# Patient Record
Sex: Female | Born: 1965 | Race: White | Hispanic: No | Marital: Married | Smoking: Never smoker
Health system: Southern US, Community
[De-identification: ages and names within clinical notes are randomized; demographics above are authoritative.]

## PROBLEM LIST (undated history)

## (undated) DIAGNOSIS — M1990 Unspecified osteoarthritis, unspecified site: Secondary | ICD-10-CM

## (undated) DIAGNOSIS — R740 Nonspecific elevation of levels of transaminase and lactic acid dehydrogenase [LDH]: Secondary | ICD-10-CM

## (undated) DIAGNOSIS — I10 Essential (primary) hypertension: Secondary | ICD-10-CM

## (undated) DIAGNOSIS — E785 Hyperlipidemia, unspecified: Secondary | ICD-10-CM

## (undated) DIAGNOSIS — J45909 Unspecified asthma, uncomplicated: Secondary | ICD-10-CM

## (undated) DIAGNOSIS — K219 Gastro-esophageal reflux disease without esophagitis: Secondary | ICD-10-CM

## (undated) DIAGNOSIS — E669 Obesity, unspecified: Secondary | ICD-10-CM

## (undated) DIAGNOSIS — R7402 Elevation of levels of lactic acid dehydrogenase (LDH): Secondary | ICD-10-CM

## (undated) DIAGNOSIS — M3130 Wegener's granulomatosis without renal involvement: Secondary | ICD-10-CM

## (undated) DIAGNOSIS — R7401 Elevation of levels of liver transaminase levels: Secondary | ICD-10-CM

## (undated) DIAGNOSIS — F54 Psychological and behavioral factors associated with disorders or diseases classified elsewhere: Secondary | ICD-10-CM

## (undated) DIAGNOSIS — H35039 Hypertensive retinopathy, unspecified eye: Secondary | ICD-10-CM

## (undated) DIAGNOSIS — J309 Allergic rhinitis, unspecified: Secondary | ICD-10-CM

## (undated) DIAGNOSIS — F419 Anxiety disorder, unspecified: Secondary | ICD-10-CM

## (undated) DIAGNOSIS — E559 Vitamin D deficiency, unspecified: Secondary | ICD-10-CM

## (undated) DIAGNOSIS — N189 Chronic kidney disease, unspecified: Secondary | ICD-10-CM

## (undated) DIAGNOSIS — D649 Anemia, unspecified: Secondary | ICD-10-CM

## (undated) DIAGNOSIS — E039 Hypothyroidism, unspecified: Secondary | ICD-10-CM

## (undated) DIAGNOSIS — H34829 Venous engorgement, unspecified eye: Secondary | ICD-10-CM

## (undated) HISTORY — DX: Essential (primary) hypertension: I10

## (undated) HISTORY — DX: Chronic kidney disease, unspecified: N18.9

## (undated) HISTORY — DX: Anemia, unspecified: D64.9

## (undated) HISTORY — DX: Gastro-esophageal reflux disease without esophagitis: K21.9

## (undated) HISTORY — PX: OTHER SURGICAL HISTORY: SHX169

## (undated) HISTORY — DX: Vitamin D deficiency, unspecified: E55.9

## (undated) HISTORY — DX: Anxiety disorder, unspecified: F41.9

## (undated) HISTORY — PX: TONSILLECTOMY: SUR1361

## (undated) HISTORY — DX: Unspecified asthma, uncomplicated: J45.909

## (undated) HISTORY — DX: Wegener's granulomatosis without renal involvement: M31.30

## (undated) HISTORY — DX: Elevation of levels of lactic acid dehydrogenase (LDH): R74.02

## (undated) HISTORY — DX: Psychological and behavioral factors associated with disorders or diseases classified elsewhere: F54

## (undated) HISTORY — DX: Unspecified osteoarthritis, unspecified site: M19.90

## (undated) HISTORY — DX: Nonspecific elevation of levels of transaminase and lactic acid dehydrogenase (ldh): R74.0

## (undated) HISTORY — DX: Obesity, unspecified: E66.9

## (undated) HISTORY — DX: Hypertensive retinopathy, unspecified eye: H35.039

## (undated) HISTORY — DX: Venous engorgement, unspecified eye: H34.829

## (undated) HISTORY — DX: Allergic rhinitis, unspecified: J30.9

## (undated) HISTORY — DX: Elevation of levels of liver transaminase levels: R74.01

## (undated) HISTORY — DX: Hypothyroidism, unspecified: E03.9

## (undated) HISTORY — DX: Hyperlipidemia, unspecified: E78.5

---

## 1997-11-24 HISTORY — PX: RENAL BIOPSY: SHX156

## 1997-11-24 HISTORY — PX: LUNG BIOPSY: SHX232

## 2005-02-26 ENCOUNTER — Emergency Department: Payer: Self-pay | Admitting: Emergency Medicine

## 2005-02-26 ENCOUNTER — Other Ambulatory Visit: Payer: Self-pay

## 2005-04-14 ENCOUNTER — Ambulatory Visit: Payer: Self-pay | Admitting: Internal Medicine

## 2006-06-17 ENCOUNTER — Ambulatory Visit: Payer: Self-pay | Admitting: Unknown Physician Specialty

## 2006-06-22 ENCOUNTER — Encounter: Payer: Self-pay | Admitting: Cardiovascular Disease

## 2006-07-01 ENCOUNTER — Ambulatory Visit: Payer: Self-pay | Admitting: Unknown Physician Specialty

## 2006-07-06 ENCOUNTER — Ambulatory Visit: Payer: Self-pay | Admitting: Family Medicine

## 2006-07-25 ENCOUNTER — Ambulatory Visit: Payer: Self-pay | Admitting: Family Medicine

## 2006-08-24 ENCOUNTER — Ambulatory Visit: Payer: Self-pay | Admitting: Family Medicine

## 2006-09-24 ENCOUNTER — Ambulatory Visit: Payer: Self-pay | Admitting: Family Medicine

## 2008-02-16 ENCOUNTER — Ambulatory Visit: Payer: Self-pay

## 2008-02-28 ENCOUNTER — Ambulatory Visit: Payer: Self-pay

## 2009-02-27 ENCOUNTER — Ambulatory Visit: Payer: Self-pay | Admitting: Family Medicine

## 2009-07-05 ENCOUNTER — Ambulatory Visit: Payer: Self-pay

## 2009-09-24 ENCOUNTER — Ambulatory Visit: Payer: Self-pay | Admitting: Family Medicine

## 2009-10-24 HISTORY — PX: OTHER SURGICAL HISTORY: SHX169

## 2009-10-30 ENCOUNTER — Other Ambulatory Visit: Payer: Self-pay | Admitting: Ophthalmology

## 2010-06-14 ENCOUNTER — Ambulatory Visit: Payer: Self-pay

## 2010-08-06 ENCOUNTER — Encounter: Payer: Self-pay | Admitting: Cardiovascular Disease

## 2010-09-03 ENCOUNTER — Ambulatory Visit: Payer: Self-pay | Admitting: Unknown Physician Specialty

## 2010-09-10 ENCOUNTER — Ambulatory Visit: Payer: Self-pay | Admitting: Unknown Physician Specialty

## 2010-10-02 ENCOUNTER — Ambulatory Visit: Payer: Self-pay | Admitting: Family Medicine

## 2010-11-07 ENCOUNTER — Encounter: Payer: Self-pay | Admitting: Cardiovascular Disease

## 2010-11-22 ENCOUNTER — Ambulatory Visit: Payer: Self-pay | Admitting: Family Medicine

## 2010-11-24 LAB — HM COLONOSCOPY

## 2010-11-28 ENCOUNTER — Ambulatory Visit
Admission: RE | Admit: 2010-11-28 | Discharge: 2010-11-28 | Payer: Self-pay | Source: Home / Self Care | Attending: Cardiovascular Disease | Admitting: Cardiovascular Disease

## 2010-11-28 ENCOUNTER — Encounter: Payer: Self-pay | Admitting: Cardiovascular Disease

## 2010-11-28 DIAGNOSIS — R079 Chest pain, unspecified: Secondary | ICD-10-CM | POA: Insufficient documentation

## 2010-12-26 NOTE — Assessment & Plan Note (Signed)
Summary: NP6/AMD   Visit Type:  Initial Consult Primary Provider:  Dr. Nilda Simmer  CC:  c/o chest pain. Denies SOB and palpitations.  History of Present Illness: Rachel Walls is a very pleasant 45 year old woman with a history of Wegener's granulomatosis, diabetes, obesity who presents for evaluation of left-sided chest pain.  She reports that she had chest pain several years ago. She was seen at [facility] cardiology and had a echocardiogram that showed very small bowel leaking. She also had a stress test at [facility] in 2008 which was reportedly normal.  She describes her chest pain as 2-3/10 in intensity, lasting for minutes to up to 30 minutes at a time, a dull ache on the left chest above the left breast that comes on at rest as well as sometimes with walking. It has not been getting worse and has been relatively stable.   She is more worried about her left-sided chest pain as she was told that she had an abnormal EKG.  EKG from today shows normal sinus rhythm with rate 91 beats per minute with no significant ST or T wave changes. She does have appropriate R-wave progression through the precordial leads.  old EKG from September 2011 shows normal sinus rhythm with rate of 97 beats per minute, poor R wave progression in the precordial leads, otherwise essentially normal. Borderline Q waves in inferior leads are not seen on today's EKG      Current Medications (verified): 1)  Metformin Hcl 500 Mg Tabs (Metformin Hcl) .Marland Kitchen.. 1 Tablet Every Evening With Food 2)  Prevacid 30 Mg Cpdr (Lansoprazole) .Marland Kitchen.. 1 Packet Every Day 3)  Altace 10 Mg Caps (Ramipril) .Marland Kitchen.. 1 By Mouth Once Daily 4)  Flonase 50 Mcg/act Susp (Fluticasone Propionate) .... 2 Sprays Each Nostril Daily 5)  Loratadine 10 Mg Tabs (Loratadine) .Marland Kitchen.. 1 Tablet Every Day For Allergies 6)  Cellcept 250 Mg Caps (Mycophenolate Mofetil) .Marland Kitchen.. 1 Tablet At Bedtime 7)  Cellcept 500 Mg Tabs (Mycophenolate Mofetil) .Marland Kitchen.. 1 Tablet Every Morning 8)   Levothroid 88 Mcg Tabs (Levothyroxine Sodium) .Marland Kitchen.. 1 Tablet By Mouth Every Day 9)  Symbicort 80-4.5 Mcg/act Aero (Budesonide-Formoterol Fumarate) .... 2 Puffs Two Times A Day 10)  Aleve 220 Mg Tabs (Naproxen Sodium) .... As Needed  Allergies (verified): 1)  ! Penicillin 2)  ! 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  Alcohol Use - no Regular Exercise - no Drug Use - no  Risk Factors: Exercise: no (11/28/2010)  Risk Factors: Smoking Status: never (11/28/2010)  Review of Systems       The patient complains of weight gain and chest pain.  The patient denies fever, weight loss, vision loss, decreased hearing, hoarseness, syncope, dyspnea on exertion, peripheral edema, prolonged cough, abdominal pain, incontinence, muscle weakness, depression, and enlarged lymph nodes.    Vital Signs:  Patient profile:   45 year old female Height:      65 inches Weight:      275.75 pounds BMI:     46.05  Pulse rate:   91 / minute BP sitting:   118 / 68  (left arm) Cuff size:   regular  Vitals Entered By: Lysbeth Galas CMA (November 28, 2010 3:58 PM)   Physical Exam  General:  Well developed, well nourished, in no acute distress. obese Head:  normocephalic and atraumatic Neck:  Neck supple, no JVD. No masses, thyromegaly or abnormal cervical  nodes. Lungs:  Clear bilaterally to auscultation and percussion. Heart:  Non-displaced PMI, chest non-tender; regular rate and rhythm, S1, S2 without murmurs, rubs or gallops. Carotid upstroke normal, no bruit.  Pedals normal pulses. No edema, no varicosities. Abdomen:  Bowel sounds positive; abdomen soft and non-tender without masses Msk:  Back normal, normal gait. Muscle strength and tone normal. Pulses:  pulses normal in all 4 extremities Extremities:  No clubbing or cyanosis. Neurologic:  Alert and oriented x 3. Skin:  Intact without lesions or rashes. Psych:  Normal affect.   Impression & Recommendations:  Problem # 1:  CHEST PAIN UNSPECIFIED (ICD-786.50) chest pain is very atypical in nature. Her EKG is essentially normal on today's visit with nonspecific likely normal changes on previous EKGs. She did have a previous stress test for similar type symptoms in 2008. She is relatively asymptomatic with exertion with no other associated symptoms. We did offer a treadmill study at any point in the future if her symptoms get worse, particularly with exertion. She will call us if she becomes more concerned or has worsening symptoms. She may have a family history of coronary artery disease. Her mother is starting a statin. We'll try to obtain her cholesterol for our review.  I do not think that her Wegener's granulomatosis is playing a role in her left upper chest discomfort, though this is not clear. She is on chronic immunosuppression.  I encouraged her to continue to work on her diet and exercise. we would be happy to see her back on an as needed basis. Thank you for allowing [place] to participate in the care of Rachel Walls.   Her updated medication list for this problem includes:    Altace 10 Mg Caps (Ramipril) .Marland Kitchen... 1 by mouth once daily  Orders: EKG w/ Interpretation (93000)

## 2011-01-01 NOTE — Letter (Signed)
Summary: [facility]  [facility]   Imported By: Marylou Mccoy 12/24/2010 09:45:48  _____________________________________________________________________  External Attachment:    Type:   Image     Comment:   External Document

## 2011-01-16 ENCOUNTER — Encounter: Payer: Self-pay | Admitting: Cardiovascular Disease

## 2011-01-21 NOTE — Letter (Signed)
Summary: [employer] at [facility] [address]   [place], [place] [address]   Phone: [phone]  Fax: [phone]    01/16/2011    [facility] [address] [place], [place]  [address]   Dear Ms. [facility],   Ms. Moroni after reviewing your medical records you are due to have a cholesterol check.  If you would contact our office at [phone] to have the cholesterol scheduled.           Sincerely,    Julien Nordmann, M.D., PHD.

## 2011-06-10 ENCOUNTER — Encounter: Payer: Self-pay | Admitting: Cardiovascular Disease

## 2011-11-04 ENCOUNTER — Ambulatory Visit: Payer: Self-pay | Admitting: Family Medicine

## 2012-01-03 ENCOUNTER — Emergency Department: Payer: Self-pay | Admitting: Emergency Medicine

## 2012-05-17 ENCOUNTER — Ambulatory Visit: Payer: Self-pay | Admitting: Family Medicine

## 2012-05-17 HISTORY — PX: OTHER SURGICAL HISTORY: SHX169

## 2012-06-10 ENCOUNTER — Ambulatory Visit: Payer: Self-pay | Admitting: Family Medicine

## 2012-06-10 HISTORY — PX: OTHER SURGICAL HISTORY: SHX169

## 2012-06-11 ENCOUNTER — Ambulatory Visit: Payer: Self-pay | Admitting: Family Medicine

## 2012-06-11 LAB — HM MAMMOGRAPHY: HM Mammogram: NEGATIVE

## 2012-08-19 ENCOUNTER — Other Ambulatory Visit (HOSPITAL_COMMUNITY): Payer: Self-pay | Admitting: Gastroenterology

## 2012-08-19 DIAGNOSIS — R109 Unspecified abdominal pain: Secondary | ICD-10-CM

## 2012-08-19 HISTORY — PX: ESOPHAGOGASTRODUODENOSCOPY: SHX1529

## 2012-08-27 ENCOUNTER — Encounter (HOSPITAL_COMMUNITY): Payer: Self-pay

## 2012-08-27 ENCOUNTER — Encounter (HOSPITAL_COMMUNITY)
Admission: RE | Admit: 2012-08-27 | Discharge: 2012-08-27 | Disposition: A | Discharge status: Home / Self Care | Payer: BC Managed Care – PPO | Source: Ambulatory Visit | Attending: Gastroenterology | Admitting: Gastroenterology

## 2012-08-27 DIAGNOSIS — R1011 Right upper quadrant pain: Secondary | ICD-10-CM | POA: Insufficient documentation

## 2012-08-27 DIAGNOSIS — R109 Unspecified abdominal pain: Secondary | ICD-10-CM

## 2012-08-27 DIAGNOSIS — R112 Nausea with vomiting, unspecified: Secondary | ICD-10-CM | POA: Insufficient documentation

## 2012-08-27 HISTORY — PX: OTHER SURGICAL HISTORY: SHX169

## 2012-08-27 MED ORDER — TECHNETIUM TC 99M MEBROFENIN IV KIT: 5.5000 | PACK | Freq: Once | INTRAVENOUS | Status: AC | PRN

## 2012-09-09 ENCOUNTER — Encounter: Payer: Self-pay | Admitting: *Deleted

## 2012-09-13 ENCOUNTER — Ambulatory Visit (INDEPENDENT_AMBULATORY_CARE_PROVIDER_SITE_OTHER): Payer: BC Managed Care – PPO | Admitting: Family Medicine

## 2012-09-13 ENCOUNTER — Encounter: Payer: Self-pay | Admitting: Family Medicine

## 2012-09-13 VITALS — BP 108/76 | HR 79 | Temp 98.9°F | Resp 16 | Ht 65.0 in | Wt 248.0 lb

## 2012-09-13 DIAGNOSIS — E119 Type 2 diabetes mellitus without complications: Secondary | ICD-10-CM

## 2012-09-13 DIAGNOSIS — E1142 Type 2 diabetes mellitus with diabetic polyneuropathy: Secondary | ICD-10-CM | POA: Insufficient documentation

## 2012-09-13 DIAGNOSIS — E539 Vitamin B deficiency, unspecified: Secondary | ICD-10-CM

## 2012-09-13 DIAGNOSIS — K2970 Gastritis, unspecified, without bleeding: Secondary | ICD-10-CM

## 2012-09-13 DIAGNOSIS — E559 Vitamin D deficiency, unspecified: Secondary | ICD-10-CM | POA: Insufficient documentation

## 2012-09-13 DIAGNOSIS — J309 Allergic rhinitis, unspecified: Secondary | ICD-10-CM | POA: Insufficient documentation

## 2012-09-13 DIAGNOSIS — E039 Hypothyroidism, unspecified: Secondary | ICD-10-CM

## 2012-09-13 DIAGNOSIS — M3130 Wegener's granulomatosis without renal involvement: Secondary | ICD-10-CM

## 2012-09-13 DIAGNOSIS — I10 Essential (primary) hypertension: Secondary | ICD-10-CM | POA: Insufficient documentation

## 2012-09-13 DIAGNOSIS — E7800 Pure hypercholesterolemia, unspecified: Secondary | ICD-10-CM

## 2012-09-13 DIAGNOSIS — E611 Iron deficiency: Secondary | ICD-10-CM

## 2012-09-13 DIAGNOSIS — R160 Hepatomegaly, not elsewhere classified: Secondary | ICD-10-CM

## 2012-09-13 DIAGNOSIS — R1110 Vomiting, unspecified: Secondary | ICD-10-CM

## 2012-09-13 LAB — VITAMIN B12: Vitamin B-12: 577 pg/mL (ref 211–911)

## 2012-09-13 LAB — T4, FREE: Free T4: 1.35 ng/dL (ref 0.80–1.80)

## 2012-09-13 LAB — COMPREHENSIVE METABOLIC PANEL
Alkaline Phosphatase: 53 U/L (ref 39–117)
BUN: 14 mg/dL (ref 6–23)
CO2: 28 mEq/L (ref 19–32)
Glucose, Bld: 113 mg/dL — ABNORMAL HIGH (ref 70–99)
Sodium: 139 mEq/L (ref 135–145)
Total Bilirubin: 0.5 mg/dL (ref 0.3–1.2)
Total Protein: 6.8 g/dL (ref 6.0–8.3)

## 2012-09-13 LAB — TSH: TSH: 3.475 u[IU]/mL (ref 0.350–4.500)

## 2012-09-13 LAB — CBC WITH DIFFERENTIAL/PLATELET
Basophils Absolute: 0 10*3/uL (ref 0.0–0.1)
Basophils Relative: 0 % (ref 0–1)
Eosinophils Absolute: 0.6 10*3/uL (ref 0.0–0.7)
Eosinophils Relative: 6 % — ABNORMAL HIGH (ref 0–5)
HCT: 37.1 % (ref 36.0–46.0)
Hemoglobin: 12.2 g/dL (ref 12.0–15.0)
MCH: 24.5 pg — ABNORMAL LOW (ref 26.0–34.0)
MCHC: 32.9 g/dL (ref 30.0–36.0)
MCV: 74.5 fL — ABNORMAL LOW (ref 78.0–100.0)
Monocytes Absolute: 0.8 10*3/uL (ref 0.1–1.0)
Monocytes Relative: 8 % (ref 3–12)
Neutro Abs: 6.5 10*3/uL (ref 1.7–7.7)
RDW: 14.8 % (ref 11.5–15.5)

## 2012-09-13 LAB — LIPID PANEL
Cholesterol: 184 mg/dL (ref 0–200)
HDL: 39 mg/dL — ABNORMAL LOW (ref >39)
Triglycerides: 163 mg/dL — ABNORMAL HIGH (ref <150)
VLDL: 33 mg/dL (ref 0–40)

## 2012-09-13 MED ORDER — RAMIPRIL 10 MG PO CAPS: 10.0000 mg | ORAL_CAPSULE | Freq: Every day | ORAL | Status: DC

## 2012-09-13 MED ORDER — FLUTICASONE PROPIONATE 50 MCG/ACT NA SUSP: 2.0000 | Freq: Every day | NASAL | Status: DC

## 2012-09-13 MED ORDER — LEVOTHYROXINE SODIUM 100 MCG PO TABS: 100.0000 ug | ORAL_TABLET | Freq: Every day | ORAL | Status: DC

## 2012-09-13 MED ORDER — METFORMIN HCL 500 MG PO TABS: 500.0000 mg | ORAL_TABLET | Freq: Every evening | ORAL | Status: DC

## 2012-09-13 NOTE — Assessment & Plan Note (Signed)
Stable; obtain labs; yet out of medication for past five days; refills provided.

## 2012-09-13 NOTE — Assessment & Plan Note (Signed)
Uncontrolled.  Twenty pound weight loss since last visit due to GI issues with diarrhea, vomiting.  Increased Metformin to 1000mg  bid one month before onset of GI symptoms; thus, will decrease Metformin to 500mg  bid to determine if current GI symptoms secondary to Metformin.  Obtain labs.  S/p flu vaccine at work.

## 2012-09-13 NOTE — Progress Notes (Signed)
[address]   [place], [place]  [address]   [phone]  Subjective:    Patient ID: Rachel Walls, female    DOB: November 28, 1965, 46 y.o.   MRN: [id number]  HPIThis 46 y.o. female presents to establish care and for follow-up:  1. Vomiting, diarrhea:  onsest 04/2012.  S/p labs normal; has episodes of vomiting, diarrhea.  S/p abdominal u/s +hepatomegaly.  S/p CT scan:  Hepatomegaly.  S/p EGD:  +gastritis; normal esophagus; normal duodenum.  Biopsies negative for H. Pylori, malignancy.  HIDA scan: normal; EF 62%.  Follow-up with Iftikhar's office; has taken Prevacid for years but wants to switch to Nexium.  Went to pick up Nexium $64; buying Prevacid for $5 with insurance.  Gallbladder studies all normal.  If has attack, present to  ED for possible evaluation by general surgery.  Lost 20 pounds in three months due to symptoms.  Worse with greasy foods, fried foods.  Breads do not bother.  Having one episode every 3-4 weeks currently.  Having abdominal pain in RUQ, epigastric regions.  2.  Hypothyroidism:  Six month follow-up; no changes to medication at last visit; compliance with medication until 5 days ago; ran out of medication; denies excessive fatigue; +weight changes; no skin or hair changes.    3.  DMII:  Three month follow-up; HgbA1c at last visit of 7.5; no changes to medication or management at last visit other than encouraging compliance with diabetic diet.  Not checking blood sugars at home.  Taking Metformin 1000mg  bid.  Has been having GI issues for past six months and has lost 20 pounds due to alterations in diet.    4.  HTN:  Three month follow-up; no changes to management made at last visit.  Report good compliance to management, good tolerance, good symptom control.  Denies chest pain, palpitations, shortness of breath, leg swelling.  Intermittent dizziness.  Does not check BP at home. Has wrist monitor at home.  5.  Vitamin D deficiency:  Six month follow-up; Vitamin D level low at  CPE in 02/2012.  6.  Vitamin B12 deficiency:  Six month follow-up; vitamin B12 level borderline low at CPE in 02/2012.  7.  Iron levels low:    Six month follow-up; iron levels slightly low at CPE in 02/2012.  No associated anemia.  8.  Sinus congestion: presented to [facility] two weeks ago for sinus pressure, ear pressure, congestion.  Prescribed Flonase with improvement.  No recent fever/chills/sweats.  No headaches; no further ear pain.  Rhinorrhea and nasal congestion improved; does not want to get too dry due to history of epistaxis warranting seven day packing.  No cough.  +PND.      Review of Systems  Constitutional: Positive for unexpected weight change. Negative for fever, diaphoresis, activity change, appetite change and fatigue.  HENT: Positive for congestion, rhinorrhea, sneezing, dental problem and postnasal drip. Negative for ear pain, nosebleeds, sore throat, trouble swallowing and voice change.   Eyes: Negative for photophobia and visual disturbance.  Respiratory: Negative for cough and shortness of breath.   Cardiovascular: Negative for chest pain, palpitations and leg swelling.  Gastrointestinal: Positive for nausea, vomiting, abdominal pain and diarrhea. Negative for constipation, blood in stool and abdominal distention.  Skin: Negative for color change, rash and wound.  Neurological: Positive for dizziness. Negative for weakness, light-headedness, numbness and headaches.    Past Medical History  Diagnosis Date  . Wegener's granulomatosis   . GERD (gastroesophageal reflux disease)   .  Hypothyroidism   . Diabetes mellitus     Type 2  . Psychic factors associated with disease     Psychogenic factors with other diseases  . Hypertension   . Nonspecific elevation of levels of transaminase or lactic acid dehydrogenase (LDH)   . Other and unspecified hyperlipidemia   . Allergic rhinitis, cause unspecified   . Unspecified vitamin D deficiency   . Obesity,  unspecified   . Hypertensive retinopathy   . Venous engorgement of retina   . Chronic kidney disease     Past Surgical History  Procedure Date  . Calcaneous bone spur surgery     x2  . Tonsillectomy   . Renal biopsy 1999  . Lung biopsy 1999  . Retinal vein occlusion 12/10    Left, vision loss    Prior to Admission medications   Medication Sig Start Date End Date Taking? Authorizing Provider  cholecalciferol (VITAMIN D) 1000 UNITS tablet Take 1,000 Units by mouth daily.   Yes Historical Provider, MD  fluticasone (FLONASE) 50 MCG/ACT nasal spray Place 2 sprays into the nose daily.     Yes Historical Provider, MD  gabapentin (NEURONTIN) 100 MG capsule Take 800 mg by mouth at bedtime.    Yes Historical Provider, MD  lansoprazole (PREVACID) 30 MG capsule Take 30 mg by mouth daily.     Yes Historical Provider, MD  levothyroxine (SYNTHROID, LEVOTHROID) 100 MCG tablet Take 1 tablet (100 mcg total) by mouth daily. 09/13/12  Yes Ethelda Chick, MD  loratadine (CLARITIN) 10 MG tablet Take 10 mg by mouth daily.     Yes Historical Provider, MD  metFORMIN (GLUCOPHAGE) 500 MG tablet Take 1 tablet (500 mg total) by mouth every evening. With food. 09/13/12  Yes Ethelda Chick, MD  Multiple Vitamin (STRESSTABS PO) Take 1 tablet by mouth daily.   Yes Historical Provider, MD  mycophenolate (CELLCEPT) 250 MG capsule Take 250 mg by mouth at bedtime. 2 tab in the morning & 1 tab at night.   Yes Historical Provider, MD  ramipril (ALTACE) 10 MG capsule Take 10 mg by mouth daily.     Yes Historical Provider, MD  budesonide-formoterol (SYMBICORT) 80-4.5 MCG/ACT inhaler Inhale 2 puffs into the lungs 2 (two) times daily.      Historical Provider, MD  meloxicam (MOBIC) 15 MG tablet Take 15 mg by mouth daily.    Historical Provider, MD  mycophenolate (CELLCEPT) 500 MG tablet Take 500 mg by mouth every morning.      Historical Provider, MD  naproxen sodium (ANAPROX) 220 MG tablet Take 220 mg by mouth as needed.       Historical Provider, MD    Allergies  Allergen Reactions  . Penicillins   . Sulfonamide Derivatives     History   Social History  . Marital Status: Married    Spouse Name: N/A    Number of Children: 0  . Years of Education: college   Occupational History  . TEACHER     Full time   Social History Main Topics  . Smoking status: Never Smoker   . Smokeless tobacco: Not on file   Comment: Tobacco use-no  . Alcohol Use: No  . Drug Use: No  . Sexually Active: Not on file   Other Topics Concern  . Not on file   Social History Narrative   No regular exercise. Always uses seat belts. Smoke alarm in the home. Caffeine use: None. Married x 22 years;happily married; no abuse.  Family History  Problem Relation Age of Onset  . Hyperlipidemia Mother     Hypercholesterolemia  . Sarcoidosis Father   . Heart disease Maternal Grandfather   . Emphysema Maternal Grandfather     Cause of death  . Diabetes Maternal Grandfather   . Heart failure Paternal Grandfather     CHF  . Heart disease Paternal Grandfather   . Asthma Brother   . Alcohol abuse Brother   . Emphysema Maternal Grandmother         Objective:   Physical Exam  Nursing note and vitals reviewed. Constitutional: She is oriented to person, place, and time. She appears well-developed and well-nourished. No distress.  HENT:  Head: Normocephalic and atraumatic.  Right Ear: External ear normal.  Left Ear: External ear normal.  Nose: Nose normal.  Mouth/Throat: Oropharynx is clear and moist.  Eyes: Conjunctivae normal and EOM are normal. Pupils are equal, round, and reactive to light.  Neck: Normal range of motion. Neck supple. No thyromegaly present.  Cardiovascular: Normal rate, regular rhythm, normal heart sounds and intact distal pulses.   No murmur heard. Pulmonary/Chest: Effort normal and breath sounds normal. No respiratory distress. She has no wheezes. She has no rales.  Abdominal: Soft. Bowel sounds are  normal. She exhibits no distension and no mass. There is tenderness in the right upper quadrant and epigastric area. There is no rebound and no guarding.  Lymphadenopathy:    She has no cervical adenopathy.  Neurological: She is alert and oriented to person, place, and time.  Skin: Skin is warm and dry. No rash noted. She is not diaphoretic. No erythema. No pallor.  Psychiatric: She has a normal mood and affect. Her behavior is normal. Judgment and thought content normal.        Assessment & Plan:   1. Type II or unspecified type diabetes mellitus without mention of complication, not stated as uncontrolled  CBC with Differential, Hemoglobin A1c, metFORMIN (GLUCOPHAGE) 500 MG tablet  2. Essential hypertension, benign  CK, Comprehensive metabolic panel  3. Pure hypercholesterolemia  Lipid panel  4. Unspecified hypothyroidism  T4, free, TSH, levothyroxine (SYNTHROID, LEVOTHROID) 100 MCG tablet  5. Unspecified vitamin D deficiency  Vitamin D 25 hydroxy  6. Vitamin B deficiency  Vitamin B12  7. Low iron  Iron  8. Gastritis    9. Vomiting and diarrhea    10. Hepatomegaly    11. Allergic rhinitis    12. Wegener's granulomatosis

## 2012-09-13 NOTE — Assessment & Plan Note (Signed)
New to this provider; will warrant Hepatitis A and B vaccines in future if not previously received to prevent further hepatic insult.

## 2012-09-13 NOTE — Assessment & Plan Note (Signed)
New since CPE in 02/2012; repeat labs today; no associated anemia.  Asymptomatic.

## 2012-09-13 NOTE — Assessment & Plan Note (Signed)
New.  GI wants pt to switch Prevacid to Nexium but Nexium expensive.  Consider trial of Dexilant; insurance may cover better.  Recommend contacting Iftikhar for recommendations; may warrant Nexium or Dexilant for next 2-3 months.

## 2012-09-13 NOTE — Assessment & Plan Note (Signed)
Worsening in past month; continue flonase daily; also recommend adding nasal saline every morning; also continue humidifier in bedroom qhs.

## 2012-09-13 NOTE — Assessment & Plan Note (Signed)
New to this provider. Onset of symptoms one month after increasing Metformin dose; to decrease Metformin dose to 500mg  bid and may warrant HOLDING Metformin for 2-3 weeks.  To also discuss with GI Iftikhar regarding switching Prevacid to Nexium or Dexilant.  Obtain labs.

## 2012-09-13 NOTE — Patient Instructions (Addendum)
1. Type II or unspecified type diabetes mellitus without mention of complication, not stated as uncontrolled  CBC with Differential, Hemoglobin A1c, metFORMIN (GLUCOPHAGE) 500 MG tablet  2. Essential hypertension, benign  CK, Comprehensive metabolic panel  3. Pure hypercholesterolemia  Lipid panel  4. Unspecified hypothyroidism  T4, free, TSH, levothyroxine (SYNTHROID, LEVOTHROID) 100 MCG tablet  5. Unspecified vitamin D deficiency  Vitamin D 25 hydroxy  6. Vitamin B deficiency  Vitamin B12  7. Low iron  Iron

## 2012-09-13 NOTE — Assessment & Plan Note (Signed)
New at CPE in 02/2012.  Repeat levels today; asymptomatic.  Chronic PPI use.

## 2012-09-13 NOTE — Assessment & Plan Note (Signed)
Controlled/improved with recent weight loss; readings actually borderline low; to start checking BP daily at home; to call if BP< 100 systolic.  No changes to management today; obtain labs.

## 2012-09-13 NOTE — Assessment & Plan Note (Signed)
Uncontrolled; repeat labs today.  Continue supplementation.

## 2012-09-13 NOTE — Assessment & Plan Note (Signed)
Improved at last visit; repeat today; recent weight loss.

## 2012-09-15 ENCOUNTER — Encounter: Payer: Self-pay | Admitting: Family Medicine

## 2012-09-22 ENCOUNTER — Ambulatory Visit: Payer: Self-pay | Admitting: Physician Assistant

## 2012-09-29 ENCOUNTER — Telehealth: Payer: Self-pay | Admitting: Family Medicine

## 2012-10-04 ENCOUNTER — Encounter: Payer: Self-pay | Admitting: Family Medicine

## 2012-10-06 NOTE — Telephone Encounter (Signed)
Please create a work note for patient excusing her from work on day of recent visit; I will sign if you will generate note.  Thanks!

## 2012-10-06 NOTE — Progress Notes (Unsigned)
Dr Katrinka Blazing, pt had sent a separate email yesterday asking for a note to be emailed (MyChart) back to her concerning the office visit. I emailed her the note and also generated a letter for her to p/up. After reading this email request, I mailed her the printed letter.

## 2012-12-08 ENCOUNTER — Ambulatory Visit (INDEPENDENT_AMBULATORY_CARE_PROVIDER_SITE_OTHER): Payer: BC Managed Care – PPO | Admitting: Family Medicine

## 2012-12-08 ENCOUNTER — Other Ambulatory Visit: Payer: Self-pay | Admitting: Family Medicine

## 2012-12-08 VITALS — BP 124/76 | HR 63 | Temp 99.0°F | Resp 16 | Ht 64.25 in | Wt 247.2 lb

## 2012-12-08 DIAGNOSIS — J329 Chronic sinusitis, unspecified: Secondary | ICD-10-CM

## 2012-12-08 DIAGNOSIS — R51 Headache: Secondary | ICD-10-CM

## 2012-12-08 DIAGNOSIS — E039 Hypothyroidism, unspecified: Secondary | ICD-10-CM

## 2012-12-08 LAB — POCT CBC
Granulocyte percent: 64.3 %G (ref 37–80)
HCT, POC: 38 % (ref 37.7–47.9)
Hemoglobin: 11.6 g/dL — AB (ref 12.2–16.2)
Lymph, poc: 2.8 (ref 0.6–3.4)
MCHC: 30.5 g/dL — AB (ref 31.8–35.4)
MPV: 8.5 fL (ref 0–99.8)
POC Granulocyte: 6.2 (ref 2–6.9)
POC MID %: 7 %M (ref 0–12)

## 2012-12-08 LAB — TSH: TSH: 1.108 u[IU]/mL (ref 0.350–4.500)

## 2012-12-08 MED ORDER — AZITHROMYCIN 250 MG PO TABS: ORAL_TABLET | ORAL | Status: DC

## 2012-12-08 NOTE — Progress Notes (Signed)
[facility] [address] [place] [address] [phone]  Date:  12/08/2012   Name:  Rachel Walls   DOB:  June 11, 1966   MRN:  [id number]  PCP:  Nilda Simmer, MD    Chief Complaint: Sinusitis   History of Present Illness:  Rachel Walls is a 47 y.o. very pleasant female patient who presents with the following:  She is here today with a headache- she is not sure if she has a sinus infection.  She has noted pain in her face/ behind her eyes, her teeth hurt and her ears hurt intermittently as well.  However, she is not blowing anything out of her nose.  She is using flonase but it is not helping.  This feels like a sinus infection to her except she is not having nasal discharge.    She tried advil this morning but it did not help She has note noted a fever- no particular chills but "I feel cold all the time" for the last couple of months. She has not had her TSH checked since this problem began She does not note body aches  She has intermittent GI upset, diarrhea/ vomiting.  This has gone on for the last 7 or 8 months.  This has not changed with this illness.  This is currently under investigation but the cause is not yet known.    Patient Active Problem List  Diagnosis  . CHEST PAIN UNSPECIFIED  . Type II or unspecified type diabetes mellitus without mention of complication, not stated as uncontrolled  . Essential hypertension, benign  . Pure hypercholesterolemia  . Unspecified hypothyroidism  . Gastritis  . Vomiting and diarrhea  . Hepatomegaly  . Allergic rhinitis  . Wegener's granulomatosis  . Unspecified vitamin D deficiency  . Low iron  . Vitamin B deficiency    Past Medical History  Diagnosis Date  . Wegener's granulomatosis   . GERD (gastroesophageal reflux disease)   . Hypothyroidism   . Diabetes mellitus     Type 2  . Psychic factors associated with disease     Psychogenic factors with other diseases  . Hypertension   .  Nonspecific elevation of levels of transaminase or lactic acid dehydrogenase (LDH)   . Other and unspecified hyperlipidemia   . Allergic rhinitis, cause unspecified   . Unspecified vitamin D deficiency   . Obesity, unspecified   . Hypertensive retinopathy   . Venous engorgement of retina   . Chronic kidney disease     Past Surgical History  Procedure Date  . Calcaneous bone spur surgery     x2  . Tonsillectomy   . Renal biopsy 1999  . Lung biopsy 1999  . 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 Abdominal ultrasound 05/17/2012    severe hepatomegaly at 22 cm; gallbladder normal.   [facility].  . Esophagogastroduodenoscopy 08/19/12    diffuse gastritis antrum; pathology negative for malignancy, H. Pylori; esophagus and duodenum normal.  . Ct abdomen 06/10/12    Hepatomegaly.  [facility].  Gery Pray scan 08/27/12    normal; EF 62%.  [facility].    History  Substance Use Topics  . Smoking status: Never Smoker   . Smokeless tobacco: Never Used     Comment: Tobacco use-no  . Alcohol Use: No    Family History  Problem Relation Age of Onset  . Hyperlipidemia Mother     Hypercholesterolemia  . Sarcoidosis Father   . Heart  disease Maternal Grandfather   . Emphysema Maternal Grandfather     Cause of death  . Diabetes Maternal Grandfather   . Heart failure Paternal Grandfather     CHF  . Heart disease Paternal Grandfather   . Asthma Brother   . Alcohol abuse Brother   . Emphysema Maternal Grandmother     Allergies  Allergen Reactions  . Penicillins   . Sulfonamide Derivatives     Medication list has been reviewed and updated.  Current Outpatient Prescriptions on File Prior to Visit  Medication Sig Dispense Refill  . budesonide-formoterol (SYMBICORT) 80-4.5 MCG/ACT inhaler Inhale 2 puffs into the lungs 2 (two) times daily.        . fluticasone (FLONASE) 50 MCG/ACT nasal spray Place 2 sprays into the nose daily.  16 g  11  . gabapentin (NEURONTIN) 100 MG capsule Take  800 mg by mouth at bedtime.       . lansoprazole (PREVACID) 30 MG capsule Take 30 mg by mouth daily.        Marland Kitchen levothyroxine (SYNTHROID, LEVOTHROID) 100 MCG tablet Take 1 tablet (100 mcg total) by mouth daily.  30 tablet  11  . loratadine (CLARITIN) 10 MG tablet Take 10 mg by mouth daily.        . meloxicam (MOBIC) 15 MG tablet Take 15 mg by mouth daily.      . metFORMIN (GLUCOPHAGE) 500 MG tablet Take 1 tablet (500 mg total) by mouth every evening. With food.  60 tablet  5  . Multiple Vitamin (STRESSTABS PO) Take 1 tablet by mouth daily.      . mycophenolate (CELLCEPT) 250 MG capsule Take 250 mg by mouth at bedtime. 2 tab in the morning & 1 tab at night.      . ramipril (ALTACE) 10 MG capsule Take 1 capsule (10 mg total) by mouth daily.  30 capsule  5  . cholecalciferol (VITAMIN D) 1000 UNITS tablet Take 1,000 Units by mouth daily.      . mycophenolate (CELLCEPT) 500 MG tablet Take 500 mg by mouth every morning.        . naproxen sodium (ANAPROX) 220 MG tablet Take 220 mg by mouth as needed.          Review of Systems:  As per HPI- otherwise negative.   Physical Examination: Filed Vitals:   12/08/12 1215  BP: 124/76  Pulse: 63  Temp: 99 F (37.2 C)  Resp: 16   Filed Vitals:   12/08/12 1215  Height: 5' 4.25" (1.632 m)  Weight: 247 lb 3.2 oz (112.129 kg)   Body mass index is 42.10 kg/(m^2). Ideal Body Weight: Weight in (lb) to have BMI = 25: 146.5   GEN: WDWN, NAD, Non-toxic, A & O x 3, obese HEENT: Atraumatic, Normocephalic. Neck supple. No masses, No LAD. Bilateral TM wnl, oropharynx normal.  PEERL,EOMI.  Frontal sinus tenderness, nasal cavity erythema Ears and Nose: No external deformity. CV: RRR, No M/G/R. No JVD. No thrill. No extra heart sounds. PULM: CTA B, no wheezes, crackles, rhonchi. No retractions. No resp. distress. No accessory muscle use. ABD: S, NT, ND, +BS. No rebound. No HSM. EXTR: No c/c/e NEURO Normal gait.  Normal strength, sensation, DTR all  extremities PSYCH: Normally interactive. Conversant. Not depressed or anxious appearing.  Calm demeanor.   Results for orders placed in visit on 12/08/12  POCT CBC      Component Value Range   WBC 9.6  4.6 - 10.2 K/uL  Lymph, poc 2.8  0.6 - 3.4   POC LYMPH PERCENT 28.7  10 - 50 %L   MID (cbc) 0.7  0 - 0.9   POC MID % 7.0  0 - 12 %M   POC Granulocyte 6.2  2 - 6.9   Granulocyte percent 64.3  37 - 80 %G   RBC 4.84  4.04 - 5.48 M/uL   Hemoglobin 11.6 (*) 12.2 - 16.2 g/dL   HCT, POC 16.1  09.6 - 47.9 %   MCV 78.6 (*) 80 - 97 fL   MCH, POC 24.0 (*) 27 - 31.2 pg   MCHC 30.5 (*) 31.8 - 35.4 g/dL   RDW, POC 04.5     Platelet Count, POC 424  142 - 424 K/uL   MPV 8.5  0 - 99.8 fL   Discussed doing a CT as her symptoms are not quite like a usual sinus infection for her.  However, she states that she does NOT have the worst HA of her life and she prefers not to have a CT if not absolutely necessary.  Will try abx therapy first Assessment and Plan: 1. Headache  POCT CBC  2. Hypothyroidism  TSH  3. Sinusitis  azithromycin (ZITHROMAX) 250 MG tablet   Fany states that she has taken a zpack in combination with her other medications in the past and done well.  There is a chance of decreasing her cellcept level.  However, this is common amongst several other antbiotics as well.  Explained that there is an increased risk of cardiac arrythmia in combination with her symbicort- however this risk is likely very small and she had a normal K in October/ normal EKG on file.  Will have a K level checked from blood in lab if possible.    If she is not better in the next 2 days will consider a CT scan.  She will call if I do not contact her first with the rest of her labs.    Abbe Amsterdam, MD

## 2012-12-08 NOTE — Patient Instructions (Addendum)
Please let me know if you are not better in the next 24- 36 hours.  If your headache gets a lot worse or you have any other issues please let me know sooner

## 2012-12-09 ENCOUNTER — Telehealth: Payer: Self-pay | Admitting: Radiology

## 2012-12-09 LAB — POTASSIUM: Potassium: 4.6 mEq/L (ref 3.5–5.3)

## 2012-12-09 NOTE — Telephone Encounter (Signed)
This is given to [facility] in the lab she will have [facility] add this on.

## 2012-12-09 NOTE — Telephone Encounter (Signed)
Message copied by Caffie Damme on Thu Dec 09, 2012  9:32 AM ------      Message from: Abbe Amsterdam C      Created: Wed Dec 08, 2012  6:52 PM       Can we add a K level to her labs?  Thanks!!

## 2012-12-10 ENCOUNTER — Encounter: Payer: Self-pay | Admitting: Family Medicine

## 2012-12-22 ENCOUNTER — Ambulatory Visit: Payer: BC Managed Care – PPO | Admitting: Family Medicine

## 2012-12-30 ENCOUNTER — Telehealth: Payer: Self-pay | Admitting: *Deleted

## 2012-12-30 MED ORDER — LANSOPRAZOLE 30 MG PO CPDR: 30.0000 mg | DELAYED_RELEASE_CAPSULE | Freq: Every day | ORAL | Status: DC

## 2012-12-30 NOTE — Telephone Encounter (Signed)
Done

## 2012-12-30 NOTE — Telephone Encounter (Signed)
[facility] requesting refill on prevacid 15mg .  Last fill 11/26/12

## 2013-01-05 ENCOUNTER — Ambulatory Visit: Payer: Self-pay | Admitting: Family Medicine

## 2013-01-08 ENCOUNTER — Other Ambulatory Visit: Payer: Self-pay

## 2013-01-11 ENCOUNTER — Encounter: Payer: Self-pay | Admitting: Family Medicine

## 2013-01-11 ENCOUNTER — Other Ambulatory Visit: Payer: Self-pay | Admitting: Family Medicine

## 2013-01-11 ENCOUNTER — Ambulatory Visit (INDEPENDENT_AMBULATORY_CARE_PROVIDER_SITE_OTHER): Payer: BC Managed Care – PPO | Admitting: Family Medicine

## 2013-01-11 VITALS — BP 110/78 | HR 68 | Temp 98.2°F | Resp 16 | Ht 64.75 in | Wt 252.4 lb

## 2013-01-11 DIAGNOSIS — M79671 Pain in right foot: Secondary | ICD-10-CM

## 2013-01-11 DIAGNOSIS — M3130 Wegener's granulomatosis without renal involvement: Secondary | ICD-10-CM

## 2013-01-11 DIAGNOSIS — F419 Anxiety disorder, unspecified: Secondary | ICD-10-CM

## 2013-01-11 DIAGNOSIS — E119 Type 2 diabetes mellitus without complications: Secondary | ICD-10-CM

## 2013-01-11 DIAGNOSIS — E039 Hypothyroidism, unspecified: Secondary | ICD-10-CM

## 2013-01-11 DIAGNOSIS — E559 Vitamin D deficiency, unspecified: Secondary | ICD-10-CM

## 2013-01-11 DIAGNOSIS — R197 Diarrhea, unspecified: Secondary | ICD-10-CM

## 2013-01-11 DIAGNOSIS — R160 Hepatomegaly, not elsewhere classified: Secondary | ICD-10-CM

## 2013-01-11 DIAGNOSIS — I10 Essential (primary) hypertension: Secondary | ICD-10-CM

## 2013-01-11 DIAGNOSIS — J0100 Acute maxillary sinusitis, unspecified: Secondary | ICD-10-CM

## 2013-01-11 DIAGNOSIS — E1142 Type 2 diabetes mellitus with diabetic polyneuropathy: Secondary | ICD-10-CM

## 2013-01-11 DIAGNOSIS — E7800 Pure hypercholesterolemia, unspecified: Secondary | ICD-10-CM

## 2013-01-11 LAB — LIPID PANEL
HDL: 46 mg/dL (ref >39)
LDL Cholesterol: 91 mg/dL (ref 0–99)
Total CHOL/HDL Ratio: 4 Ratio
Triglycerides: 247 mg/dL — ABNORMAL HIGH (ref <150)
VLDL: 49 mg/dL — ABNORMAL HIGH (ref 0–40)

## 2013-01-11 LAB — COMPREHENSIVE METABOLIC PANEL
ALT: 12 U/L (ref 0–35)
AST: 14 U/L (ref 0–37)
Alkaline Phosphatase: 62 U/L (ref 39–117)
CO2: 25 mEq/L (ref 19–32)
Creat: 0.82 mg/dL (ref 0.50–1.10)
Sodium: 140 mEq/L (ref 135–145)
Total Bilirubin: 0.3 mg/dL (ref 0.3–1.2)
Total Protein: 6.8 g/dL (ref 6.0–8.3)

## 2013-01-11 LAB — CBC WITH DIFFERENTIAL/PLATELET
Basophils Absolute: 0 10*3/uL (ref 0.0–0.1)
Basophils Relative: 1 % (ref 0–1)
Eosinophils Absolute: 0.4 10*3/uL (ref 0.0–0.7)
Eosinophils Relative: 4 % (ref 0–5)
Lymphs Abs: 1.9 10*3/uL (ref 0.7–4.0)
MCH: 24.1 pg — ABNORMAL LOW (ref 26.0–34.0)
MCV: 77 fL — ABNORMAL LOW (ref 78.0–100.0)
Neutrophils Relative %: 65 % (ref 43–77)
Platelets: 336 10*3/uL (ref 150–400)
RBC: 4.74 MIL/uL (ref 3.87–5.11)
RDW: 15.9 % — ABNORMAL HIGH (ref 11.5–15.5)

## 2013-01-11 LAB — HEMOGLOBIN A1C: Hgb A1c MFr Bld: 6.9 % — ABNORMAL HIGH (ref <5.7)

## 2013-01-11 LAB — TSH: TSH: 1.845 u[IU]/mL (ref 0.350–4.500)

## 2013-01-11 MED ORDER — DULOXETINE HCL 60 MG PO CPEP: 60.0000 mg | ORAL_CAPSULE | Freq: Two times a day (BID) | ORAL | Status: DC

## 2013-01-11 MED ORDER — AZITHROMYCIN 250 MG PO TABS: ORAL_TABLET | ORAL | Status: DC

## 2013-01-11 MED ORDER — IPRATROPIUM BROMIDE 0.03 % NA SOLN: 2.0000 | Freq: Two times a day (BID) | NASAL | Status: DC

## 2013-01-11 MED ORDER — GABAPENTIN 800 MG PO TABS: 800.0000 mg | ORAL_TABLET | Freq: Three times a day (TID) | ORAL | Status: DC

## 2013-01-11 NOTE — Progress Notes (Signed)
[address]   [place], [place]  [address]   [phone]  Subjective:    Patient ID: Rachel Walls, female    DOB: 08-26-1966, 47 y.o.   MRN: [id number]  HPI This 47 y.o. female presents for evaluation for the following:  1.  Vomiting with diarrhea: last episode 10/2012.  Still being cautious with foods.  Etiology unclear. Bland diet not good for sugars.   Sugars running higher than goal.  2. DMII:  Sugars ranging less than 200.  Taking Metformin 500mg  once daily.  Pt thinks that diarrhea secondary to Metformin.  Not being consistent with daily Metformin.    3. Feet issues:  Nerve endings can be affected from DMII and vasculitis.  Currently on 2400mg  Gabapentin for neuropathy.  No feet issues at night.  During the day, suffering with pain with ambulation/standing .  Cannot wear certain shoes during the day.  If wears new balance sneakers, feels great for one hour.  With weight bearing, has prickly sensation, numbness.  Also think that toe joint issues going on.  Initially went with Gabapentin due to burning in feet at night.  Immediately upon awakening, symptoms start.   Chronic feet issues; history of spurs.  Pain feels like neuropathy.  Most of the time it is a painful numb.  Takes all Gabapentin at night due to hypersomnolence from Gabapentin.    4.  Doctor issues:  Received letter last week from Dr. Terie Purser; has been with Terie Purser for 13 years.  Has been assigned to new rheumatologist but this MD is assigned to nephrology/HTN clinic.  Younger MD who may have newer suggestions.    5.  Liver issues:  Had been trying to get pt into liver doctor at [facility]; hepatomegaly; GI physician felt enlarged liver was chronic; worried about hepatomegaly; not a medication side effect.  Not scheduling appointment at Dr. Salvadore Dom office; needs referral to liver specialist at [facility]; Dr. Terie Purser worried that hepatomegaly needs to be evaluated further.  Blood sugar issues at nighttime.    6. Anxiety:  Has been  going on for a while; cancelled conference due to feeling overwhelmed with planning, finding. Multiple stressors over the past few years.  Does not understand why she is feeling anxious and overwhelmed now.  No SI/HI.  Tearful a lot.    7.  HA: persistent but much improved; s/p evaluation by Dr. Patsy Lager a few weeks ago; good experience; feels sinus related; zpack helped.  Over a year ago, had been on recurrent antibiotics due to  sinus infections. Evaluated by [facility].  Has not contacted [facility] with this issue.  Using Flonase and nasal saline.  Still has mild headache.  Less severe teeth pain.  Decided not to perform CT sinuses and head at last visit due to long wait.  75% improved.  No nasal congestion; +sinus pressure.  Thicker congestion than usual.  Less thick congestion.  One Zpack.    8. Hypercholesterolemia: due for repeat labs.    9.  Vitamin D deficiency: due for repeat labs.     Review of Systems  Constitutional: Negative for fever, chills, diaphoresis, activity change, appetite change, fatigue and unexpected weight change.  HENT: Positive for sinus pressure. Negative for ear pain, congestion, sore throat, rhinorrhea, sneezing, trouble swallowing, dental problem, voice change and postnasal drip.   Eyes: Negative for photophobia and visual disturbance.  Respiratory: Negative for shortness of breath, wheezing and stridor.   Cardiovascular: Negative for chest pain, palpitations and leg swelling.  Gastrointestinal: Negative for nausea, vomiting, abdominal pain and diarrhea.  Endocrine: Negative for cold intolerance, heat intolerance, polydipsia, polyphagia and polyuria.  Musculoskeletal: Positive for myalgias, arthralgias and gait problem. Negative for back pain and joint swelling.  Skin: Negative for rash and wound.  Neurological: Positive for numbness and headaches. Negative for dizziness, syncope, facial asymmetry, speech difficulty, weakness and light-headedness.    Psychiatric/Behavioral: Positive for dysphoric mood. Negative for suicidal ideas, sleep disturbance and self-injury. The patient is nervous/anxious.         Past Medical History  Diagnosis Date  . Wegener's granulomatosis   . GERD (gastroesophageal reflux disease)   . Hypothyroidism   . Diabetes mellitus     Type 2  . Psychic factors associated with disease     Psychogenic factors with other diseases  . Hypertension   . Nonspecific elevation of levels of transaminase or lactic acid dehydrogenase (LDH)   . Other and unspecified hyperlipidemia   . Allergic rhinitis, cause unspecified   . Unspecified vitamin D deficiency   . Obesity, unspecified   . Hypertensive retinopathy   . Venous engorgement of retina   . Chronic kidney disease   . Anemia   . Anxiety     Past Surgical History  Procedure Laterality Date  . Calcaneous bone spur surgery      x2  . Tonsillectomy    . Renal biopsy  1999  . Lung biopsy  1999  . 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 Abdominal ultrasound  05/17/2012    severe hepatomegaly at 22 cm; gallbladder normal.   [facility].  . Esophagogastroduodenoscopy  08/19/12    diffuse gastritis antrum; pathology negative for malignancy, H. Pylori; esophagus and duodenum normal.  . Ct abdomen  06/10/12    Hepatomegaly.  [facility].  Gery Pray scan  08/27/12    normal; EF 62%.  [facility].    Prior to Admission medications   Medication Sig Start Date End Date Taking? Authorizing Provider  budesonide-formoterol (SYMBICORT) 80-4.5 MCG/ACT inhaler Inhale 2 puffs into the lungs 2 (two) times daily.     Yes Historical Provider, MD  fluticasone (FLONASE) 50 MCG/ACT nasal spray Place 2 sprays into the nose daily. 09/13/12  Yes Ethelda Chick, MD  gabapentin (NEURONTIN) 100 MG capsule Take 800 mg by mouth at bedtime.    Yes Historical Provider, MD  lansoprazole (PREVACID) 30 MG capsule Take 1 capsule (30 mg total) by mouth daily. 12/30/12  Yes Morrell Riddle, PA-C   levothyroxine (SYNTHROID, LEVOTHROID) 100 MCG tablet Take 1 tablet (100 mcg total) by mouth daily. 09/13/12  Yes Ethelda Chick, MD  loratadine (CLARITIN) 10 MG tablet Take 10 mg by mouth daily.     Yes Historical Provider, MD  metFORMIN (GLUCOPHAGE) 500 MG tablet Take 1 tablet (500 mg total) by mouth every evening. With food. 09/13/12  Yes Ethelda Chick, MD  Multiple Vitamin (STRESSTABS PO) Take 1 tablet by mouth daily.   Yes Historical Provider, MD  mycophenolate (CELLCEPT) 250 MG capsule Take 250 mg by mouth at bedtime. 2 tab in the morning & 1 tab at night.   Yes Historical Provider, MD  mycophenolate (CELLCEPT) 500 MG tablet Take 500 mg by mouth every morning.     Yes Historical Provider, MD  ramipril (ALTACE) 10 MG capsule Take 1 capsule (10 mg total) by mouth daily. 09/13/12  Yes Ethelda Chick, MD  azithromycin (ZITHROMAX Z-PAK) 250 MG tablet Two tablets daily  x 1 day then one tablet daily x 4 days---please place on hold for patient. 01/11/13   Ethelda Chick, MD  DULoxetine (CYMBALTA) 60 MG capsule Take 1 capsule (60 mg total) by mouth 2 (two) times daily. 01/11/13   Ethelda Chick, MD  gabapentin (NEURONTIN) 800 MG tablet Take 1 tablet (800 mg total) by mouth 3 (three) times daily. 01/11/13   Ethelda Chick, MD  ipratropium (ATROVENT) 0.03 % nasal spray Place 2 sprays into the nose 2 (two) times daily. 01/11/13   Ethelda Chick, MD    Allergies  Allergen Reactions  . Penicillins   . Sulfonamide Derivatives     History   Social History  . Marital Status: Married    Spouse Name: N/A    Number of Children: 0  . Years of Education: college   Occupational History  . TEACHER     Full time   Social History Main Topics  . Smoking status: Never Smoker   . Smokeless tobacco: Never Used     Comment: Tobacco use-no  . Alcohol Use: No  . Drug Use: No  . Sexually Active: Yes -- Female partner(s)   Other Topics Concern  . Not on file   Social History Narrative   No regular  exercise. Always uses seat belts. Smoke alarm in the home. Caffeine use: None. Married x 22 years;happily married; no abuse.    Family History  Problem Relation Age of Onset  . Hyperlipidemia Mother     Hypercholesterolemia  . Sarcoidosis Father   . Heart disease Maternal Grandfather   . Emphysema Maternal Grandfather     Cause of death  . Diabetes Maternal Grandfather   . Heart failure Paternal Grandfather     CHF  . Heart disease Paternal Grandfather   . Asthma Brother   . Alcohol abuse Brother   . Emphysema Maternal Grandmother     Objective:   Physical Exam  Nursing note and vitals reviewed. Constitutional: She is oriented to person, place, and time. She appears well-developed and well-nourished. No distress.  HENT:  Head: Normocephalic and atraumatic.  Right Ear: External ear normal.  Left Ear: External ear normal.  Nose: Mucosal edema present. No rhinorrhea. Right sinus exhibits maxillary sinus tenderness. Right sinus exhibits no frontal sinus tenderness. Left sinus exhibits maxillary sinus tenderness. Left sinus exhibits no frontal sinus tenderness.  Mouth/Throat: Oropharynx is clear and moist.  Eyes: Conjunctivae and EOM are normal. Pupils are equal, round, and reactive to light.  Neck: Normal range of motion. Neck supple. No thyromegaly present.  Cardiovascular: Normal rate, regular rhythm, normal heart sounds and intact distal pulses.  Exam reveals no gallop and no friction rub.   No murmur heard. Pulmonary/Chest: Effort normal and breath sounds normal.  Musculoskeletal:       Right foot: She exhibits tenderness. She exhibits normal range of motion, no bony tenderness, no swelling, normal capillary refill and no deformity.       Left foot: She exhibits tenderness. She exhibits normal range of motion, no bony tenderness, no swelling, normal capillary refill and no deformity.  B FEET:  NO SWELLING; +TTP DISTAL METATARSALS; NO TTP METATARSAL SQUEEZE.  Lymphadenopathy:     She has no cervical adenopathy.  Neurological: She is alert and oriented to person, place, and time. No cranial nerve deficit. She exhibits normal muscle tone. Coordination normal.  Skin: Skin is warm and dry. No rash noted. She is not diaphoretic. No erythema.  Psychiatric: She has  a normal mood and affect. Her behavior is normal. Judgment and thought content normal.  TEARFUL.       Assessment & Plan:  Type II or unspecified type diabetes mellitus without mention of complication, not stated as uncontrolled - Plan: Comprehensive metabolic panel, Hemoglobin A1c  Pure hypercholesterolemia - Plan: CBC with Differential, Lipid panel  Unspecified hypothyroidism - Plan: TSH, T4, free  Unspecified vitamin D deficiency - Plan: Vitamin D 25 hydroxy  Hepatomegaly  Anxiety with depression  Peripheral Neuropathy  Feet Pain B    Meds ordered this encounter  Medications  . ipratropium (ATROVENT) 0.03 % nasal spray    Sig: Place 2 sprays into the nose 2 (two) times daily.    Dispense:  30 mL    Refill:  5  . azithromycin (ZITHROMAX Z-PAK) 250 MG tablet    Sig: Two tablets daily x 1 day then one tablet daily x 4 days---please place on hold for patient.    Dispense:  6 each    Refill:  0  . DULoxetine (CYMBALTA) 60 MG capsule    Sig: Take 1 capsule (60 mg total) by mouth 2 (two) times daily.    Dispense:  60 capsule    Refill:  5  . gabapentin (NEURONTIN) 800 MG tablet    Sig: Take 1 tablet (800 mg total) by mouth 3 (three) times daily.    Dispense:  90 tablet    Refill:  5

## 2013-01-11 NOTE — Patient Instructions (Addendum)
Type II or unspecified type diabetes mellitus without mention of complication, not stated as uncontrolled - Plan: Comprehensive metabolic panel, Hemoglobin A1c  Pure hypercholesterolemia - Plan: CBC with Differential, Lipid panel  Unspecified hypothyroidism - Plan: TSH, T4, free  Unspecified vitamin D deficiency - Plan: Vitamin D 25 hydroxy  Hepatomegaly

## 2013-01-17 LAB — FERRITIN: Ferritin: 5 ng/mL — ABNORMAL LOW (ref 10–291)

## 2013-01-21 ENCOUNTER — Encounter: Payer: Self-pay | Admitting: *Deleted

## 2013-01-22 ENCOUNTER — Encounter: Payer: Self-pay | Admitting: Family Medicine

## 2013-01-23 DIAGNOSIS — J0100 Acute maxillary sinusitis, unspecified: Secondary | ICD-10-CM | POA: Insufficient documentation

## 2013-01-23 DIAGNOSIS — F32A Depression, unspecified: Secondary | ICD-10-CM | POA: Insufficient documentation

## 2013-01-23 NOTE — Assessment & Plan Note (Signed)
Moderately controlled; obtain labs; non-compliance with Metformin once daily since last visit.

## 2013-01-23 NOTE — Assessment & Plan Note (Signed)
Uncontrolled; repeat labs.  Continue current supplementation.

## 2013-01-23 NOTE — Assessment & Plan Note (Signed)
Controlled; obtain labs; continue current medication. 

## 2013-01-23 NOTE — Assessment & Plan Note (Addendum)
Improved 75% with Zpack; rx for Zpack and Atrovent nasal spray provided.

## 2013-01-23 NOTE — Assessment & Plan Note (Signed)
New.  Rx for Cymbalta 60mg  daily; counseling provided.  Consider psychotherapy over the summer if warranted.

## 2013-01-23 NOTE — Assessment & Plan Note (Signed)
Moderately controlled; obtain labs.  Continue current medication.

## 2013-01-23 NOTE — Assessment & Plan Note (Signed)
Persistent; obtain labs; agreeable to referral to hepatologist at [facility].

## 2013-01-23 NOTE — Assessment & Plan Note (Signed)
Stable; to be assigned to new rheumatologist at [facility].  Counseling provided during visit regarding this transition in care.

## 2013-01-23 NOTE — Assessment & Plan Note (Signed)
Worsening; add Cymbalta 60mg  daily for peripheral neuropathy.  Pain also worsens with weightbearing which suggests additional pathology; consider podiatry consultation if no improvement with Cymbalta.  Continue Neurontin at current dose.

## 2013-01-23 NOTE — Assessment & Plan Note (Signed)
Improved; last episode 10/2012.  Diarrhea improved with decrease in Metformin dose.

## 2013-01-23 NOTE — Assessment & Plan Note (Signed)
Stable; obtain labs.

## 2013-01-23 NOTE — Assessment & Plan Note (Signed)
Worsening versus additional pathology contributing to symptomatology.  Add Cymbalta 60mg  daily for neuropathy and anxiety.  If no improvement, refer to podiatry.

## 2013-03-16 ENCOUNTER — Encounter: Payer: Self-pay | Admitting: Family Medicine

## 2013-03-16 ENCOUNTER — Other Ambulatory Visit (HOSPITAL_COMMUNITY)
Admission: RE | Admit: 2013-03-16 | Discharge: 2013-03-16 | Disposition: A | Discharge status: Home / Self Care | Payer: BC Managed Care – PPO | Source: Ambulatory Visit | Attending: Family Medicine | Admitting: Family Medicine

## 2013-03-16 ENCOUNTER — Ambulatory Visit (INDEPENDENT_AMBULATORY_CARE_PROVIDER_SITE_OTHER): Payer: BC Managed Care – PPO | Admitting: Family Medicine

## 2013-03-16 VITALS — BP 128/90 | HR 90 | Temp 98.0°F | Resp 18 | Wt 255.0 lb

## 2013-03-16 DIAGNOSIS — E119 Type 2 diabetes mellitus without complications: Secondary | ICD-10-CM

## 2013-03-16 DIAGNOSIS — M79671 Pain in right foot: Secondary | ICD-10-CM

## 2013-03-16 DIAGNOSIS — B353 Tinea pedis: Secondary | ICD-10-CM

## 2013-03-16 DIAGNOSIS — Z23 Encounter for immunization: Secondary | ICD-10-CM

## 2013-03-16 DIAGNOSIS — Z0000 Encounter for general adult medical examination without abnormal findings: Secondary | ICD-10-CM

## 2013-03-16 DIAGNOSIS — R160 Hepatomegaly, not elsewhere classified: Secondary | ICD-10-CM

## 2013-03-16 DIAGNOSIS — Z01419 Encounter for gynecological examination (general) (routine) without abnormal findings: Secondary | ICD-10-CM | POA: Insufficient documentation

## 2013-03-16 DIAGNOSIS — I10 Essential (primary) hypertension: Secondary | ICD-10-CM

## 2013-03-16 DIAGNOSIS — F419 Anxiety disorder, unspecified: Secondary | ICD-10-CM

## 2013-03-16 DIAGNOSIS — E1142 Type 2 diabetes mellitus with diabetic polyneuropathy: Secondary | ICD-10-CM

## 2013-03-16 LAB — POCT URINALYSIS DIPSTICK
Bilirubin, UA: NEGATIVE
Glucose, UA: 100
Ketones, UA: NEGATIVE
Leukocytes, UA: NEGATIVE
Nitrite, UA: NEGATIVE
pH, UA: 5.5

## 2013-03-16 LAB — COMPREHENSIVE METABOLIC PANEL
ALT: 14 U/L (ref 0–35)
AST: 14 U/L (ref 0–37)
Albumin: 4 g/dL (ref 3.5–5.2)
BUN: 17 mg/dL (ref 6–23)
CO2: 29 mEq/L (ref 19–32)
Calcium: 9.2 mg/dL (ref 8.4–10.5)
Chloride: 101 mEq/L (ref 96–112)
Creat: 0.92 mg/dL (ref 0.50–1.10)
Potassium: 4.4 mEq/L (ref 3.5–5.3)

## 2013-03-16 LAB — CBC
HCT: 37.9 % (ref 36.0–46.0)
MCV: 77 fL — ABNORMAL LOW (ref 78.0–100.0)
Platelets: 360 10*3/uL (ref 150–400)
RBC: 4.92 MIL/uL (ref 3.87–5.11)
WBC: 10.5 10*3/uL (ref 4.0–10.5)

## 2013-03-16 LAB — LIPID PANEL
Cholesterol: 184 mg/dL (ref 0–200)
Total CHOL/HDL Ratio: 3.9 Ratio

## 2013-03-16 LAB — FOLATE: Folate: >20 ng/mL

## 2013-03-16 LAB — TSH: TSH: 1.79 u[IU]/mL (ref 0.350–4.500)

## 2013-03-16 LAB — VITAMIN B12: Vitamin B-12: 451 pg/mL (ref 211–911)

## 2013-03-16 LAB — HEMOGLOBIN A1C: Mean Plasma Glucose: 163 mg/dL — ABNORMAL HIGH (ref <117)

## 2013-03-16 MED ORDER — RAMIPRIL 5 MG PO CAPS: 5.0000 mg | ORAL_CAPSULE | Freq: Every day | ORAL | Status: DC

## 2013-03-16 MED ORDER — LANSOPRAZOLE 30 MG PO CPDR: 30.0000 mg | DELAYED_RELEASE_CAPSULE | Freq: Every day | ORAL | Status: DC

## 2013-03-16 MED ORDER — KETOCONAZOLE 2 % EX CREA: TOPICAL_CREAM | Freq: Two times a day (BID) | CUTANEOUS | Status: DC

## 2013-03-16 MED ORDER — HEPATITIS B IMMUNE GLOBULIN IM SOLN
0.5000 mL | Freq: Once | INTRAMUSCULAR | Status: AC
  Administered 2013-03-16: 0.5 mL via INTRAMUSCULAR

## 2013-03-16 NOTE — Progress Notes (Signed)
[address]   [place], [place]  [address]   [phone]  Subjective:    Patient ID: Rachel Walls, female    DOB: February 22, 1966, 47 y.o.   MRN: [id number]  HPI This 47 y.o. female presents for CPE.    Last physical 02/2012. Pap smear 06/13/12. Mammogram 06/11/12.  [facility]. Colonoscopy 2012.  Repeat in ten years.  Internal hemorhroids. TDAP 01/18/11. 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 Hepatitis B series never.   Flu vaccine 09/24/2012. Eye exam 04/2012; [facility]; +glasses; no glaucoma or cataracts. Dental exam every six months.   HTN:  Blood pressure running 156/99, 157/100, 168/103. Concerned about readings.  Peripheral Neuropathy:  Improved with addition of Cymbalta; feet pain much improved with weight bearing also.  Pleased with results.  Anxiety: much improved with addition of Cymbalta.    Hepatomegaly: has still never received referral/consultation from Liver Clinic at [facility].  Review of Systems  Constitutional: Negative for fever, chills, diaphoresis, activity change, appetite change, fatigue and unexpected weight change.  HENT: Positive for congestion, rhinorrhea, sneezing, postnasal drip and sinus pressure. Negative for hearing loss, ear pain, sore throat, facial swelling, drooling, mouth sores, trouble swallowing, neck pain, neck stiffness, dental problem, voice change, tinnitus and ear discharge.   Eyes: Negative for photophobia, pain, discharge, redness, itching and visual disturbance.  Respiratory: Negative for apnea, cough, choking, chest tightness, shortness of breath, wheezing and stridor.   Cardiovascular: Negative for chest pain, palpitations and leg swelling.  Gastrointestinal: Negative for nausea, vomiting, abdominal pain, diarrhea, constipation, blood in stool, abdominal distention, anal bleeding and rectal pain.  Endocrine: Negative for cold intolerance, heat intolerance, polydipsia, polyphagia and polyuria.  Genitourinary: Negative for dysuria,  urgency, frequency, hematuria, flank pain, decreased urine volume, vaginal bleeding, vaginal discharge, enuresis, difficulty urinating, genital sores, vaginal pain, menstrual problem, pelvic pain and dyspareunia.  Musculoskeletal: Positive for arthralgias. Negative for myalgias, back pain, joint swelling and gait problem.  Skin: Negative for color change, pallor, rash and wound.  Neurological: Positive for numbness. Negative for dizziness, tremors, seizures, syncope, facial asymmetry, speech difficulty, weakness, light-headedness and headaches.  Hematological: Negative for adenopathy. Does not bruise/bleed easily.  Psychiatric/Behavioral: Negative for suicidal ideas, hallucinations, behavioral problems, confusion, sleep disturbance, self-injury, dysphoric mood, decreased concentration and agitation. The patient is nervous/anxious. The patient is not hyperactive.        Objective:   Physical Exam  Nursing note and vitals reviewed. Constitutional: She is oriented to person, place, and time. She appears well-developed and well-nourished. No distress.  HENT:  Head: Normocephalic and atraumatic.  Right Ear: External ear normal.  Left Ear: External ear normal.  Nose: Nose normal.  Mouth/Throat: Oropharynx is clear and moist.  Eyes: Conjunctivae and EOM are normal. Pupils are equal, round, and reactive to light.  Neck: Normal range of motion. Neck supple. No JVD present. No tracheal deviation present. No thyromegaly present.  Cardiovascular: Normal rate, regular rhythm, normal heart sounds and intact distal pulses.  Exam reveals no gallop and no friction rub.   No murmur heard. Pulmonary/Chest: Effort normal and breath sounds normal. No respiratory distress. She has no wheezes. She has no rales.  Abdominal: Soft. Bowel sounds are normal. She exhibits no distension and no mass. There is no tenderness. There is no rebound and no guarding.  Genitourinary: Vagina normal and uterus normal. No breast  swelling, tenderness, discharge or bleeding. There is no rash, tenderness or lesion on the right labia. There is no rash, tenderness or lesion on  the left labia. Right adnexum displays no mass, no tenderness and no fullness. Left adnexum displays no mass, no tenderness and no fullness.  Cervix very difficult to visualize fully.  Musculoskeletal: Normal range of motion.  Lymphadenopathy:    She has no cervical adenopathy.  Neurological: She is alert and oriented to person, place, and time. She has normal reflexes. No cranial nerve deficit. She exhibits normal muscle tone. Coordination normal.  Skin: Skin is warm and dry. Rash noted. She is not diaphoretic. There is erythema. No pallor.  +scaling with mild erythema interdigits B feet.  Psychiatric: She has a normal mood and affect. Her behavior is normal. Judgment and thought content normal.   EKG: NSR  Hepatitis B#1 administered in office.    Assessment & Plan:  Routine general medical examination at a health care facility - Plan: Comprehensive metabolic panel, Hemoglobin A1c, EKG 12-Lead, hepatitis B immune globulin injection 0.5 mL  Type II or unspecified type diabetes mellitus without mention of complication, not stated as uncontrolled - Plan: CBC, Lipid panel, Microalbumin, urine, POCT urinalysis dipstick, TSH, T4, Free, Vitamin B12, Folate, sitaGLIPtin (JANUVIA) 50 MG tablet  Routine gynecological examination - Plan: Pap IG (Image Guided)  Hepatomegaly - Plan: Ambulatory referral to Gastroenterology  Essential hypertension, benign - Plan: ramipril (ALTACE) 5 MG capsule  Tinea pedis - Plan: ketoconazole (NIZORAL) 2 % cream   Meds ordered this encounter  Medications  . DISCONTD: ramipril (ALTACE) 10 MG capsule    Sig: Take 2.5 mg by mouth daily.  . ramipril (ALTACE) 5 MG capsule    Sig: Take 1 capsule (5 mg total) by mouth daily.    Dispense:  90 capsule    Refill:  3  . lansoprazole (PREVACID) 30 MG capsule    Sig: Take 1  capsule (30 mg total) by mouth daily.    Dispense:  90 capsule    Refill:  3    Order Specific Question:  Supervising Provider    Answer:  DOOLITTLE, ROBERT P [[id number]]  . ketoconazole (NIZORAL) 2 % cream    Sig: Apply topically 2 (two) times daily.    Dispense:  15 g    Refill:  0  . hepatitis B immune globulin injection 0.5 mL    Sig:   . sitaGLIPtin (JANUVIA) 50 MG tablet    Sig: Take 1 tablet (50 mg total) by mouth daily. Take with food    Dispense:  30 tablet    Refill:  5

## 2013-03-17 ENCOUNTER — Telehealth: Payer: Self-pay | Admitting: Radiology

## 2013-03-17 LAB — MICROALBUMIN, URINE: Microalb, Ur: 12.3 mg/dL — ABNORMAL HIGH (ref 0.00–1.89)

## 2013-03-17 NOTE — Telephone Encounter (Signed)
Spoke to Lab cytology at [facility], specimen is there, it needs to be at [facility] advised she will take specimen to correct place.

## 2013-03-18 ENCOUNTER — Encounter: Payer: Self-pay | Admitting: Family Medicine

## 2013-03-18 MED ORDER — SITAGLIPTIN PHOSPHATE 50 MG PO TABS: 50.0000 mg | ORAL_TABLET | Freq: Every day | ORAL | Status: DC

## 2013-03-25 ENCOUNTER — Encounter: Payer: Self-pay | Admitting: Family Medicine

## 2013-04-13 DIAGNOSIS — Z01419 Encounter for gynecological examination (general) (routine) without abnormal findings: Secondary | ICD-10-CM | POA: Insufficient documentation

## 2013-04-13 DIAGNOSIS — Z0000 Encounter for general adult medical examination without abnormal findings: Secondary | ICD-10-CM | POA: Insufficient documentation

## 2013-04-13 DIAGNOSIS — B353 Tinea pedis: Secondary | ICD-10-CM | POA: Insufficient documentation

## 2013-04-13 DIAGNOSIS — Z23 Encounter for immunization: Secondary | ICD-10-CM | POA: Insufficient documentation

## 2013-04-13 NOTE — Assessment & Plan Note (Signed)
Improved with addition of Cymbalta.  No changes to therapy at this time.

## 2013-04-13 NOTE — Assessment & Plan Note (Signed)
Refer again to [facility].  Patient never notified regarding initial consult three months ago.

## 2013-04-13 NOTE — Assessment & Plan Note (Signed)
Completed; pap smear obtained; mammogram UTD in 05/2012.

## 2013-04-13 NOTE — Assessment & Plan Note (Signed)
Anticipatory guidance --- weight loss, exercise.  Pap smear obtained; mammogram UTD.  Colonoscopy UTD.  S/p Hepatitis B#1 in office; RTC three months for Hepatitis B#2.  Obtain labs.

## 2013-04-13 NOTE — Assessment & Plan Note (Signed)
Improved with addition of Cymbalta; no changes to management at this time.

## 2013-04-13 NOTE — Assessment & Plan Note (Addendum)
Moderately controlled; obtain labs.  Add Januvia if HgbA1c still > 7.0.

## 2013-04-13 NOTE — Assessment & Plan Note (Signed)
Worsening; will increase Ramipril to 5mg  daily.

## 2013-04-13 NOTE — Assessment & Plan Note (Signed)
Improved with addition of Cymbalta in 11/2012; thus, likely due to Peripheral Neuropathy.

## 2013-04-13 NOTE — Assessment & Plan Note (Signed)
New.  Rx for Ketoconazole cream provided to use bid for two weeks.

## 2013-06-03 NOTE — Progress Notes (Signed)
PA approved for lansoprazole  Through 06/02/14. Notified pharmacy.

## 2013-06-17 ENCOUNTER — Telehealth: Payer: Self-pay

## 2013-06-17 NOTE — Telephone Encounter (Signed)
Pharm requests RF of mycophenolate 250 mg cap, 2 tab Qam and 1 tab Qhs. Dr Katrinka Blazing, I wasn't sure if you manage this med for pt. Do you want to RF?

## 2013-06-17 NOTE — Telephone Encounter (Signed)
I usually do not manage this medication for patient; she will need to call rheumatology office for refills.  I know that her previous rheumatologist has left [facility]; however, the clinic should be willing to refill her medications if she has an upcoming appointment with new rheumatologist. Please call patient to clarify.

## 2013-06-18 NOTE — Telephone Encounter (Signed)
LM on cell to [facility] number is out of service

## 2013-06-20 ENCOUNTER — Ambulatory Visit: Payer: BC Managed Care – PPO | Admitting: Family Medicine

## 2013-06-21 ENCOUNTER — Telehealth: Payer: Self-pay | Admitting: *Deleted

## 2013-06-21 NOTE — Telephone Encounter (Signed)
Prior Berkley Harvey was approved on July 11th and it is good until 06/02/2014. The case id number is [id number]. If we would like to start a review next year we can start 60 days prior the day it expires with the case id [id number]

## 2013-06-22 ENCOUNTER — Telehealth: Payer: Self-pay

## 2013-06-22 NOTE — Telephone Encounter (Signed)
Her med was approved. Called her to advise. (prevacid)

## 2013-06-22 NOTE — Telephone Encounter (Signed)
PT STATES SOMEONE HAD CALLED REGARDING HER PRESCRIPTION. PLEASE CALL [phone]

## 2013-06-22 NOTE — Telephone Encounter (Signed)
Patient advised.

## 2013-06-22 NOTE — Telephone Encounter (Signed)
Left message with gentleman for pt to call back.

## 2013-06-27 ENCOUNTER — Encounter: Payer: Self-pay | Admitting: Family Medicine

## 2013-06-27 ENCOUNTER — Ambulatory Visit (INDEPENDENT_AMBULATORY_CARE_PROVIDER_SITE_OTHER): Payer: BC Managed Care – PPO | Admitting: Family Medicine

## 2013-06-27 VITALS — BP 146/100 | HR 83 | Temp 98.8°F | Resp 16 | Ht 64.5 in | Wt 267.0 lb

## 2013-06-27 DIAGNOSIS — E782 Mixed hyperlipidemia: Secondary | ICD-10-CM

## 2013-06-27 DIAGNOSIS — Z23 Encounter for immunization: Secondary | ICD-10-CM

## 2013-06-27 DIAGNOSIS — E119 Type 2 diabetes mellitus without complications: Secondary | ICD-10-CM

## 2013-06-27 DIAGNOSIS — H60542 Acute eczematoid otitis externa, left ear: Secondary | ICD-10-CM

## 2013-06-27 DIAGNOSIS — I83893 Varicose veins of bilateral lower extremities with other complications: Secondary | ICD-10-CM

## 2013-06-27 DIAGNOSIS — I10 Essential (primary) hypertension: Secondary | ICD-10-CM

## 2013-06-27 DIAGNOSIS — R12 Heartburn: Secondary | ICD-10-CM

## 2013-06-27 DIAGNOSIS — R160 Hepatomegaly, not elsewhere classified: Secondary | ICD-10-CM

## 2013-06-27 LAB — LIPID PANEL
Cholesterol: 190 mg/dL (ref 0–200)
Triglycerides: 317 mg/dL — ABNORMAL HIGH (ref <150)
VLDL: 63 mg/dL — ABNORMAL HIGH (ref 0–40)

## 2013-06-27 LAB — COMPREHENSIVE METABOLIC PANEL
AST: 13 U/L (ref 0–37)
Alkaline Phosphatase: 76 U/L (ref 39–117)
BUN: 13 mg/dL (ref 6–23)
Calcium: 9.5 mg/dL (ref 8.4–10.5)
Chloride: 102 mEq/L (ref 96–112)
Creat: 0.75 mg/dL (ref 0.50–1.10)
Glucose, Bld: 241 mg/dL — ABNORMAL HIGH (ref 70–99)

## 2013-06-27 MED ORDER — MELOXICAM 15 MG PO TABS: 15.0000 mg | ORAL_TABLET | Freq: Every day | ORAL | Status: DC

## 2013-06-27 MED ORDER — SITAGLIPTIN PHOSPHATE 50 MG PO TABS: 50.0000 mg | ORAL_TABLET | Freq: Every day | ORAL | Status: DC

## 2013-06-27 NOTE — Patient Instructions (Addendum)
1.  APPLY HYDROCORTISONE CREAM 1% TO EAR TWICE DAILY FOR TWO WEEKS AND THEN ONCE DAILY FOR TWO WEEKS AND THEN AS NEEDED. 2.  YOU WILL NEED YOUR THIRD HEPATITIS B SHOT AT NEXT VISIT.

## 2013-06-27 NOTE — Progress Notes (Signed)
[address]   [place], [place]  [address]   [phone]  Subjective:    Patient ID: Rachel Walls, female    DOB: Dec 21, 1965, 47 y.o.   MRN: [id number]  HPI This 47 y.o. female presents for three month follow-up:  1.  HTN: increased Ramipril to 5mg  daily at last visit.  Not checking BP today.  Reports good tolerance to medication, good compliance with medication; good symptom control.  2.  DMII: added Januvia 50mg  daily at last visit.  S/p Hepatitis B#1 at last visit; due for #2. Did not start Januvia.  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  Started exercising this summer.    3.  Hyperlipidemia: triglycerides of 305 at last visit.  Eating low fat cheeses; exercising now; 3 days per week.  Ate this morning.    4. Tinea pedis: rx for Ketonazole provided.  Foot rash now improved.  5.  Hepatomegaly: referred to [facility] hepatology clinic again at last visit.  Has not heard from clinic.    6.  Wrists, hands, knees, ankles, toes pain:  Sore and ache; some swelling.  Feels tight.  R leg bigger than L leg.  Two posterior bulges along veins that are enlarging.  Onset one week ago.  No change in activity level.  Has been preparing for school; has been painting classroom; has been cutting out things for the beginning of school.  Hands and feet feel tight.  Some swelling.  7.  L ear itching: chronic itch.  Has used Qtip to scratch; thinks dry; skin dry everywhere.    8.  Mammogram:  Scheduled for this week.    9. Wegener's: follow-up with rheumatology in 07/2013.  Review of Systems  Constitutional: Negative for fever, chills, diaphoresis and fatigue.  HENT: Negative for ear pain and ear discharge.   Respiratory: Negative for cough, shortness of breath, wheezing and stridor.   Cardiovascular: Negative for chest pain, palpitations and leg swelling.  Gastrointestinal: Negative for nausea, vomiting, abdominal pain and diarrhea.  Endocrine: Negative for cold intolerance, heat intolerance, polydipsia,  polyphagia and polyuria.  Musculoskeletal: Positive for joint swelling and arthralgias. Negative for myalgias and back pain.  Skin: Negative for color change, pallor, rash and wound.  Neurological: Negative for dizziness, tremors, seizures, syncope, facial asymmetry, speech difficulty, weakness, light-headedness, numbness and headaches.   Past Medical History  Diagnosis Date  . Wegener's granulomatosis   . GERD (gastroesophageal reflux disease)   . Hypothyroidism   . Diabetes mellitus     Type 2  . Psychic factors associated with disease     Psychogenic factors with other diseases  . Hypertension   . Nonspecific elevation of levels of transaminase or lactic acid dehydrogenase (LDH)   . Other and unspecified hyperlipidemia   . Allergic rhinitis, cause unspecified   . Unspecified vitamin D deficiency   . Obesity, unspecified   . Hypertensive retinopathy   . Venous engorgement of retina   . Chronic kidney disease   . Anemia   . Anxiety    Current Outpatient Prescriptions on File Prior to Visit  Medication Sig Dispense Refill  . DULoxetine (CYMBALTA) 60 MG capsule Take 1 capsule (60 mg total) by mouth 2 (two) times daily.  60 capsule  5  . fluticasone (FLONASE) 50 MCG/ACT nasal spray Place 2 sprays into the nose daily.  16 g  11  . gabapentin (NEURONTIN) 100 MG capsule Take 800 mg by mouth at bedtime.       Marland Kitchen ipratropium (ATROVENT)  0.03 % nasal spray Place 2 sprays into the nose 2 (two) times daily.  30 mL  5  . ketoconazole (NIZORAL) 2 % cream Apply topically 2 (two) times daily.  15 g  0  . lansoprazole (PREVACID) 30 MG capsule Take 1 capsule (30 mg total) by mouth daily.  90 capsule  3  . levothyroxine (SYNTHROID, LEVOTHROID) 100 MCG tablet Take 1 tablet (100 mcg total) by mouth daily.  30 tablet  11  . loratadine (CLARITIN) 10 MG tablet Take 10 mg by mouth daily.        . metFORMIN (GLUCOPHAGE) 500 MG tablet Take 1 tablet (500 mg total) by mouth every evening. With food.  60  tablet  5  . mycophenolate (CELLCEPT) 250 MG capsule Take 250 mg by mouth at bedtime. 2 tab in the morning & 1 tab at night.      . mycophenolate (CELLCEPT) 500 MG tablet Take 500 mg by mouth every morning.        . ramipril (ALTACE) 5 MG capsule Take 1 capsule (5 mg total) by mouth daily.  90 capsule  3  . Multiple Vitamin (STRESSTABS PO) Take 1 tablet by mouth daily.       No current facility-administered medications on file prior to visit.       Objective:   Physical Exam  Nursing note and vitals reviewed. Constitutional: She is oriented to person, place, and time. She appears well-developed and well-nourished. No distress.  HENT:  Head: Normocephalic and atraumatic.  Right Ear: External ear normal.  Left Ear: External ear normal.  Mouth/Throat: Oropharynx is clear and moist.  Eyes: Conjunctivae are normal. Pupils are equal, round, and reactive to light.  Neck: Normal range of motion. Neck supple. No thyromegaly present.  Cardiovascular: Normal rate, regular rhythm and normal heart sounds.   R posterior calf with varicosity without erythema, tenderness.  Hommen's negative.   R calf 44 cm and L calf 43 cm.  Pulmonary/Chest: Effort normal and breath sounds normal. She has no wheezes. She has no rales.  Abdominal: Soft. Bowel sounds are normal. There is no tenderness. There is no rebound and no guarding.  Musculoskeletal:       Right shoulder: She exhibits pain. She exhibits normal range of motion, no tenderness, no bony tenderness, no swelling, no spasm, normal pulse and normal strength.       Right wrist: Normal.       Left wrist: Normal.       Right hand: Normal.       Left hand: Normal.       Right foot: Normal.       Left foot: Normal.  Lymphadenopathy:    She has no cervical adenopathy.  Neurological: She is alert and oriented to person, place, and time. She has normal reflexes.  Skin: Skin is warm and dry. No rash noted. She is not diaphoretic.  Psychiatric: She has a  normal mood and affect. Her behavior is normal. Judgment and thought content normal.      Assessment & Plan:  Diabetes - Plan: Hemoglobin A1c  Essential hypertension, benign - Plan: CBC with Differential, Comprehensive metabolic panel  Mixed hyperlipidemia - Plan: Lipid panel  Need for hepatitis B vaccination - Plan: Hepatitis B vaccine adult IM  Type II or unspecified type diabetes mellitus without mention of complication, not stated as uncontrolled - Plan: sitaGLIPtin (JANUVIA) 50 MG tablet  Varicose veins of lower extremities with other complications - Plan: Ambulatory referral to  Vascular Surgery  Hepatomegaly - Plan: Ambulatory referral to Gastroenterology, US Abdomen Complete  1. DMII: uncontrolled; rx for Januvia provided again.  Obtain labs.  S/p Hepatitis B#2. 2.  HTN: uncontrolled; agreeable to repeat BP at next visit; has gained twelve pounds over the summer; if still elevated at next visit, will increase Ramipril dose. 3.  Hyperlipidemia:  Worsening; repeat today.   4.  Hepatomegaly:  Persistent; repeat abdominal u/s and refer for third time to hepatology clinic at [facility]. 5.  Varicose veins with RLE swelling:  Worsening; refer to vascular clinic in [facility] for doppler and evaluation.  Will benefit from compression stockings while at work. 6.  External ear canal R dermatitis:  New. Recommend OTC hydrocortisone 1% to ear bid.  7.  S/p Hepatitis B#2; RTC three months for Hepatitis B#3. 8.  Arthralgias:  New. With change in activity level in past few weeks. Rx for Meloxicam provided.  To discuss with rheumatology next month if persistent.   Meds ordered this encounter  Medications  . sitaGLIPtin (JANUVIA) 50 MG tablet    Sig: Take 1 tablet (50 mg total) by mouth daily. Take with food    Dispense:  30 tablet    Refill:  5  . meloxicam (MOBIC) 15 MG tablet    Sig: Take 1 tablet (15 mg total) by mouth daily.    Dispense:  30 tablet    Refill:  0

## 2013-06-28 LAB — CBC WITH DIFFERENTIAL/PLATELET
Basophils Absolute: 0.1 10*3/uL (ref 0.0–0.1)
Basophils Relative: 1 % (ref 0–1)
Eosinophils Relative: 4 % (ref 0–5)
HCT: 37.3 % (ref 36.0–46.0)
MCHC: 32.4 g/dL (ref 30.0–36.0)
MCV: 78.5 fL (ref 78.0–100.0)
Monocytes Absolute: 0.8 10*3/uL (ref 0.1–1.0)
RDW: 14.7 % (ref 11.5–15.5)

## 2013-06-28 LAB — HEMOGLOBIN A1C: Mean Plasma Glucose: 189 mg/dL — ABNORMAL HIGH (ref <117)

## 2013-06-30 ENCOUNTER — Ambulatory Visit: Payer: Self-pay | Admitting: Family Medicine

## 2013-07-05 ENCOUNTER — Encounter: Payer: Self-pay | Admitting: Family Medicine

## 2013-07-06 ENCOUNTER — Ambulatory Visit
Admission: RE | Admit: 2013-07-06 | Discharge: 2013-07-06 | Disposition: A | Discharge status: Home / Self Care | Payer: BC Managed Care – PPO | Source: Ambulatory Visit | Attending: Family Medicine | Admitting: Family Medicine

## 2013-07-06 DIAGNOSIS — R160 Hepatomegaly, not elsewhere classified: Secondary | ICD-10-CM

## 2013-07-11 ENCOUNTER — Other Ambulatory Visit: Payer: Self-pay | Admitting: Family Medicine

## 2013-07-11 ENCOUNTER — Encounter: Payer: Self-pay | Admitting: Family Medicine

## 2013-07-11 DIAGNOSIS — M3130 Wegener's granulomatosis without renal involvement: Secondary | ICD-10-CM

## 2013-07-11 DIAGNOSIS — K760 Fatty (change of) liver, not elsewhere classified: Secondary | ICD-10-CM

## 2013-07-11 DIAGNOSIS — R160 Hepatomegaly, not elsewhere classified: Secondary | ICD-10-CM

## 2013-08-15 IMAGING — NM NUCLEAR MEDICINE GASTRIC EMPTYING STUDY
1 series · 10 of 10 positions shown · non-contrast
Comparison: none

REASON FOR EXAM: Nausea vomiting Upper Abd pain Diabetes
COMMENTS:

[Series 1000: gatric statics (results) · 3.90mm/px · 5 acquisitions, 10 frames shown]
[im 1/5]
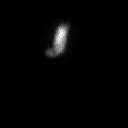
[im 1/5]
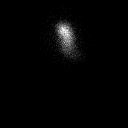
[im 2/5]
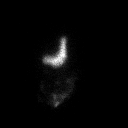
[im 2/5]
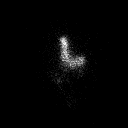
[im 3/5]
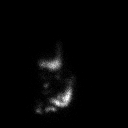
[im 3/5]
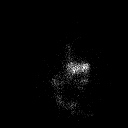
[im 4/5]
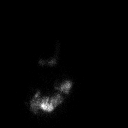
[im 4/5]
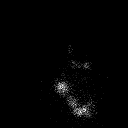
[im 5/5]
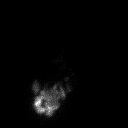
[im 5/5]
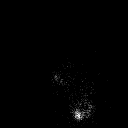

[10 of 10 positions shown; findings below may reference images not displayed]

PROCEDURE:     NM  - NM GASTRIC EMPTYING STUDY  - September 22, 2012  [DATE]

RESULT:     Nuclear medicine gastric emptying evaluation dated 09/22/2012.

Procedure: Radiopharmaceutical colon 2.0 to millicuries of technetium 99 M
labeled sulfur colloid.

Four scramble eggs labeled with sulfur colloid inserted with 2 slices of
toast Neubauer and 4 ounces of water.

Standard images of the abdomen were obtained in their and a standard
obliquities. Patient's gastric emptying was calculated.
FINDINGS: The patient's gastric emptying is 99%. There is no evidence of
appreciable gastroesophageal reflux.
IMPRESSION: Unremarkable nuclear medicine gastric emptying evaluation.

## 2013-08-22 ENCOUNTER — Encounter: Payer: Self-pay | Admitting: Family Medicine

## 2013-09-16 ENCOUNTER — Other Ambulatory Visit: Payer: Self-pay

## 2013-09-16 DIAGNOSIS — E039 Hypothyroidism, unspecified: Secondary | ICD-10-CM

## 2013-09-16 MED ORDER — LEVOTHYROXINE SODIUM 100 MCG PO TABS: 100.0000 ug | ORAL_TABLET | Freq: Every day | ORAL | Status: DC

## 2013-09-28 ENCOUNTER — Encounter: Payer: Self-pay | Admitting: Family Medicine

## 2013-09-28 ENCOUNTER — Ambulatory Visit (INDEPENDENT_AMBULATORY_CARE_PROVIDER_SITE_OTHER): Payer: BC Managed Care – PPO | Admitting: Family Medicine

## 2013-09-28 VITALS — BP 118/86 | HR 93 | Temp 98.2°F | Resp 16 | Ht 64.5 in | Wt 266.8 lb

## 2013-09-28 DIAGNOSIS — E119 Type 2 diabetes mellitus without complications: Secondary | ICD-10-CM

## 2013-09-28 DIAGNOSIS — F341 Dysthymic disorder: Secondary | ICD-10-CM

## 2013-09-28 DIAGNOSIS — Z23 Encounter for immunization: Secondary | ICD-10-CM

## 2013-09-28 DIAGNOSIS — I10 Essential (primary) hypertension: Secondary | ICD-10-CM

## 2013-09-28 DIAGNOSIS — J0100 Acute maxillary sinusitis, unspecified: Secondary | ICD-10-CM

## 2013-09-28 DIAGNOSIS — M3130 Wegener's granulomatosis without renal involvement: Secondary | ICD-10-CM

## 2013-09-28 DIAGNOSIS — R160 Hepatomegaly, not elsewhere classified: Secondary | ICD-10-CM

## 2013-09-28 DIAGNOSIS — F329 Major depressive disorder, single episode, unspecified: Secondary | ICD-10-CM

## 2013-09-28 LAB — COMPREHENSIVE METABOLIC PANEL
ALT: 17 U/L (ref 0–35)
AST: 15 U/L (ref 0–37)
Albumin: 3.8 g/dL (ref 3.5–5.2)
Alkaline Phosphatase: 69 U/L (ref 39–117)
BUN: 15 mg/dL (ref 6–23)
Glucose, Bld: 180 mg/dL — ABNORMAL HIGH (ref 70–99)
Sodium: 138 mEq/L (ref 135–145)
Total Bilirubin: 0.3 mg/dL (ref 0.3–1.2)
Total Protein: 6.9 g/dL (ref 6.0–8.3)

## 2013-09-28 LAB — CBC WITH DIFFERENTIAL/PLATELET
Basophils Absolute: 0 10*3/uL (ref 0.0–0.1)
Basophils Relative: 0 % (ref 0–1)
Lymphocytes Relative: 24 % (ref 12–46)
MCHC: 32.9 g/dL (ref 30.0–36.0)
Neutro Abs: 5.9 10*3/uL (ref 1.7–7.7)
Neutrophils Relative %: 62 % (ref 43–77)
RDW: 13.9 % (ref 11.5–15.5)
WBC: 9.5 10*3/uL (ref 4.0–10.5)

## 2013-09-28 MED ORDER — SITAGLIPTIN PHOSPHATE 100 MG PO TABS: 100.0000 mg | ORAL_TABLET | Freq: Every day | ORAL | Status: DC

## 2013-09-28 NOTE — Progress Notes (Signed)
[address]   [place], [place]  [address]   [phone]  Subjective:    Patient ID: Rachel Walls, female    DOB: 04-Jul-1966, 47 y.o.   MRN: [id number]  HPI This 47 y.o. female presents for three month follow-up:  1.  Hepatomegaly:  S/p consultation with liver specialist at [facility].  +fatty liver but not diseased.  If liver became diseased, pt received impression that a liver transplant would not be an option due to Wegener's.  Recommended aggressive control of diabetes.  Recommended Hepatitis A series.  No follow-up unless LFTs are abnormal.       2.  DMII: at last visit, added Januvia 50mg  daily.  Sugars continue to remain elevated.  Intolerant to wheat.   Does not rye.  B:  Oatmeal walnuts, milk.  Snack: none  Lunch:  Yogurt, almonds, crackers, water.   Going to [employer] take off pounds sensibly.  Snack: walnuts.  Supper:  [place].  Hamburger, popcorn. Minimal veges; will eat carrots.  Salads.  Metformin 500mg  one daily.  3.  Hepatitis B series: due for Hepatitis B#3.    4. HTN:  Not checking.  Patient reports good compliance with medication, good tolerance to medication, and good symptom control.    5.  Varicose veins:  Referred to vascular surgery; rx for compression stockings x 3 months; wearing compression stockings every other day on average. Compression stockings are helping; varicosities less prominent.   6. Wegener's disease:  Stable.  7.  Sinusitis: on 09/03/13 on Saturday; prescribed Minocycline.  Still having sinus congestion.  Hoarseness present.  +PND.  8. Fatigued: not a morning person; getting up at 5:00am to exercise is not happening.  After school, exhausted.  No snoring.  No sleep study.    Review of Systems  Constitutional: Positive for fatigue. Negative for fever, chills and diaphoresis.  HENT: Positive for congestion, postnasal drip and rhinorrhea. Negative for sore throat.   Respiratory: Negative for shortness of breath, wheezing and stridor.   Cardiovascular:  Negative for chest pain, palpitations and leg swelling.  Endocrine: Negative for cold intolerance, heat intolerance, polydipsia, polyphagia and polyuria.  Neurological: Negative for dizziness, tremors, seizures, syncope, facial asymmetry, speech difficulty, weakness, light-headedness, numbness and headaches.   Past Medical History  Diagnosis Date  . Wegener's granulomatosis   . GERD (gastroesophageal reflux disease)   . Hypothyroidism   . Diabetes mellitus     Type 2  . Psychic factors associated with disease     Psychogenic factors with other diseases  . Hypertension   . Nonspecific elevation of levels of transaminase or lactic acid dehydrogenase (LDH)   . Other and unspecified hyperlipidemia   . Allergic rhinitis, cause unspecified   . Unspecified vitamin D deficiency   . Obesity, unspecified   . Hypertensive retinopathy   . Venous engorgement of retina   . Chronic kidney disease   . Anemia   . Anxiety    Allergies  Allergen Reactions  . Penicillins Hives  . Sulfonamide Derivatives     Reaction: arthralgias.   Current Outpatient Prescriptions on File Prior to Visit  Medication Sig Dispense Refill  . fluticasone (FLONASE) 50 MCG/ACT nasal spray Place 2 sprays into the nose daily.  16 g  11  . ipratropium (ATROVENT) 0.03 % nasal spray Place 2 sprays into the nose 2 (two) times daily.  30 mL  5  . loratadine (CLARITIN) 10 MG tablet Take 10 mg by mouth daily as needed.       Marland Kitchen  metFORMIN (GLUCOPHAGE) 500 MG tablet Take 1 tablet (500 mg total) by mouth every evening. With food.  60 tablet  5  . mycophenolate (CELLCEPT) 250 MG capsule Take 250 mg by mouth at bedtime. 2 tab in the morning & 1 tab at night.      . ramipril (ALTACE) 5 MG capsule Take 1 capsule (5 mg total) by mouth daily.  90 capsule  3  . mycophenolate (CELLCEPT) 500 MG tablet Take 500 mg by mouth every morning.         No current facility-administered medications on file prior to visit.   History   Social  History  . Marital Status: Married    Spouse Name: N/A    Number of Children: 0  . Years of Education: college   Occupational History  . TEACHER     Full time   Social History Main Topics  . Smoking status: Never Smoker   . Smokeless tobacco: Never Used     Comment: Tobacco use-no  . Alcohol Use: No  . Drug Use: No  . Sexual Activity: Yes    Partners: Male   Other Topics Concern  . Not on file   Social History Narrative   No regular exercise.       Always uses seat belts.       Smoke alarm in the home.       Caffeine use: None.    Married x 23 years;happily married; no abuse.      Children: none      Lives: with husband, 2 cats, 1 dogs.      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      Tobacco: never      Alcohol: never      Drugs: none             Objective:   Physical Exam  Constitutional: She is oriented to person, place, and time. She appears well-developed and well-nourished. No distress.  obese  HENT:  Head: Normocephalic and atraumatic.  Right Ear: External ear normal.  Left Ear: External ear normal.  Nose: Nose normal.  Mouth/Throat: Oropharynx is clear and moist.  Eyes: Conjunctivae and EOM are normal. Pupils are equal, round, and reactive to light.  Neck: Normal range of motion. Neck supple. Carotid bruit is not present. No thyromegaly present.  Cardiovascular: Normal rate, regular rhythm, normal heart sounds and intact distal pulses.  Exam reveals no gallop and no friction rub.   No murmur heard. Pulmonary/Chest: Effort normal and breath sounds normal. She has no wheezes. She has no rales.  Abdominal: Soft. Bowel sounds are normal. She exhibits no distension and no mass. There is no tenderness. There is no rebound and no guarding.  Lymphadenopathy:    She has no cervical adenopathy.  Neurological: She is alert and oriented to person, place, and time. No cranial nerve deficit.  Skin: Skin is warm and  dry. No rash noted. She is not diaphoretic. No erythema. No pallor.  Psychiatric: She has a normal mood and affect. Her behavior is normal.        Assessment & Plan:  Need for prophylactic vaccination and inoculation against influenza  Type II or unspecified type diabetes mellitus without mention of complication, not stated as uncontrolled - Plan: CBC with Differential, POCT glycosylated hemoglobin (Hb A1C), sitaGLIPtin (JANUVIA) 100 MG tablet  Essential hypertension, benign - Plan: Comprehensive metabolic panel  Anxiety and depression  Hepatomegaly  Need for hepatitis B vaccination  Sinusitis, acute maxillary  Wegener's granulomatosis  Need for prophylactic vaccination and inoculation against viral hepatitis - Plan: Hepatitis A vaccine adult IM  Need for prophylactic vaccination against Streptococcus pneumoniae (pneumococcus) - Plan: Pneumococcal polysaccharide vaccine 23-valent greater than or equal to 2yo subcutaneous/IM   1.  DMII: uncontrolled; increase Januvia to 100mg  daily.  Obtain labs. 2.  HTN: stable; obtain labs; no change in therapy. 3.  Anxiety and depression: stable; no change in therapy. 4. Acute Maxillary Sinusitis: New.  Improving slowly; monitor closely and contact office if lack of improvement. 5.  Hepatomegaly:  Persistent; s/p hepatology consultation; no further action or follow-up warranted unless LFTs acutely worsen.  Will start Hepatitis A series. 6.  Wegeners: stable; followed by rheumatology. 7. S/p Pneumovax. 8. S/p Hepatitis A#1; RTC six months for Hepatitis A#2.  9.  S/p flu vaccine. 10.  S/p Hepatitis B#3.  Meds ordered this encounter  Medications  . sitaGLIPtin (JANUVIA) 100 MG tablet    Sig: Take 1 tablet (100 mg total) by mouth daily. Take with food    Dispense:  30 tablet    Refill:  5   Nilda Simmer, M.D.  [facility] [address] [place], [place]  [address] [phone] phone [phone] fax

## 2013-10-04 ENCOUNTER — Encounter: Payer: Self-pay | Admitting: Family Medicine

## 2013-10-05 ENCOUNTER — Encounter: Payer: Self-pay | Admitting: Family Medicine

## 2013-10-05 MED ORDER — LEVOFLOXACIN 750 MG PO TABS: 750.0000 mg | ORAL_TABLET | Freq: Every day | ORAL | Status: DC

## 2013-11-08 ENCOUNTER — Other Ambulatory Visit: Payer: Self-pay

## 2013-11-08 MED ORDER — DULOXETINE HCL 60 MG PO CPEP: 60.0000 mg | ORAL_CAPSULE | Freq: Two times a day (BID) | ORAL | Status: DC

## 2013-11-08 NOTE — Telephone Encounter (Signed)
Dr Katrinka Blazing please review dosage on gabapentin. Your last OV notes state 100 mg tabs, 8 tabs Qhs, but pharm request and report when I called to check is that pt has been getting the 800 mg TID. I have pended RFs this way.

## 2013-11-09 MED ORDER — GABAPENTIN 800 MG PO TABS: 800.0000 mg | ORAL_TABLET | Freq: Three times a day (TID) | ORAL | Status: DC

## 2013-11-09 NOTE — Telephone Encounter (Signed)
I approved Gapabentin rx but I do not prescribe her Cellcept; pharmacy needs to contact Rheumatology at [facility] for refill of Cellcept.  Thanks.

## 2013-11-09 NOTE — Telephone Encounter (Signed)
Contacted pharmacist and asked him to send to Rheum. He stated that he had and they sent back asking it to be sent to PCP. Pharmacist will notifiy pt and have her get in touch w/Rheum.

## 2013-11-14 ENCOUNTER — Ambulatory Visit: Payer: Self-pay | Admitting: Family Medicine

## 2014-01-17 ENCOUNTER — Other Ambulatory Visit: Payer: Self-pay

## 2014-01-17 DIAGNOSIS — E039 Hypothyroidism, unspecified: Secondary | ICD-10-CM

## 2014-01-17 MED ORDER — LEVOTHYROXINE SODIUM 100 MCG PO TABS: 100.0000 ug | ORAL_TABLET | Freq: Every day | ORAL | Status: DC

## 2014-01-17 MED ORDER — LANSOPRAZOLE 30 MG PO CPDR: 30.0000 mg | DELAYED_RELEASE_CAPSULE | Freq: Every day | ORAL | Status: DC

## 2014-02-14 ENCOUNTER — Encounter: Payer: Self-pay | Admitting: Podiatry

## 2014-02-14 ENCOUNTER — Ambulatory Visit (INDEPENDENT_AMBULATORY_CARE_PROVIDER_SITE_OTHER): Payer: BC Managed Care – PPO

## 2014-02-14 ENCOUNTER — Ambulatory Visit (INDEPENDENT_AMBULATORY_CARE_PROVIDER_SITE_OTHER): Payer: BC Managed Care – PPO | Admitting: Podiatry

## 2014-02-14 VITALS — BP 128/84 | HR 103 | Resp 16 | Ht 65.0 in | Wt 264.0 lb

## 2014-02-14 DIAGNOSIS — M722 Plantar fascial fibromatosis: Secondary | ICD-10-CM

## 2014-02-14 DIAGNOSIS — M79673 Pain in unspecified foot: Secondary | ICD-10-CM

## 2014-02-14 DIAGNOSIS — M79609 Pain in unspecified limb: Secondary | ICD-10-CM

## 2014-02-14 DIAGNOSIS — M7750 Other enthesopathy of unspecified foot: Secondary | ICD-10-CM

## 2014-02-14 MED ORDER — DICLOFENAC SODIUM 75 MG PO TBEC: 75.0000 mg | DELAYED_RELEASE_TABLET | Freq: Two times a day (BID) | ORAL | Status: DC

## 2014-02-14 MED ORDER — TRIAMCINOLONE ACETONIDE 10 MG/ML IJ SUSP
10.0000 mg | Freq: Once | INTRAMUSCULAR | Status: AC
  Administered 2014-02-14: 10 mg

## 2014-02-14 NOTE — Progress Notes (Signed)
   Subjective:    Patient ID: Rachel Walls, female    DOB: 04/15/1966, 48 y.o.   MRN: [id number]  HPI Comments: N pain L right foot  Through out D 4 days O sudden C worse A walking T ice, ibuprofen   Foot Pain      Review of Systems  Musculoskeletal:       Joint pain Difficulty walking  All other systems reviewed and are negative.       Objective:   Physical Exam        Assessment & Plan:

## 2014-02-15 ENCOUNTER — Ambulatory Visit: Payer: BC Managed Care – PPO | Admitting: Family Medicine

## 2014-02-15 NOTE — Progress Notes (Signed)
Subjective:     Patient ID: Rachel Walls, female   DOB: 04/18/1966, 48 y.o.   MRN: [id number]  HPI patient presents stating I have been having a lot of pain in my right foot the last few days. I do not remember any specific injury but it is making it hard for me to walk. Hurts on top and now in the arch   Review of Systems     Objective:   Physical Exam Neurovascular status intact with range of motion adequate and no muscle strength issues noted. Patient is found to have discomfort in the dorsal ankle area mild discomfort in the sinus tarsi and quite a bit of discomfort in the mid arch area right with inflammation noted upon palpation    Assessment:     Tendinitis with plantar fasciitis and possibility for systemic inflammation    Plan:     Patient is noted to have a collapse of medial longitudinal arch and today I have recommended long-term orthotics which were scanned for this patient. I injected the dorsal tendon complex 3 mg Kenalog 5 mg Xylocaine Marcaine mixture place the patient in a fascially brace to lift up the plantar arch and placed on oral tear and 75 mg twice a day. Reappoint in several weeks

## 2014-02-20 ENCOUNTER — Encounter: Payer: Self-pay | Admitting: Family Medicine

## 2014-02-20 ENCOUNTER — Other Ambulatory Visit: Payer: Self-pay | Admitting: Family Medicine

## 2014-02-20 ENCOUNTER — Ambulatory Visit (INDEPENDENT_AMBULATORY_CARE_PROVIDER_SITE_OTHER): Payer: BC Managed Care – PPO | Admitting: Family Medicine

## 2014-02-20 VITALS — BP 146/97 | HR 109 | Temp 99.4°F | Resp 18 | Ht 64.75 in | Wt 265.6 lb

## 2014-02-20 DIAGNOSIS — IMO0001 Reserved for inherently not codable concepts without codable children: Secondary | ICD-10-CM

## 2014-02-20 DIAGNOSIS — R160 Hepatomegaly, not elsewhere classified: Secondary | ICD-10-CM

## 2014-02-20 DIAGNOSIS — F329 Major depressive disorder, single episode, unspecified: Secondary | ICD-10-CM

## 2014-02-20 DIAGNOSIS — E1149 Type 2 diabetes mellitus with other diabetic neurological complication: Secondary | ICD-10-CM

## 2014-02-20 DIAGNOSIS — F341 Dysthymic disorder: Secondary | ICD-10-CM

## 2014-02-20 DIAGNOSIS — J309 Allergic rhinitis, unspecified: Secondary | ICD-10-CM

## 2014-02-20 DIAGNOSIS — M3130 Wegener's granulomatosis without renal involvement: Secondary | ICD-10-CM

## 2014-02-20 DIAGNOSIS — I10 Essential (primary) hypertension: Secondary | ICD-10-CM

## 2014-02-20 DIAGNOSIS — E1142 Type 2 diabetes mellitus with diabetic polyneuropathy: Secondary | ICD-10-CM

## 2014-02-20 DIAGNOSIS — F419 Anxiety disorder, unspecified: Secondary | ICD-10-CM

## 2014-02-20 DIAGNOSIS — E1165 Type 2 diabetes mellitus with hyperglycemia: Principal | ICD-10-CM

## 2014-02-20 LAB — CBC WITH DIFFERENTIAL/PLATELET
Basophils Absolute: 0 10*3/uL (ref 0.0–0.1)
Basophils Relative: 0 % (ref 0–1)
EOS ABS: 0.3 10*3/uL (ref 0.0–0.7)
Eosinophils Relative: 3 % (ref 0–5)
HEMATOCRIT: 39 % (ref 36.0–46.0)
HEMOGLOBIN: 12.5 g/dL (ref 12.0–15.0)
LYMPHS ABS: 2 10*3/uL (ref 0.7–4.0)
Lymphocytes Relative: 18 % (ref 12–46)
MCH: 23.5 pg — AB (ref 26.0–34.0)
MCHC: 32.1 g/dL (ref 30.0–36.0)
MCV: 73.2 fL — ABNORMAL LOW (ref 78.0–100.0)
MONO ABS: 0.9 10*3/uL (ref 0.1–1.0)
MONOS PCT: 8 % (ref 3–12)
NEUTROS PCT: 71 % (ref 43–77)
Neutro Abs: 7.9 10*3/uL — ABNORMAL HIGH (ref 1.7–7.7)
Platelets: 400 10*3/uL (ref 150–400)
RBC: 5.33 MIL/uL — AB (ref 3.87–5.11)
RDW: 14.7 % (ref 11.5–15.5)
WBC: 11.1 10*3/uL — ABNORMAL HIGH (ref 4.0–10.5)

## 2014-02-20 LAB — COMPLETE METABOLIC PANEL WITH GFR
ALBUMIN: 3.9 g/dL (ref 3.5–5.2)
ALT: 16 U/L (ref 0–35)
AST: 16 U/L (ref 0–37)
Alkaline Phosphatase: 79 U/L (ref 39–117)
BUN: 17 mg/dL (ref 6–23)
CO2: 26 meq/L (ref 19–32)
Calcium: 9.2 mg/dL (ref 8.4–10.5)
Chloride: 97 mEq/L (ref 96–112)
Creat: 0.86 mg/dL (ref 0.50–1.10)
GFR, EST NON AFRICAN AMERICAN: 80 mL/min
GLUCOSE: 320 mg/dL — AB (ref 70–99)
POTASSIUM: 4.5 meq/L (ref 3.5–5.3)
Sodium: 132 mEq/L — ABNORMAL LOW (ref 135–145)
Total Bilirubin: 0.3 mg/dL (ref 0.2–1.2)
Total Protein: 7.1 g/dL (ref 6.0–8.3)

## 2014-02-20 LAB — POCT GLYCOSYLATED HEMOGLOBIN (HGB A1C): Hemoglobin A1C: 10.6

## 2014-02-20 LAB — GLUCOSE, POCT (MANUAL RESULT ENTRY): POC Glucose: 333 mg/dl — AB (ref 70–99)

## 2014-02-20 MED ORDER — GLIPIZIDE ER 5 MG PO TB24: 5.0000 mg | ORAL_TABLET | Freq: Every day | ORAL | Status: DC

## 2014-02-20 NOTE — Progress Notes (Signed)
Subjective:    Patient ID: Rachel Walls, female    DOB: 09/28/1966, 48 y.o.   MRN: [id number]  02/20/2014  Diabetes   HPI This 48 y.o. female presents for three month follow-up of the following:  1.  DMII: three month follow-up; not checking sugars; increased Januvia at 185m daily.  Needs strips for One Touch Ultra Mini test strips and lancets.  Feeling poorly today.  Patient reports good compliance with medication, good tolerance to medication, and good symptom control.    2. HTN:  Not checking BP at home.Patient reports good compliance with medication, good tolerance to medication, and good symptom control.  Denies CP/palp/SOB/leg swelling; denies HA/dizziness/focal weakness/paresthesias.   3. Arthralgias:  Lower back pain dull x this morning; R elbow pain x 3 weeks; s/p ortho consultation; s/p xray of R elbow.   Tennis elbow; treating with PT.  Also wore wrist brace.  R handed.  S/p podiatry consult last week; s/p injections in foot; rx for orthotics; diffuse pain in foot.  Temperature today is 99.4; has bad days; called out of work today; occurs 1-2 days per month.  If overdoes, will pay for it.  Onset over past year.  Naps on these days.  Woke up this morning and very shaky; went right back to bed; did not check BP or sugar.   4.  Wegner's: does not see rheumatology until May 2015.  Feels like fatigue, arthralgias are due to Wegener's.    5. Hypersomnolence:  Not willing to undergo sleep study; willing in summer months.  Gets out of school May 04, 2014.    6.  Hepatomegaly:  No follow-up with hepatitis MD unless LFTs worsen. S/p consultation with hepatologist.    7.  Depression and anxiety: good control with Cymbalta; has decreased Cymbalta to one tablet daily and doing well emotionally.  Denies panic attacks, excessive worry, depressive symptoms.  8.  PN diabeteic: controlled on medication currently.    9. Allergic Rhinitis: stable; currently.  Not taking medication regularly  at this time.   Review of Systems  Constitutional: Positive for fatigue. Negative for fever, chills and diaphoresis.  HENT: Negative for congestion, postnasal drip and sore throat.   Respiratory: Negative for cough, shortness of breath and stridor.   Cardiovascular: Negative for chest pain, palpitations and leg swelling.  Gastrointestinal: Negative for nausea, vomiting, abdominal pain and diarrhea.  Endocrine: Negative for cold intolerance, heat intolerance, polydipsia, polyphagia and polyuria.  Musculoskeletal: Positive for arthralgias and back pain.  Skin: Negative for color change, pallor and rash.  Neurological: Negative for dizziness, syncope, facial asymmetry, speech difficulty, weakness, light-headedness, numbness and headaches.  Psychiatric/Behavioral: Negative for sleep disturbance and dysphoric mood. The patient is not nervous/anxious.     Past Medical History  Diagnosis Date  . Wegener's granulomatosis   . GERD (gastroesophageal reflux disease)   . Hypothyroidism   . Diabetes mellitus     Type 2  . Psychic factors associated with disease     Psychogenic factors with other diseases  . Hypertension   . Nonspecific elevation of levels of transaminase or lactic acid dehydrogenase (LDH)   . Other and unspecified hyperlipidemia   . Allergic rhinitis, cause unspecified   . Unspecified vitamin D deficiency   . Obesity, unspecified   . Hypertensive retinopathy   . Venous engorgement of retina   . Chronic kidney disease   . Anemia   . Anxiety    Allergies  Allergen Reactions  . Penicillins Hives  .  Sulfonamide Derivatives     Reaction: arthralgias.   Current Outpatient Prescriptions  Medication Sig Dispense Refill  . diclofenac (VOLTAREN) 75 MG EC tablet Take 1 tablet (75 mg total) by mouth 2 (two) times daily.  50 tablet  2  . DULoxetine (CYMBALTA) 60 MG capsule Take 1 capsule (60 mg total) by mouth 2 (two) times daily.  60 capsule  4  . fluticasone (FLONASE) 50  MCG/ACT nasal spray Place 2 sprays into the nose daily.  16 g  11  . gabapentin (NEURONTIN) 800 MG tablet Take 1 tablet (800 mg total) by mouth 3 (three) times daily.  90 tablet  4  . levothyroxine (SYNTHROID, LEVOTHROID) 100 MCG tablet Take 1 tablet (100 mcg total) by mouth daily.  30 tablet  2  . loratadine (CLARITIN) 10 MG tablet Take 10 mg by mouth daily as needed.       . metFORMIN (GLUCOPHAGE) 500 MG tablet Take 1 tablet (500 mg total) by mouth every evening. With food.  60 tablet  5  . mycophenolate (CELLCEPT) 250 MG capsule Take 250 mg by mouth at bedtime. 2 tab in the morning & 1 tab at night.      . mycophenolate (CELLCEPT) 500 MG tablet Take 500 mg by mouth every morning.        . ramipril (ALTACE) 5 MG capsule Take 1 capsule (5 mg total) by mouth daily.  90 capsule  3  . sitaGLIPtin (JANUVIA) 100 MG tablet Take 1 tablet (100 mg total) by mouth daily. Take with food  30 tablet  5  . glipiZIDE (GLUCOTROL XL) 5 MG 24 hr tablet Take 1 tablet (5 mg total) by mouth daily with breakfast.  90 tablet  1  . ipratropium (ATROVENT) 0.03 % nasal spray Place 2 sprays into the nose 2 (two) times daily.  30 mL  5  . lansoprazole (PREVACID) 30 MG capsule Take 1 capsule (30 mg total) by mouth daily.  90 capsule  1   No current facility-administered medications for this visit.   History   Social History  . Marital Status: Married    Spouse Name: N/A    Number of Children: 0  . Years of Education: college   Occupational History  . TEACHER     Full time   Social History Main Topics  . Smoking status: Never Smoker   . Smokeless tobacco: Never Used     Comment: Tobacco use-no  . Alcohol Use: No  . Drug Use: No  . Sexual Activity: Yes    Partners: Male   Other Topics Concern  . Not on file   Social History Narrative   No regular exercise.       Always uses seat belts.       Smoke alarm in the home.       Caffeine use: None.    Married x 23 years;happily married; no abuse.       Children: none      Lives: with husband, 2 cats, 1 dogs.      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      Tobacco: never      Alcohol: never      Drugs: none             Objective:    BP 146/97  Pulse 109  Temp(Src) 99.4 F (37.4 C) (Oral)  Resp 18  Ht 5' 4.75" (1.645 m)  Wt  265 lb 9.6 oz (120.475 kg)  BMI 44.52 kg/m2  SpO2 94%  LMP 01/13/2014 Physical Exam  Constitutional: She is oriented to person, place, and time. She appears well-developed and well-nourished. No distress.  obese  HENT:  Head: Normocephalic and atraumatic.  Right Ear: External ear normal.  Left Ear: External ear normal.  Nose: Nose normal.  Mouth/Throat: Oropharynx is clear and moist.  Eyes: Conjunctivae and EOM are normal. Pupils are equal, round, and reactive to light.  Neck: Normal range of motion. Neck supple. Carotid bruit is not present. No thyromegaly present.  Cardiovascular: Normal rate, regular rhythm, normal heart sounds and intact distal pulses.  Exam reveals no gallop and no friction rub.   No murmur heard. Pulmonary/Chest: Effort normal and breath sounds normal. She has no wheezes. She has no rales.  Abdominal: Soft. Bowel sounds are normal. She exhibits no distension and no mass. There is no tenderness. There is no rebound and no guarding.  Lymphadenopathy:    She has no cervical adenopathy.  Neurological: She is alert and oriented to person, place, and time. No cranial nerve deficit.  Skin: Skin is warm and dry. No rash noted. She is not diaphoretic. No erythema. No pallor.  Psychiatric: She has a normal mood and affect. Her behavior is normal.   Results for orders placed in visit on 02/20/14  CBC WITH DIFFERENTIAL      Result Value Ref Range   WBC 11.1 (*) 4.0 - 10.5 K/uL   RBC 5.33 (*) 3.87 - 5.11 MIL/uL   Hemoglobin 12.5  12.0 - 15.0 g/dL   HCT 39.0  36.0 - 46.0 %   MCV 73.2 (*) 78.0 - 100.0 fL   MCH 23.5 (*) 26.0 - 34.0 pg    MCHC 32.1  30.0 - 36.0 g/dL   RDW 14.7  11.5 - 15.5 %   Platelets 400  150 - 400 K/uL   Neutrophils Relative % 71  43 - 77 %   Neutro Abs 7.9 (*) 1.7 - 7.7 K/uL   Lymphocytes Relative 18  12 - 46 %   Lymphs Abs 2.0  0.7 - 4.0 K/uL   Monocytes Relative 8  3 - 12 %   Monocytes Absolute 0.9  0.1 - 1.0 K/uL   Eosinophils Relative 3  0 - 5 %   Eosinophils Absolute 0.3  0.0 - 0.7 K/uL   Basophils Relative 0  0 - 1 %   Basophils Absolute 0.0  0.0 - 0.1 K/uL   Smear Review Criteria for review not met    COMPLETE METABOLIC PANEL WITH GFR      Result Value Ref Range   Sodium 132 (*) 135 - 145 mEq/L   Potassium 4.5  3.5 - 5.3 mEq/L   Chloride 97  96 - 112 mEq/L   CO2 26  19 - 32 mEq/L   Glucose, Bld 320 (*) 70 - 99 mg/dL   BUN 17  6 - 23 mg/dL   Creat 0.86  0.50 - 1.10 mg/dL   Total Bilirubin 0.3  0.2 - 1.2 mg/dL   Alkaline Phosphatase 79  39 - 117 U/L   AST 16  0 - 37 U/L   ALT 16  0 - 35 U/L   Total Protein 7.1  6.0 - 8.3 g/dL   Albumin 3.9  3.5 - 5.2 g/dL   Calcium 9.2  8.4 - 10.5 mg/dL   GFR, Est African American >89     GFR, Est Non African American  80    POCT GLYCOSYLATED HEMOGLOBIN (HGB A1C)      Result Value Ref Range   Hemoglobin A1C 10.6    GLUCOSE, POCT (MANUAL RESULT ENTRY)      Result Value Ref Range   POC Glucose 333 (*) 70 - 99 mg/dl       Assessment & Plan:  Type II or unspecified type diabetes mellitus without mention of complication, uncontrolled - Plan: POCT glycosylated hemoglobin (Hb A1C), CBC with Differential, COMPLETE METABOLIC PANEL WITH GFR, POCT glucose (manual entry)  Essential hypertension, benign - Plan: CBC with Differential, COMPLETE METABOLIC PANEL WITH GFR  Hepatomegaly  Allergic rhinitis  Wegener's granulomatosis  Diabetic peripheral neuropathy  Anxiety and depression  1. DMII: uncontrolled/worsening; add Glucotrol XL 32m daily.  Refills of test strips and lancets called into pharmacy and advised pt to start checking sugars bid. 2.   HTN: borderline today; recommend checking BP weekly at home; also recommend exercise, weight loss. 3.  Hepatomegaly: stable; obtain LFTs; s/p hepatology consultation; will warrant Hepatitis A#2 vaccine at next visit. 4.  Allergic Rhinitis: stable; recommend starting medications daily for the next three months. 5.  Diabetic PN: stable/controlled; no change in therapy. 6. Anxiety and depression: stable/controlled on Cymbalta 685mdaily. 7. Wegener's:  Stable; currently suffering with arthralgias, fatigue/malaise.  Appointment in 03/2014 with rheumatology/nephrology; recommend discussing current symptomatology with them.  Hyperglycemia also likely contributing to fatigue and malaise.  Meds ordered this encounter  Medications  . glipiZIDE (GLUCOTROL XL) 5 MG 24 hr tablet    Sig: Take 1 tablet (5 mg total) by mouth daily with breakfast.    Dispense:  90 tablet    Refill:  1    Return in about 3 months (around 05/23/2014) for complete physical examiniation.   KrReginia FortsM.D.  [facility]  [phone]

## 2014-02-20 NOTE — Patient Instructions (Signed)
1. You will need Hepatitis A#2 at next visit in three months.

## 2014-02-21 MED ORDER — LANSOPRAZOLE 30 MG PO CPDR: 30.0000 mg | DELAYED_RELEASE_CAPSULE | Freq: Every day | ORAL | Status: DC

## 2014-02-21 NOTE — Telephone Encounter (Signed)
Dr Katrinka BlazingSmith, I don't see GERD specifically addressed at OV or prev one. OK to RF?

## 2014-02-22 ENCOUNTER — Encounter: Payer: Self-pay | Admitting: Family Medicine

## 2014-02-23 ENCOUNTER — Encounter: Payer: Self-pay | Admitting: Family Medicine

## 2014-02-28 ENCOUNTER — Encounter: Payer: Self-pay | Admitting: Podiatry

## 2014-02-28 ENCOUNTER — Ambulatory Visit (INDEPENDENT_AMBULATORY_CARE_PROVIDER_SITE_OTHER): Payer: BC Managed Care – PPO | Admitting: Podiatry

## 2014-02-28 VITALS — BP 108/74 | HR 96 | Resp 12

## 2014-02-28 DIAGNOSIS — M722 Plantar fascial fibromatosis: Secondary | ICD-10-CM

## 2014-02-28 NOTE — Progress Notes (Signed)
Subjective:     Patient ID: Rachel Walls, female   DOB: 05/14/1966, 48 y.o.   MRN: [id number]  HPI patient presents stating her foot is feeling better but she is starting to get some mild heel pain left   Review of Systems     Objective:   Physical Exam Neurovascular status intact with discomfort of the heel region that is mild in intensity but present in both heels    Assessment:     Plantar fasciitis of heel region both feet    Plan:     Dispensed orthotics with instructions and discussed physical therapy anti-inflammatory and shoe gear modifications. Reappoint 6 weeks

## 2014-02-28 NOTE — Patient Instructions (Signed)

## 2014-03-10 LAB — HM DIABETES EYE EXAM

## 2014-03-30 ENCOUNTER — Other Ambulatory Visit: Payer: Self-pay

## 2014-03-30 DIAGNOSIS — I10 Essential (primary) hypertension: Secondary | ICD-10-CM

## 2014-03-30 MED ORDER — RAMIPRIL 5 MG PO CAPS: 5.0000 mg | ORAL_CAPSULE | Freq: Every day | ORAL | Status: DC

## 2014-03-30 NOTE — Telephone Encounter (Signed)
Received request for refill.  [facility]

## 2014-04-11 ENCOUNTER — Ambulatory Visit: Payer: BC Managed Care – PPO | Admitting: Podiatry

## 2014-04-20 ENCOUNTER — Other Ambulatory Visit: Payer: Self-pay

## 2014-04-20 DIAGNOSIS — E119 Type 2 diabetes mellitus without complications: Secondary | ICD-10-CM

## 2014-04-20 MED ORDER — SITAGLIPTIN PHOSPHATE 100 MG PO TABS: 100.0000 mg | ORAL_TABLET | Freq: Every day | ORAL | Status: DC

## 2014-05-22 ENCOUNTER — Other Ambulatory Visit: Payer: Self-pay | Admitting: *Deleted

## 2014-05-22 DIAGNOSIS — E039 Hypothyroidism, unspecified: Secondary | ICD-10-CM

## 2014-05-22 MED ORDER — LEVOTHYROXINE SODIUM 100 MCG PO TABS: 100.0000 ug | ORAL_TABLET | Freq: Every day | ORAL | Status: DC

## 2014-05-29 ENCOUNTER — Encounter: Payer: Self-pay | Admitting: Family Medicine

## 2014-05-29 ENCOUNTER — Ambulatory Visit (INDEPENDENT_AMBULATORY_CARE_PROVIDER_SITE_OTHER): Payer: BC Managed Care – PPO | Admitting: Family Medicine

## 2014-05-29 VITALS — BP 120/92 | HR 83 | Temp 98.4°F | Resp 16 | Ht 64.75 in | Wt 272.0 lb

## 2014-05-29 DIAGNOSIS — J3089 Other allergic rhinitis: Secondary | ICD-10-CM

## 2014-05-29 DIAGNOSIS — J302 Other seasonal allergic rhinitis: Secondary | ICD-10-CM

## 2014-05-29 DIAGNOSIS — E039 Hypothyroidism, unspecified: Secondary | ICD-10-CM

## 2014-05-29 DIAGNOSIS — E7800 Pure hypercholesterolemia, unspecified: Secondary | ICD-10-CM

## 2014-05-29 DIAGNOSIS — I10 Essential (primary) hypertension: Secondary | ICD-10-CM

## 2014-05-29 DIAGNOSIS — E119 Type 2 diabetes mellitus without complications: Secondary | ICD-10-CM

## 2014-05-29 DIAGNOSIS — Z0000 Encounter for general adult medical examination without abnormal findings: Secondary | ICD-10-CM

## 2014-05-29 DIAGNOSIS — Z23 Encounter for immunization: Secondary | ICD-10-CM

## 2014-05-29 LAB — LIPID PANEL
Cholesterol: 216 mg/dL — ABNORMAL HIGH (ref 0–200)
HDL: 41 mg/dL (ref >39)
LDL CALC: 132 mg/dL — AB (ref 0–99)
TRIGLYCERIDES: 216 mg/dL — AB (ref <150)
Total CHOL/HDL Ratio: 5.3 Ratio
VLDL: 43 mg/dL — AB (ref 0–40)

## 2014-05-29 LAB — COMPLETE METABOLIC PANEL WITH GFR
ALBUMIN: 3.5 g/dL (ref 3.5–5.2)
ALT: 17 U/L (ref 0–35)
AST: 13 U/L (ref 0–37)
Alkaline Phosphatase: 62 U/L (ref 39–117)
BUN: 13 mg/dL (ref 6–23)
CO2: 24 meq/L (ref 19–32)
Calcium: 8.3 mg/dL — ABNORMAL LOW (ref 8.4–10.5)
Chloride: 103 mEq/L (ref 96–112)
Creat: 0.8 mg/dL (ref 0.50–1.10)
GFR, Est Non African American: 87 mL/min
Glucose, Bld: 209 mg/dL — ABNORMAL HIGH (ref 70–99)
POTASSIUM: 4.4 meq/L (ref 3.5–5.3)
SODIUM: 138 meq/L (ref 135–145)
TOTAL PROTEIN: 6.1 g/dL (ref 6.0–8.3)
Total Bilirubin: 0.4 mg/dL (ref 0.2–1.2)

## 2014-05-29 LAB — POCT URINALYSIS DIPSTICK
Bilirubin, UA: NEGATIVE
Blood, UA: NEGATIVE
Glucose, UA: 500
KETONES UA: NEGATIVE
Leukocytes, UA: NEGATIVE
Nitrite, UA: POSITIVE
PH UA: 5
Protein, UA: NEGATIVE
Spec Grav, UA: 1.025
Urobilinogen, UA: 0.2

## 2014-05-29 LAB — CBC WITH DIFFERENTIAL/PLATELET
Basophils Absolute: 0 10*3/uL (ref 0.0–0.1)
Basophils Relative: 0 % (ref 0–1)
EOS ABS: 0.2 10*3/uL (ref 0.0–0.7)
Eosinophils Relative: 3 % (ref 0–5)
HCT: 34.5 % — ABNORMAL LOW (ref 36.0–46.0)
HEMOGLOBIN: 11 g/dL — AB (ref 12.0–15.0)
LYMPHS ABS: 1.3 10*3/uL (ref 0.7–4.0)
LYMPHS PCT: 19 % (ref 12–46)
MCH: 23 pg — ABNORMAL LOW (ref 26.0–34.0)
MCHC: 31.9 g/dL (ref 30.0–36.0)
MCV: 72 fL — ABNORMAL LOW (ref 78.0–100.0)
MONOS PCT: 8 % (ref 3–12)
Monocytes Absolute: 0.6 10*3/uL (ref 0.1–1.0)
NEUTROS ABS: 4.9 10*3/uL (ref 1.7–7.7)
NEUTROS PCT: 70 % (ref 43–77)
PLATELETS: 317 10*3/uL (ref 150–400)
RBC: 4.79 MIL/uL (ref 3.87–5.11)
RDW: 16.5 % — ABNORMAL HIGH (ref 11.5–15.5)
WBC: 7 10*3/uL (ref 4.0–10.5)

## 2014-05-29 LAB — POCT GLYCOSYLATED HEMOGLOBIN (HGB A1C): Hemoglobin A1C: 9.5

## 2014-05-29 LAB — TSH: TSH: 1.807 u[IU]/mL (ref 0.350–4.500)

## 2014-05-29 LAB — MICROALBUMIN, URINE: Microalb, Ur: 3.68 mg/dL — ABNORMAL HIGH (ref 0.00–1.89)

## 2014-05-29 MED ORDER — GLIPIZIDE ER 10 MG PO TB24: 10.0000 mg | ORAL_TABLET | Freq: Every day | ORAL | Status: DC

## 2014-05-29 NOTE — Progress Notes (Signed)
   Subjective:    Patient ID: Rachel HighlandBeverly A Atkins, female    DOB: 04/29/1966, 48 y.o.   MRN: [id number]  HPI    Review of Systems  Constitutional: Negative.   HENT: Positive for postnasal drip.   Eyes: Positive for photophobia.  Respiratory: Positive for cough.   Cardiovascular: Negative.   Gastrointestinal: Negative.   Endocrine: Negative.   Genitourinary: Negative.   Musculoskeletal: Negative.   Skin: Negative.   Allergic/Immunologic: Negative.   Neurological: Negative.   Hematological: Negative.   Psychiatric/Behavioral: Negative.        Objective:   Physical Exam        Assessment & Plan:

## 2014-05-29 NOTE — Progress Notes (Signed)
Subjective:  This chart was scribed for  Rachel Forts, MD  by Stacy Gardner, [facility] Scribe. The patient was seen in room and the patient's care was started at 11:18 AM. ] Patient ID: Rachel Walls, female    DOB: 03-Nov-1966, 48 y.o.   MRN: [id number]  05/29/2014  Annual Exam, Tendonitis and Diabetes   HPI HPI Comments: Rachel Walls is a 48 y.o. female who arrives to the [facility] for an annual exam. Pt's last physical was 02/2013. Her last menses was two months ago. Her menses are becoming more irregular. Pt's menses usually lasts 7-10 and are not heavy. Pt mentions during her menses she only has abdominal cramps for one day.    She went the orthopedist for her bilateral ankle pain and was treated for tendonitis.  She was given prednisone and has noticed an improvement. Pt wears a boot and brace. She did not wear her boot/brace while on vacation last week.  Pt was seen by Dr. Cristal Deer. Pt has elevated glucose level before and after taking the prednisone. She is taking her medications as instructed.  Denies chest pain and heart palpitations. Denies any trouble hearing or tinnitus. Denies visual changes. Denies SOB. Denies numbness or tingling that is not related to the tendonitis.  Denies neck or shoulder pain. Denies heart burn. Denies nausea, vomiting, diarrhea, and hematochezia. She has intermittent urinary incontinence. Denies bowel incontinence. She did not eat today. Pt has self breast exams often.   She recently had a mammogram. Pt had a colonscopy 2012. Her tetanus shot was this year. Pt's Hepatitis B is UTD. She had a recent visit to the ophthalmologist  and dentist. She went to her reumatologist in two months ago.   Pt is married for almost 24 years. Denies any abuse.   Pt's mother is 91 and does not have any health problems other than arthritis and bursitis. Pt's brother has a hx of asthma. Denies of any hx of breast  cancer.  She teaches 6 grade math. Denies the use of alcohol. She participates in [place] and line dancing as exercise.   Review of Systems  Constitutional: Negative for appetite change and unexpected weight change.  HENT: Negative for tinnitus.   Eyes: Negative for visual disturbance.  Respiratory: Positive for cough. Negative for shortness of breath.   Cardiovascular: Positive for leg swelling. Negative for chest pain and palpitations.  Gastrointestinal: Negative for nausea, vomiting, diarrhea, constipation and blood in stool.  Genitourinary: Negative for enuresis and menstrual problem.  Musculoskeletal: Positive for arthralgias, gait problem, joint swelling and myalgias.  Neurological: Negative for dizziness, weakness and headaches.  Psychiatric/Behavioral: Negative for confusion.    Past Medical History  Diagnosis Date  . Wegener's granulomatosis   . GERD (gastroesophageal reflux disease)   . Hypothyroidism   . Diabetes mellitus     Type 2  . Psychic factors associated with disease     Psychogenic factors with other diseases  . Hypertension   . Nonspecific elevation of levels of transaminase or lactic acid dehydrogenase (LDH)   . Other and unspecified hyperlipidemia   . Allergic rhinitis, cause unspecified   . Unspecified vitamin D deficiency   . Obesity, unspecified   . Hypertensive retinopathy   . Venous engorgement of retina   . Chronic kidney disease   . Anemia   . Anxiety   . Asthma    Past Surgical History  Procedure Laterality Date  .  Calcaneous bone spur surgery      x2  . Tonsillectomy    . Renal biopsy  1999  . Lung biopsy  1999  . 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 Abdominal ultrasound  05/17/2012    severe hepatomegaly at 22 cm; gallbladder normal.   [facility].  . Esophagogastroduodenoscopy  08/19/12    diffuse gastritis antrum; pathology negative for malignancy, H. Pylori; esophagus and duodenum normal.  . Ct abdomen  06/10/12     Hepatomegaly.  [place].  [facility] scan  08/27/12    normal; EF 62%.  [facility].  . Meniscus tear      L; x 3 tears.  [place].    Allergies  Allergen Reactions  . Penicillins Hives  . Sulfonamide Derivatives     Reaction: arthralgias.   Current Outpatient Prescriptions  Medication Sig Dispense Refill  . DULoxetine (CYMBALTA) 60 MG capsule Take 1 capsule (60 mg total) by mouth 2 (two) times daily.  60 capsule  5  . fluticasone (FLONASE) 50 MCG/ACT nasal spray Place 2 sprays into both nostrils daily.  16 g  11  . gabapentin (NEURONTIN) 800 MG tablet Take 1 tablet (800 mg total) by mouth 3 (three) times daily.  90 tablet  11  . glipiZIDE (GLUCOTROL XL) 10 MG 24 hr tablet Take 1 tablet (10 mg total) by mouth daily with breakfast.  30 tablet  11  . ipratropium (ATROVENT) 0.03 % nasal spray Place 2 sprays into the nose 2 (two) times daily.  30 mL  5  . lansoprazole (PREVACID) 30 MG capsule Take 1 capsule (30 mg total) by mouth daily.  90 capsule  1  . levothyroxine (SYNTHROID, LEVOTHROID) 100 MCG tablet Take 1 tablet (100 mcg total) by mouth daily.  30 tablet  11  . loratadine (CLARITIN) 10 MG tablet Take 10 mg by mouth daily as needed.       . metFORMIN (GLUCOPHAGE) 500 MG tablet Take 1 tablet (500 mg total) by mouth every evening. With food.  30 tablet  11  . mycophenolate (CELLCEPT) 250 MG capsule Take 250 mg by mouth at bedtime. 2 tab in the morning & 1 tab at night.      . mycophenolate (CELLCEPT) 500 MG tablet Take 500 mg by mouth every morning.        . ramipril (ALTACE) 5 MG capsule Take 1 capsule (5 mg total) by mouth daily.  30 capsule  11  . sitaGLIPtin (JANUVIA) 100 MG tablet Take 1 tablet (100 mg total) by mouth daily. Take with food  30 tablet  11   No current facility-administered medications for this visit.   History   Social History  . Marital Status: Married    Spouse Name: N/A    Number of Children: 0  . Years of Education: college   Occupational History   . TEACHER     Full time   Social History Main Topics  . Smoking status: Never Smoker   . Smokeless tobacco: Never Used     Comment: Tobacco use-no  . Alcohol Use: No  . Drug Use: No  . Sexual Activity: Yes    Partners: Male   Other Topics Concern  . Not on file   Social History Narrative   No regular exercise.       Always uses seat belts.       Smoke alarm in the home.  Caffeine use: None.    Married x 24 years;happily married; no abuse.      Children: none      Lives: with husband, 2 cats, 1 dogs.      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      Tobacco: never      Alcohol: never      Drugs: none            Family History  Problem Relation Age of Onset  . Hyperlipidemia Mother     Hypercholesterolemia  . Arthritis Mother     OA  . Sarcoidosis Father   . Heart disease Maternal Grandfather   . Emphysema Maternal Grandfather     Cause of death  . Diabetes Maternal Grandfather   . Heart failure Paternal Grandfather     CHF  . Heart disease Paternal Grandfather   . Asthma Brother   . Alcohol abuse Brother   . Emphysema Maternal Grandmother        Objective:    BP 120/92  Pulse 83  Temp(Src) 98.4 F (36.9 C) (Oral)  Resp 16  Ht 5' 4.75" (1.645 m)  Wt 272 lb (123.378 kg)  BMI 45.59 kg/m2  SpO2 97%  LMP 04/07/2014 DIAGNOSTIC STUDIES: Oxygen Saturation is 97% on room air, normal by my interpretation.    COORDINATION OF CARE:  11:18 AM Discussed course of care with pt . Pt understands and agrees.     Physical Exam  Nursing note and vitals reviewed. Constitutional: She is oriented to person, place, and time. She appears well-developed and well-nourished. No distress.  HENT:  Head: Normocephalic and atraumatic.  Right Ear: External ear normal.  Left Ear: External ear normal.  Nose: Nose normal.  Mouth/Throat: Oropharynx is clear and moist.  Eyes: Conjunctivae and EOM are normal. Pupils are  equal, round, and reactive to light.  Neck: Normal range of motion and full passive range of motion without pain. Neck supple. No JVD present. Carotid bruit is not present. No tracheal deviation present. No thyromegaly present.  Cardiovascular: Normal rate, regular rhythm and normal heart sounds.  Exam reveals no gallop and no friction rub.   No murmur heard. Pulmonary/Chest: Effort normal and breath sounds normal. No respiratory distress. She has no wheezes. She has no rales. Right breast exhibits no inverted nipple, no mass, no nipple discharge, no skin change and no tenderness. Left breast exhibits no inverted nipple, no mass, no nipple discharge, no skin change and no tenderness. Breasts are symmetrical.  Abdominal: Soft. Bowel sounds are normal. She exhibits no distension and no mass. There is no tenderness. There is no rebound and no guarding.  Genitourinary: Vagina normal and uterus normal. There is no rash, tenderness or lesion on the right labia. There is no rash, tenderness or lesion on the left labia. Cervix exhibits no motion tenderness. Right adnexum displays no mass, no tenderness and no fullness. Left adnexum displays no mass, no tenderness and no fullness.  Musculoskeletal: Normal range of motion.       Right shoulder: Normal.       Left shoulder: Normal.       Cervical back: Normal.  Lymphadenopathy:    She has no cervical adenopathy.  Neurological: She is alert and oriented to person, place, and time. She has normal reflexes. No cranial nerve deficit. She exhibits normal muscle tone. Coordination normal.  Skin: Skin is warm and dry. No rash noted. She is  not diaphoretic. No erythema. No pallor.  Psychiatric: She has a normal mood and affect. Her behavior is normal. Judgment and thought content normal.   Results for orders placed in visit on 05/29/14  CBC WITH DIFFERENTIAL      Result Value Ref Range   WBC 7.0  4.0 - 10.5 K/uL   RBC 4.79  3.87 - 5.11 MIL/uL   Hemoglobin 11.0  (*) 12.0 - 15.0 g/dL   HCT 34.5 (*) 36.0 - 46.0 %   MCV 72.0 (*) 78.0 - 100.0 fL   MCH 23.0 (*) 26.0 - 34.0 pg   MCHC 31.9  30.0 - 36.0 g/dL   RDW 16.5 (*) 11.5 - 15.5 %   Platelets 317  150 - 400 K/uL   Neutrophils Relative % 70  43 - 77 %   Neutro Abs 4.9  1.7 - 7.7 K/uL   Lymphocytes Relative 19  12 - 46 %   Lymphs Abs 1.3  0.7 - 4.0 K/uL   Monocytes Relative 8  3 - 12 %   Monocytes Absolute 0.6  0.1 - 1.0 K/uL   Eosinophils Relative 3  0 - 5 %   Eosinophils Absolute 0.2  0.0 - 0.7 K/uL   Basophils Relative 0  0 - 1 %   Basophils Absolute 0.0  0.0 - 0.1 K/uL   Smear Review Criteria for review not met    COMPLETE METABOLIC PANEL WITH GFR      Result Value Ref Range   Sodium 138  135 - 145 mEq/L   Potassium 4.4  3.5 - 5.3 mEq/L   Chloride 103  96 - 112 mEq/L   CO2 24  19 - 32 mEq/L   Glucose, Bld 209 (*) 70 - 99 mg/dL   BUN 13  6 - 23 mg/dL   Creat 0.80  0.50 - 1.10 mg/dL   Total Bilirubin 0.4  0.2 - 1.2 mg/dL   Alkaline Phosphatase 62  39 - 117 U/L   AST 13  0 - 37 U/L   ALT 17  0 - 35 U/L   Total Protein 6.1  6.0 - 8.3 g/dL   Albumin 3.5  3.5 - 5.2 g/dL   Calcium 8.3 (*) 8.4 - 10.5 mg/dL   GFR, Est African American >89     GFR, Est Non African American 87    LIPID PANEL      Result Value Ref Range   Cholesterol 216 (*) 0 - 200 mg/dL   Triglycerides 216 (*) <150 mg/dL   HDL 41  >39 mg/dL   Total CHOL/HDL Ratio 5.3     VLDL 43 (*) 0 - 40 mg/dL   LDL Cholesterol 132 (*) 0 - 99 mg/dL  TSH      Result Value Ref Range   TSH 1.807  0.350 - 4.500 uIU/mL  MICROALBUMIN, URINE      Result Value Ref Range   Microalb, Ur 3.68 (*) 0.00 - 1.89 mg/dL  POCT GLYCOSYLATED HEMOGLOBIN (HGB A1C)      Result Value Ref Range   Hemoglobin A1C 9.5    POCT URINALYSIS DIPSTICK      Result Value Ref Range   Color, UA yellow     Clarity, UA clear     Glucose, UA 500     Bilirubin, UA neg     Ketones, UA neg     Spec Grav, UA 1.025     Blood, UA neg     pH, UA 5.0  Protein, UA  neg     Urobilinogen, UA 0.2     Nitrite, UA pos     Leukocytes, UA Negative    PAP IG AND HPV HIGH-RISK      Result Value Ref Range   HPV DNA High Risk Not Detected     Specimen adequacy: SEE NOTE     FINAL DIAGNOSIS: SEE NOTE     Cytotechnologist: SEE NOTE         Assessment & Plan:  Routine general medical examination at a health care facility - Plan: CBC with Differential, COMPLETE METABOLIC PANEL WITH GFR, POCT glycosylated hemoglobin (Hb A1C), Lipid panel, TSH, Microalbumin, urine, POCT urinalysis dipstick, EKG 12-Lead, Pap IG and HPV (high risk) DNA detection  Essential hypertension, benign - Plan: CBC with Differential, COMPLETE METABOLIC PANEL WITH GFR, TSH, POCT urinalysis dipstick, EKG 12-Lead, ramipril (ALTACE) 5 MG capsule  Pure hypercholesterolemia - Plan: Lipid panel  Type II or unspecified type diabetes mellitus without mention of complication, not stated as uncontrolled - Plan: POCT glycosylated hemoglobin (Hb A1C), Microalbumin, urine, metFORMIN (GLUCOPHAGE) 500 MG tablet, sitaGLIPtin (JANUVIA) 100 MG tablet  Need for prophylactic vaccination and inoculation against viral hepatitis  Other seasonal allergic rhinitis - Plan: fluticasone (FLONASE) 50 MCG/ACT nasal spray  Unspecified hypothyroidism - Plan: levothyroxine (SYNTHROID, LEVOTHROID) 100 MCG tablet  1. Complete Physical Examination: anticipatory guidance provided --- weight loss, exercise. Pap smear obtained; refer for mammogram.  Colonoscopy UTD.  Immunizations UTD; s/p Hepatitis A#2. 2.  Gynecological exam; pap smear obtained; refer for mammogram. 3. DMII uncontrolled; consider addition of fourth oral agent.  Eye exam UTD; obtain urine microalbumin.   4.  HTN: controlled. Obtain labs. 5.  Hyperlipidemia: moderately controlled; obtain labs. 6. S/p Hepatitis A#2.  Meds ordered this encounter  Medications  . DISCONTD: glipiZIDE (GLUCOTROL XL) 10 MG 24 hr tablet    Sig: Take 1 tablet (10 mg total) by mouth  daily with breakfast.    Dispense:  90 tablet    Refill:  1  . DULoxetine (CYMBALTA) 60 MG capsule    Sig: Take 1 capsule (60 mg total) by mouth 2 (two) times daily.    Dispense:  60 capsule    Refill:  5  . fluticasone (FLONASE) 50 MCG/ACT nasal spray    Sig: Place 2 sprays into both nostrils daily.    Dispense:  16 g    Refill:  11  . gabapentin (NEURONTIN) 800 MG tablet    Sig: Take 1 tablet (800 mg total) by mouth 3 (three) times daily.    Dispense:  90 tablet    Refill:  11  . glipiZIDE (GLUCOTROL XL) 10 MG 24 hr tablet    Sig: Take 1 tablet (10 mg total) by mouth daily with breakfast.    Dispense:  30 tablet    Refill:  11  . levothyroxine (SYNTHROID, LEVOTHROID) 100 MCG tablet    Sig: Take 1 tablet (100 mcg total) by mouth daily.    Dispense:  30 tablet    Refill:  11  . metFORMIN (GLUCOPHAGE) 500 MG tablet    Sig: Take 1 tablet (500 mg total) by mouth every evening. With food.    Dispense:  30 tablet    Refill:  11  . ramipril (ALTACE) 5 MG capsule    Sig: Take 1 capsule (5 mg total) by mouth daily.    Dispense:  30 capsule    Refill:  11  . sitaGLIPtin (JANUVIA) 100 MG tablet  Sig: Take 1 tablet (100 mg total) by mouth daily. Take with food    Dispense:  30 tablet    Refill:  11    Return in about 3 months (around 08/29/2014) for recheck.   I personally performed the services described in this documentation, which was scribed in my presence.  The recorded information has been reviewed and is accurate.  Rachel Walls, M.D.  [facility] [address] [place],   [address] [phone] phone [phone] fax

## 2014-05-31 LAB — PAP IG AND HPV HIGH-RISK: HPV DNA HIGH RISK: NOT DETECTED

## 2014-06-06 MED ORDER — RAMIPRIL 5 MG PO CAPS: 5.0000 mg | ORAL_CAPSULE | Freq: Every day | ORAL | Status: DC

## 2014-06-06 MED ORDER — GLIPIZIDE ER 10 MG PO TB24
10.0000 mg | ORAL_TABLET | Freq: Every day | ORAL | Status: DC
Start: 2014-06-06 — End: 2015-07-03

## 2014-06-06 MED ORDER — DULOXETINE HCL 60 MG PO CPEP: 60.0000 mg | ORAL_CAPSULE | Freq: Two times a day (BID) | ORAL | Status: DC

## 2014-06-06 MED ORDER — FLUTICASONE PROPIONATE 50 MCG/ACT NA SUSP: 2.0000 | Freq: Every day | NASAL | Status: DC

## 2014-06-06 MED ORDER — GABAPENTIN 800 MG PO TABS: 800.0000 mg | ORAL_TABLET | Freq: Three times a day (TID) | ORAL | Status: DC

## 2014-06-06 MED ORDER — LEVOTHYROXINE SODIUM 100 MCG PO TABS: 100.0000 ug | ORAL_TABLET | Freq: Every day | ORAL | Status: DC

## 2014-06-06 MED ORDER — SITAGLIPTIN PHOSPHATE 100 MG PO TABS: 100.0000 mg | ORAL_TABLET | Freq: Every day | ORAL | Status: DC

## 2014-06-06 MED ORDER — METFORMIN HCL 500 MG PO TABS: 500.0000 mg | ORAL_TABLET | Freq: Every evening | ORAL | Status: DC

## 2014-07-04 ENCOUNTER — Ambulatory Visit: Payer: Self-pay | Admitting: Family Medicine

## 2014-07-04 LAB — HM MAMMOGRAPHY: HM Mammogram: NEGATIVE

## 2014-07-21 ENCOUNTER — Encounter: Payer: Self-pay | Admitting: Radiology

## 2014-08-18 ENCOUNTER — Telehealth: Payer: Self-pay

## 2014-08-18 NOTE — Telephone Encounter (Signed)
PA needed for lansoprazole. PA was completed and sent in on 08/16/14. Pending.

## 2014-08-28 ENCOUNTER — Encounter: Payer: Self-pay | Admitting: Family Medicine

## 2014-08-28 ENCOUNTER — Ambulatory Visit (INDEPENDENT_AMBULATORY_CARE_PROVIDER_SITE_OTHER): Payer: BC Managed Care – PPO | Admitting: Family Medicine

## 2014-08-28 VITALS — BP 120/88 | HR 111 | Temp 98.5°F | Resp 18 | Ht 64.5 in | Wt 278.6 lb

## 2014-08-28 DIAGNOSIS — J309 Allergic rhinitis, unspecified: Secondary | ICD-10-CM

## 2014-08-28 DIAGNOSIS — I10 Essential (primary) hypertension: Secondary | ICD-10-CM

## 2014-08-28 DIAGNOSIS — Z23 Encounter for immunization: Secondary | ICD-10-CM

## 2014-08-28 DIAGNOSIS — F419 Anxiety disorder, unspecified: Secondary | ICD-10-CM

## 2014-08-28 DIAGNOSIS — D649 Anemia, unspecified: Secondary | ICD-10-CM

## 2014-08-28 DIAGNOSIS — F418 Other specified anxiety disorders: Secondary | ICD-10-CM

## 2014-08-28 DIAGNOSIS — E1142 Type 2 diabetes mellitus with diabetic polyneuropathy: Secondary | ICD-10-CM

## 2014-08-28 DIAGNOSIS — E1165 Type 2 diabetes mellitus with hyperglycemia: Secondary | ICD-10-CM

## 2014-08-28 DIAGNOSIS — M3130 Wegener's granulomatosis without renal involvement: Secondary | ICD-10-CM

## 2014-08-28 DIAGNOSIS — IMO0002 Reserved for concepts with insufficient information to code with codable children: Secondary | ICD-10-CM

## 2014-08-28 DIAGNOSIS — E1342 Other specified diabetes mellitus with diabetic polyneuropathy: Secondary | ICD-10-CM

## 2014-08-28 DIAGNOSIS — F329 Major depressive disorder, single episode, unspecified: Secondary | ICD-10-CM

## 2014-08-28 DIAGNOSIS — G629 Polyneuropathy, unspecified: Secondary | ICD-10-CM

## 2014-08-28 LAB — GLUCOSE, POCT (MANUAL RESULT ENTRY): POC Glucose: 327 mg/dl — AB (ref 70–99)

## 2014-08-28 LAB — POCT GLYCOSYLATED HEMOGLOBIN (HGB A1C): HEMOGLOBIN A1C: 10.2

## 2014-08-28 MED ORDER — CANAGLIFLOZIN 100 MG PO TABS: 100.0000 mg | ORAL_TABLET | Freq: Every day | ORAL | Status: DC

## 2014-08-28 MED ORDER — CANAGLIFLOZIN 300 MG PO TABS: 300.0000 mg | ORAL_TABLET | Freq: Every day | ORAL | Status: DC

## 2014-08-28 MED ORDER — CANAGLIFLOZIN 100 MG PO TABS: 300.0000 mg | ORAL_TABLET | Freq: Every day | ORAL | Status: DC

## 2014-08-28 MED ORDER — RAMIPRIL 5 MG PO CAPS: 5.0000 mg | ORAL_CAPSULE | Freq: Every day | ORAL | Status: DC

## 2014-08-28 MED ORDER — LANSOPRAZOLE 30 MG PO CPDR: 30.0000 mg | DELAYED_RELEASE_CAPSULE | Freq: Every day | ORAL | Status: DC

## 2014-08-28 NOTE — Progress Notes (Signed)
Subjective:    Patient ID: Rachel Walls, female    DOB: 1965-12-25, 48 y.o.   MRN: [id number] This chart was scribed for [name], MD by Zola Button, Medical Scribe. This patient was seen in Room 21 and the patient's care was started at 4:45 PM.   08/28/2014  Follow-up and Anemia   HPI HPI Comments: Rachel Walls is a 48 y.o. female with a hx of DM and Wegener's granulomatosis who presents to [facility] for a follow-up for DM. 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 She got her flu shot earlier today. She notes that her blood sugars have been rather high in the morning, causing some patient concern. Today, her A1C was measured to be 10.2, and her blood sugar was measured to be 367. Patient would prefer to have a 4th DM pill added instead of insulin. She takes Metformin and Januvia at night after dinner, and she takes Glipizide in the morning before breakfast.   She has been taking Cymbalta, which she has been doing well on. Her feet have been doing well, except she believes she has some calluses on her feet. She has been taking her thyroid medicine, Claritin as needed, and Ramipril. Patient states that her husband has been doing well, although his job is somewhat stressful. She has some noted voice changes (more raspy) and reports having varicose veins in her right leg. She denies congestion and CP.  Review of Systems  Constitutional: Negative for fever, chills, diaphoresis and fatigue.  HENT: Positive for voice change. Negative for congestion, postnasal drip, rhinorrhea, sinus pressure, sore throat and trouble swallowing.   Eyes: Negative for visual disturbance.  Respiratory: Negative for cough and shortness of breath.   Cardiovascular: Negative for chest pain, palpitations and leg swelling.  Gastrointestinal: Negative for nausea, vomiting, abdominal pain, diarrhea and constipation.  Endocrine: Negative for cold intolerance,  heat intolerance, polydipsia, polyphagia and polyuria.  Skin: Negative for color change, pallor, rash and wound.  Neurological: Positive for numbness. Negative for dizziness, tremors, seizures, syncope, facial asymmetry, speech difficulty, weakness, light-headedness and headaches.  Psychiatric/Behavioral: Negative for suicidal ideas, sleep disturbance, self-injury and dysphoric mood. The patient is not nervous/anxious.     Past Medical History  Diagnosis Date  . Wegener's granulomatosis   . GERD (gastroesophageal reflux disease)   . Hypothyroidism   . Diabetes mellitus     Type 2  . Psychic factors associated with disease     Psychogenic factors with other diseases  . Hypertension   . Nonspecific elevation of levels of transaminase or lactic acid dehydrogenase (LDH)   . Other and unspecified hyperlipidemia   . Allergic rhinitis, cause unspecified   . Unspecified vitamin D deficiency   . Obesity, unspecified   . Hypertensive retinopathy   . Venous engorgement of retina   . Chronic kidney disease   . Anemia   . Anxiety   . Asthma    Past Surgical History  Procedure Laterality Date  . Calcaneous bone spur surgery      x2  . Tonsillectomy    . Renal biopsy  1999  . Lung biopsy  1999  . 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 Abdominal ultrasound  05/17/2012    severe hepatomegaly at 22 cm; gallbladder normal.   [facility].  . Esophagogastroduodenoscopy  08/19/12    diffuse gastritis antrum; pathology negative for malignancy, H. Pylori; esophagus  and duodenum normal.  . Ct abdomen  06/10/12    Hepatomegaly.  [facility].  Gery Pray scan  08/27/12    normal; EF 62%.  [facility].  . Meniscus tear      L; x 3 tears.  [facility].   Allergies  Allergen Reactions  . Penicillins Hives  . Sulfonamide Derivatives     Reaction: arthralgias.   Current Outpatient Prescriptions  Medication Sig Dispense Refill  . DULoxetine (CYMBALTA) 60 MG capsule Take 1 capsule (60 mg  total) by mouth 2 (two) times daily.  60 capsule  5  . fluticasone (FLONASE) 50 MCG/ACT nasal spray Place 2 sprays into both nostrils daily.  16 g  11  . gabapentin (NEURONTIN) 800 MG tablet Take 1 tablet (800 mg total) by mouth 3 (three) times daily.  90 tablet  11  . glipiZIDE (GLUCOTROL XL) 10 MG 24 hr tablet Take 1 tablet (10 mg total) by mouth daily with breakfast.  30 tablet  11  . ipratropium (ATROVENT) 0.03 % nasal spray Place 2 sprays into the nose 2 (two) times daily.  30 mL  5  . lansoprazole (PREVACID) 30 MG capsule Take 1 capsule (30 mg total) by mouth daily.  90 capsule  1  . levothyroxine (SYNTHROID, LEVOTHROID) 100 MCG tablet Take 1 tablet (100 mcg total) by mouth daily.  30 tablet  11  . loratadine (CLARITIN) 10 MG tablet Take 10 mg by mouth daily as needed.       . metFORMIN (GLUCOPHAGE) 500 MG tablet Take 1 tablet (500 mg total) by mouth every evening. With food.  30 tablet  11  . mycophenolate (CELLCEPT) 250 MG capsule Take 250 mg by mouth at bedtime. 2 tab in the morning & 1 tab at night.      . ramipril (ALTACE) 5 MG capsule Take 1 capsule (5 mg total) by mouth daily.  30 capsule  11  . sitaGLIPtin (JANUVIA) 100 MG tablet Take 1 tablet (100 mg total) by mouth daily. Take with food  30 tablet  11  . Canagliflozin 100 MG TABS Take 1 tablet (100 mg total) by mouth daily.  30 tablet  0  . Canagliflozin 300 MG TABS Take 1 tablet (300 mg total) by mouth daily.  30 tablet  11   No current facility-administered medications for this visit.       Objective:    BP 120/88  Pulse 111  Temp(Src) 98.5 F (36.9 C) (Oral)  Resp 18  Ht 5' 4.5" (1.638 m)  Wt 278 lb 9.6 oz (126.372 kg)  BMI 47.10 kg/m2  SpO2 97% Physical Exam  Nursing note and vitals reviewed. Constitutional: She is oriented to person, place, and time. She appears well-developed and well-nourished. No distress.  HENT:  Head: Normocephalic and atraumatic.  Right Ear: External ear normal.  Left Ear: External ear  normal.  Nose: Nose normal.  Mouth/Throat: Oropharynx is clear and moist. No oropharyngeal exudate.  Eyes: Conjunctivae and EOM are normal. Pupils are equal, round, and reactive to light.  Neck: Normal range of motion. Neck supple. Carotid bruit is not present. No thyromegaly present.  Cardiovascular: Normal rate, regular rhythm, normal heart sounds and intact distal pulses.  Exam reveals no gallop and no friction rub.   No murmur heard. Pulmonary/Chest: Effort normal and breath sounds normal. No respiratory distress. She has no wheezes. She has no rales.  Abdominal: Soft. Bowel sounds are normal. She exhibits no distension and no mass. There is  no tenderness. There is no rebound and no guarding.  Musculoskeletal: She exhibits no edema.  Lymphadenopathy:    She has no cervical adenopathy.  Neurological: She is alert and oriented to person, place, and time. No cranial nerve deficit.  Skin: Skin is warm and dry. No rash noted. She is not diaphoretic. No erythema. No pallor.  Callus formation B feet middle of horizontal arch.  Psychiatric: She has a normal mood and affect. Her behavior is normal.   Results for orders placed in visit on 08/28/14  CBC WITH DIFFERENTIAL      Result Value Ref Range   WBC 9.9  4.0 - 10.5 K/uL   RBC 5.14 (*) 3.87 - 5.11 MIL/uL   Hemoglobin 11.7 (*) 12.0 - 15.0 g/dL   HCT 36.9  36.0 - 46.0 %   MCV 71.8 (*) 78.0 - 100.0 fL   MCH 22.8 (*) 26.0 - 34.0 pg   MCHC 31.7  30.0 - 36.0 g/dL   RDW 15.4  11.5 - 15.5 %   Platelets 403 (*) 150 - 400 K/uL   Neutrophils Relative % 63  43 - 77 %   Neutro Abs 6.2  1.7 - 7.7 K/uL   Lymphocytes Relative 26  12 - 46 %   Lymphs Abs 2.6  0.7 - 4.0 K/uL   Monocytes Relative 9  3 - 12 %   Monocytes Absolute 0.9  0.1 - 1.0 K/uL   Eosinophils Relative 2  0 - 5 %   Eosinophils Absolute 0.2  0.0 - 0.7 K/uL   Basophils Relative 0  0 - 1 %   Basophils Absolute 0.0  0.0 - 0.1 K/uL   Smear Review Criteria for review not met    COMPLETE  METABOLIC PANEL WITH GFR      Result Value Ref Range   Sodium 134 (*) 135 - 145 mEq/L   Potassium 4.2  3.5 - 5.3 mEq/L   Chloride 101  96 - 112 mEq/L   CO2 26  19 - 32 mEq/L   Glucose, Bld 289 (*) 70 - 99 mg/dL   BUN 23  6 - 23 mg/dL   Creat 0.82  0.50 - 1.10 mg/dL   Total Bilirubin 0.3  0.2 - 1.2 mg/dL   Alkaline Phosphatase 81  39 - 117 U/L   AST 13  0 - 37 U/L   ALT 13  0 - 35 U/L   Total Protein 6.8  6.0 - 8.3 g/dL   Albumin 3.9  3.5 - 5.2 g/dL   Calcium 9.0  8.4 - 10.5 mg/dL   GFR, Est African American >89     GFR, Est Non African American 85    POCT GLYCOSYLATED HEMOGLOBIN (HGB A1C)      Result Value Ref Range   Hemoglobin A1C 10.2    GLUCOSE, POCT (MANUAL RESULT ENTRY)      Result Value Ref Range   POC Glucose 327 (*) 70 - 99 mg/dl       Assessment & Plan:   1. Type II diabetes mellitus, uncontrolled   2. Essential hypertension, benign   3. Wegener's granulomatosis   4. Diabetic peripheral neuropathy   5. Anxiety and depression   6. Allergic rhinitis, unspecified allergic rhinitis type   7. Need for prophylactic vaccination and inoculation against influenza   8. Anemia, unspecified anemia type     1. DMII: worsening and uncontrolled; add Invokana $RemoveBefo'100mg'ZIhLbfujwZG$  daily for one month then increase to $RemoveBef'300mg'XzEWnkwqGi$  daily.  Will likely  warrant addition of Lantus at next visit.  Obtain labs.  2.  HTN: controlled; obtain labs; continue current medications. 3.  Diabetic peripheral neuropathy: controlled with Neurontin and Cymbalta. 4.  Anxiety and depression: controlled with Cymbalta. 5.  Allergic Rhinitis: controlled. 6. Wegener's granulomatosis: stable; maintained on Cellcept. Warrants aggressive control of DMII. 7. S/p flu vaccine. 8. Anemia: mild but persistent; obtain labs.  Meds ordered this encounter  Medications  . lansoprazole (PREVACID) 30 MG capsule    Sig: Take 1 capsule (30 mg total) by mouth daily.    Dispense:  90 capsule    Refill:  1  . Canagliflozin 100 MG TABS      Sig: Take 1 tablet (100 mg total) by mouth daily.    Dispense:  30 tablet    Refill:  0  . ramipril (ALTACE) 5 MG capsule    Sig: Take 1 capsule (5 mg total) by mouth daily.    Dispense:  30 capsule    Refill:  11  . DISCONTD: Canagliflozin 100 MG TABS    Sig: Take 3 tablets (300 mg total) by mouth daily.    Dispense:  30 tablet    Refill:  11  . Canagliflozin 300 MG TABS    Sig: Take 1 tablet (300 mg total) by mouth daily.    Dispense:  30 tablet    Refill:  11    Return in about 2 months (around 10/28/2014) for recheck diabetes.   I personally performed the services described in this documentation, which was scribed in my presence.  The recorded information has been reviewed and is accurate.  [name], M.D.  [facility] [address] [place], [place]  [address] [phone] phone [phone] fax

## 2014-08-28 NOTE — Patient Instructions (Signed)
Canagliflozin oral tablets  What is this medicine?  CANAGLIFLOZIN (KAN a gli FLOE zin) helps to treat type 2 diabetes. It helps to control blood sugar. Treatment is combined with diet and exercise.  This medicine may be used for other purposes; ask your health care provider or pharmacist if you have questions.  COMMON BRAND NAME(S): Invokana  What should I tell my health care provider before I take this medicine?  They need to know if you have any of these conditions:  -dehydration  -diabetic ketoacidosis  -diet low in salt  -high cholesterol  -high levels of potassium in the blood  -history of yeast infection of the penis or vagina  -kidney disease  -liver disease  -low blood pressure  -on hemodialysis  -type 1 diabetes  -uncircumcised female  -an unusual or allergic reaction to canagliflozin, other medicines, foods, dyes, or preservatives  -pregnant or trying to get pregnant  -breast-feeding  How should I use this medicine?  Take this medicine by mouth with a glass of water. Follow the directions on the prescription label. Take it before the first meal of the day. Take your dose at the same time each day. Do not take more often than directed. Do not stop taking except on your doctor's advice.  A special MedGuide will be given to you by the pharmacist with each prescription and refill. Be sure to read this information carefully each time.  Talk to your pediatrician regarding the use of this medicine in children. Special care may be needed.  Overdosage: If you think you've taken too much of this medicine contact a poison control center or emergency room at once.  Overdosage: If you think you have taken too much of this medicine contact a poison control center or emergency room at once.  NOTE: This medicine is only for you. Do not share this medicine with others.  What if I miss a dose?  If you miss a dose, take it as soon as you can. If it is almost time for your next dose, take only that dose. Do not take double or  extra doses.  What may interact with this medicine?  Do not take this medicine with any of the following medications:  -gatifloxacinThis medicine may also interact with the following medications:  -alcohol  -certain medicines for blood pressure, heart disease  -digoxin  -diuretics  -insulin  -nateglinide  -phenobarbital  -phenytoin  -repaglinide  -rifampin  -ritonavir  -sulfonylureas like glimepiride, glipizide, glyburide  This list may not describe all possible interactions. Give your health care provider a list of all the medicines, herbs, non-prescription drugs, or dietary supplements you use. Also tell them if you smoke, drink alcohol, or use illegal drugs. Some items may interact with your medicine.  What should I watch for while using this medicine?  Visit your doctor or health care professional for regular checks on your progress.  A test called the HbA1C (A1C) will be monitored. This is a simple blood test. It measures your blood sugar control over the last 2 to 3 months. You will receive this test every 3 to 6 months.  Learn how to check your blood sugar. Learn the symptoms of low and high blood sugar and how to manage them.  Always carry a quick-source of sugar with you in case you have symptoms of low blood sugar. Examples include hard sugar candy or glucose tablets. Make sure others know that you can choke if you eat or drink when you develop   serious symptoms of low blood sugar, such as seizures or unconsciousness. They must get medical help at once.  Tell your doctor or health care professional if you have high blood sugar. You might need to change the dose of your medicine. If you are sick or exercising more than usual, you might need to change the dose of your medicine.  Do not skip meals. Ask your doctor or health care professional if you should avoid alcohol. Many nonprescription cough and cold products contain sugar or alcohol. These can affect blood sugar.  Wear a medical ID bracelet or chain, and  carry a card that describes your disease and details of your medicine and dosage times.  What side effects may I notice from receiving this medicine?  Side effects that you should report to your doctor or health care professional as soon as possible:  -allergic reactions like skin rash, itching or hives, swelling of the face, lips, or tongue  -breathing problems  -chest pain  -dizziness  -fast or irregular heartbeat  -feeling faint or lightheaded, falls  -fever, chills  -muscle weakness  -signs and symptoms of low blood sugar such as feeling anxious, confusion, dizziness, increased hunger, unusually weak or tired, sweating, shakiness, cold, irritable, headache, blurred vision, fast heartbeat, loss of consciousness  -trouble passing urine or change in the amount of urine  -penile discharge, itching, or pain in men  -vaginal discharge, itching, or odor in women  Side effects that usually do not require medical attention (Report these to your doctor or health care professional if they continue or are bothersome.):  -constipation  -increased urination  -nausea  -thirsty  This list may not describe all possible side effects. Call your doctor for medical advice about side effects. You may report side effects to FDA at 1-800-FDA-1088.  Where should I keep my medicine?  Keep out of the reach of children.  Store at room temperature between 20 and 25 degrees C (68 and 77 degrees F). Throw away any unused medicine after the expiration date.  NOTE: This sheet is a summary. It may not cover all possible information. If you have questions about this medicine, talk to your doctor, pharmacist, or health care provider.  © 2015, [employer]. (2013-02-23 14:08:06)

## 2014-08-29 LAB — CBC WITH DIFFERENTIAL/PLATELET
Basophils Absolute: 0 10*3/uL (ref 0.0–0.1)
Basophils Relative: 0 % (ref 0–1)
EOS ABS: 0.2 10*3/uL (ref 0.0–0.7)
Eosinophils Relative: 2 % (ref 0–5)
HCT: 36.9 % (ref 36.0–46.0)
Hemoglobin: 11.7 g/dL — ABNORMAL LOW (ref 12.0–15.0)
LYMPHS ABS: 2.6 10*3/uL (ref 0.7–4.0)
Lymphocytes Relative: 26 % (ref 12–46)
MCH: 22.8 pg — ABNORMAL LOW (ref 26.0–34.0)
MCHC: 31.7 g/dL (ref 30.0–36.0)
MCV: 71.8 fL — ABNORMAL LOW (ref 78.0–100.0)
MONO ABS: 0.9 10*3/uL (ref 0.1–1.0)
MONOS PCT: 9 % (ref 3–12)
NEUTROS PCT: 63 % (ref 43–77)
Neutro Abs: 6.2 10*3/uL (ref 1.7–7.7)
Platelets: 403 10*3/uL — ABNORMAL HIGH (ref 150–400)
RBC: 5.14 MIL/uL — AB (ref 3.87–5.11)
RDW: 15.4 % (ref 11.5–15.5)
WBC: 9.9 10*3/uL (ref 4.0–10.5)

## 2014-08-29 LAB — COMPLETE METABOLIC PANEL WITH GFR
ALBUMIN: 3.9 g/dL (ref 3.5–5.2)
ALT: 13 U/L (ref 0–35)
AST: 13 U/L (ref 0–37)
Alkaline Phosphatase: 81 U/L (ref 39–117)
BUN: 23 mg/dL (ref 6–23)
CO2: 26 mEq/L (ref 19–32)
Calcium: 9 mg/dL (ref 8.4–10.5)
Chloride: 101 mEq/L (ref 96–112)
Creat: 0.82 mg/dL (ref 0.50–1.10)
GFR, EST NON AFRICAN AMERICAN: 85 mL/min
GFR, Est African American: >89 mL/min
GLUCOSE: 289 mg/dL — AB (ref 70–99)
POTASSIUM: 4.2 meq/L (ref 3.5–5.3)
SODIUM: 134 meq/L — AB (ref 135–145)
TOTAL PROTEIN: 6.8 g/dL (ref 6.0–8.3)
Total Bilirubin: 0.3 mg/dL (ref 0.2–1.2)

## 2014-09-06 NOTE — Telephone Encounter (Signed)
Resubmitted PA to covermymeds.

## 2014-09-08 ENCOUNTER — Telehealth: Payer: Self-pay

## 2014-09-08 NOTE — Telephone Encounter (Signed)
Please call [facility] [place] for copy of recent eye exam.

## 2014-09-08 NOTE — Telephone Encounter (Signed)
We received a letter from [employer] insurance company asking us to contact pt and inquire about when her last eye exam was. If greater than one yr, can we refer her to optho? Left message on machine to call back on both numbers.

## 2014-09-08 NOTE — Telephone Encounter (Signed)
Pt did have a diabetic eye care screening at [facility] in June 2015

## 2014-09-12 NOTE — Telephone Encounter (Signed)
[facility] - [phone] 

## 2014-09-12 NOTE — Telephone Encounter (Signed)
Had to leave a message on the Medical Records VM

## 2014-09-19 NOTE — Telephone Encounter (Signed)
[facility] ([place] location) sent eye exam from April 2015. Given to Elease EtienneSara Hansen, LPN.

## 2014-09-19 NOTE — Telephone Encounter (Signed)
Sent a fax to [facility] ([place]) requesting most recent eye exam done in 2015. Not sure which location patient went to so I sent it to both with confirmation. Placed in pending pile in medical records.

## 2014-09-19 NOTE — Telephone Encounter (Signed)
Medical Records can you please help get these records to Dr. Katrinka BlazingSmith?  LM on Medical Records VM x2.

## 2014-10-13 NOTE — Telephone Encounter (Signed)
I had never heard back from ins after PA was resubmitted. Called to check w/pharm and they reported it is still needing a PA. I will call to check status and see if I can complete on the phone, but need to know, Dr Katrinka BlazingSmith, if pt has a Dx of GERD? I don't see it anywhere in the chart, only gastritis. I don't know if I can get it covered for this, but will try if she does not have a Dx of GERD.

## 2014-10-13 NOTE — Telephone Encounter (Signed)
Patient does have a history of gastritis detected by EGD.  This should cover the Lansoprazole.  Let me know if you have any difficulties with PA.

## 2014-10-16 NOTE — Telephone Encounter (Signed)
LMOM for pt to CB. Need to ask her h/o other meds tried before for gastritis (she has been on prevacid for several years). Form in [facility] pending folder, ready to send when med hx filled in.

## 2014-10-18 NOTE — Telephone Encounter (Signed)
Pt CB and stated that she tried Prilosec, and she thinks Nexium also, in the past. She has been taking prevacid for several years and it works best for her. Faxed completed form to ins. Pending.

## 2014-10-25 NOTE — Telephone Encounter (Signed)
Dr Katrinka BlazingSmith, PA was denied - gastritis is not one of the Dxs for which this med is covered. I have put denial letter in your box. Do you want to have pt pay OOP for OTC, try changing med (usually pantoprazole will be covered and pt had reported she hasn't tried it), or you could try to appeal decision.

## 2014-10-26 ENCOUNTER — Telehealth: Payer: Self-pay

## 2014-10-26 MED ORDER — PANTOPRAZOLE SODIUM 40 MG PO TBEC: 40.0000 mg | DELAYED_RELEASE_TABLET | Freq: Every day | ORAL | Status: DC

## 2014-10-26 NOTE — Telephone Encounter (Signed)
I have sent rx for Protonix to [facility]; let's see if insurance will cover it.

## 2014-10-27 NOTE — Telephone Encounter (Signed)
PA needed for pantoprazole. Completed on covermymeds. Pending. Dx of gastritis as Dxd by EGD done by GI on 08/19/12. Pt has tried omeprazole in the past, was ineffective. Prevacid worked well for pt but PA for it was denied.

## 2014-11-01 ENCOUNTER — Encounter: Payer: Self-pay | Admitting: Family Medicine

## 2014-11-01 ENCOUNTER — Ambulatory Visit (INDEPENDENT_AMBULATORY_CARE_PROVIDER_SITE_OTHER): Payer: BC Managed Care – PPO | Admitting: Family Medicine

## 2014-11-01 VITALS — BP 106/80 | HR 106 | Temp 98.3°F | Resp 16 | Ht 64.5 in | Wt 271.0 lb

## 2014-11-01 DIAGNOSIS — K5901 Slow transit constipation: Secondary | ICD-10-CM | POA: Diagnosis not present

## 2014-11-01 DIAGNOSIS — D649 Anemia, unspecified: Secondary | ICD-10-CM

## 2014-11-01 DIAGNOSIS — G629 Polyneuropathy, unspecified: Secondary | ICD-10-CM

## 2014-11-01 DIAGNOSIS — E1165 Type 2 diabetes mellitus with hyperglycemia: Secondary | ICD-10-CM | POA: Diagnosis not present

## 2014-11-01 DIAGNOSIS — E1342 Other specified diabetes mellitus with diabetic polyneuropathy: Secondary | ICD-10-CM | POA: Diagnosis not present

## 2014-11-01 DIAGNOSIS — Z23 Encounter for immunization: Secondary | ICD-10-CM

## 2014-11-01 DIAGNOSIS — K219 Gastro-esophageal reflux disease without esophagitis: Secondary | ICD-10-CM | POA: Diagnosis not present

## 2014-11-01 DIAGNOSIS — E1142 Type 2 diabetes mellitus with diabetic polyneuropathy: Secondary | ICD-10-CM

## 2014-11-01 DIAGNOSIS — J0110 Acute frontal sinusitis, unspecified: Secondary | ICD-10-CM

## 2014-11-01 DIAGNOSIS — N3941 Urge incontinence: Secondary | ICD-10-CM

## 2014-11-01 DIAGNOSIS — IMO0002 Reserved for concepts with insufficient information to code with codable children: Secondary | ICD-10-CM

## 2014-11-01 LAB — CBC WITH DIFFERENTIAL/PLATELET
Basophils Absolute: 0 10*3/uL (ref 0.0–0.1)
Basophils Relative: 0 % (ref 0–1)
EOS ABS: 0.2 10*3/uL (ref 0.0–0.7)
EOS PCT: 2 % (ref 0–5)
HCT: 34.2 % — ABNORMAL LOW (ref 36.0–46.0)
Hemoglobin: 10.8 g/dL — ABNORMAL LOW (ref 12.0–15.0)
Lymphocytes Relative: 19 % (ref 12–46)
Lymphs Abs: 2.2 10*3/uL (ref 0.7–4.0)
MCH: 21.5 pg — AB (ref 26.0–34.0)
MCHC: 31.6 g/dL (ref 30.0–36.0)
MCV: 68 fL — ABNORMAL LOW (ref 78.0–100.0)
MPV: 9 fL — AB (ref 9.4–12.4)
Monocytes Absolute: 0.9 10*3/uL (ref 0.1–1.0)
Monocytes Relative: 8 % (ref 3–12)
Neutro Abs: 8.1 10*3/uL — ABNORMAL HIGH (ref 1.7–7.7)
Neutrophils Relative %: 71 % (ref 43–77)
Platelets: 424 10*3/uL — ABNORMAL HIGH (ref 150–400)
RBC: 5.03 MIL/uL (ref 3.87–5.11)
RDW: 16.3 % — ABNORMAL HIGH (ref 11.5–15.5)
WBC: 11.4 10*3/uL — ABNORMAL HIGH (ref 4.0–10.5)

## 2014-11-01 LAB — POCT UA - MICROSCOPIC ONLY
BACTERIA, U MICROSCOPIC: NEGATIVE
Casts, Ur, LPF, POC: NEGATIVE
Crystals, Ur, HPF, POC: NEGATIVE
Mucus, UA: NEGATIVE
RBC, URINE, MICROSCOPIC: NEGATIVE
WBC, UR, HPF, POC: NEGATIVE
Yeast, UA: NEGATIVE

## 2014-11-01 LAB — POCT URINALYSIS DIPSTICK
BILIRUBIN UA: NEGATIVE
Glucose, UA: >=1000
Ketones, UA: NEGATIVE
LEUKOCYTES UA: NEGATIVE
NITRITE UA: NEGATIVE
Protein, UA: NEGATIVE
Spec Grav, UA: <=1.005
Urobilinogen, UA: 0.2
pH, UA: 5

## 2014-11-01 LAB — COMPLETE METABOLIC PANEL WITH GFR
ALT: 12 U/L (ref 0–35)
AST: 13 U/L (ref 0–37)
Albumin: 3.6 g/dL (ref 3.5–5.2)
Alkaline Phosphatase: 62 U/L (ref 39–117)
BILIRUBIN TOTAL: 0.3 mg/dL (ref 0.2–1.2)
BUN: 24 mg/dL — ABNORMAL HIGH (ref 6–23)
CO2: 23 meq/L (ref 19–32)
CREATININE: 1.04 mg/dL (ref 0.50–1.10)
Calcium: 8.8 mg/dL (ref 8.4–10.5)
Chloride: 100 mEq/L (ref 96–112)
GFR, EST AFRICAN AMERICAN: 73 mL/min
GFR, EST NON AFRICAN AMERICAN: 64 mL/min
Glucose, Bld: 163 mg/dL — ABNORMAL HIGH (ref 70–99)
Potassium: 4.3 mEq/L (ref 3.5–5.3)
Sodium: 135 mEq/L (ref 135–145)
Total Protein: 6.4 g/dL (ref 6.0–8.3)

## 2014-11-01 LAB — POCT GLYCOSYLATED HEMOGLOBIN (HGB A1C): Hemoglobin A1C: 8.7

## 2014-11-01 LAB — IRON: IRON: 23 ug/dL — AB (ref 42–145)

## 2014-11-01 MED ORDER — PANTOPRAZOLE SODIUM 40 MG PO TBEC: 40.0000 mg | DELAYED_RELEASE_TABLET | Freq: Every day | ORAL | Status: DC

## 2014-11-01 MED ORDER — AZITHROMYCIN 250 MG PO TABS: ORAL_TABLET | ORAL | Status: DC

## 2014-11-01 NOTE — Progress Notes (Signed)
Subjective:    Patient ID: Rachel Walls, female    DOB: 10/11/1966, 48 y.o.   MRN: [id number]  PCP: Nilda SimmerSMITH,KRISTI, MD  Chief Complaint  Patient presents with  . Follow-up    DIABETES   Prior to Admission medications   Medication Sig Start Date End Date Taking? Authorizing Provider  Canagliflozin 300 MG TABS Take 1 tablet (300 mg total) by mouth daily. 08/28/14  Yes Ethelda ChickKristi M Smith, MD  DULoxetine (CYMBALTA) 60 MG capsule Take 1 capsule (60 mg total) by mouth 2 (two) times daily. 06/06/14  Yes Ethelda ChickKristi M Smith, MD  fluticasone (FLONASE) 50 MCG/ACT nasal spray Place 2 sprays into both nostrils daily. 06/06/14  Yes Ethelda ChickKristi M Smith, MD  gabapentin (NEURONTIN) 800 MG tablet Take 1 tablet (800 mg total) by mouth 3 (three) times daily. 06/06/14  Yes Ethelda ChickKristi M Smith, MD  glipiZIDE (GLUCOTROL XL) 10 MG 24 hr tablet Take 1 tablet (10 mg total) by mouth daily with breakfast. 06/06/14  Yes Ethelda ChickKristi M Smith, MD  ipratropium (ATROVENT) 0.03 % nasal spray Place 2 sprays into the nose 2 (two) times daily. 01/11/13  Yes Ethelda ChickKristi M Smith, MD  lansoprazole (PREVACID) 30 MG capsule Take 1 capsule (30 mg total) by mouth daily. 08/28/14  Yes Ethelda ChickKristi M Smith, MD  levothyroxine (SYNTHROID, LEVOTHROID) 100 MCG tablet Take 1 tablet (100 mcg total) by mouth daily. 06/06/14  Yes Ethelda ChickKristi M Smith, MD  loratadine (CLARITIN) 10 MG tablet Take 10 mg by mouth daily as needed.    Yes Historical Provider, MD  metFORMIN (GLUCOPHAGE) 500 MG tablet Take 1 tablet (500 mg total) by mouth every evening. With food. 06/06/14  Yes Ethelda ChickKristi M Smith, MD  mycophenolate (CELLCEPT) 250 MG capsule Take 250 mg by mouth at bedtime. 2 tab in the morning & 1 tab at night.   Yes Historical Provider, MD  ramipril (ALTACE) 5 MG capsule Take 1 capsule (5 mg total) by mouth daily. 08/28/14  Yes Ethelda ChickKristi M Smith, MD  sitaGLIPtin (JANUVIA) 100 MG tablet Take 1 tablet (100 mg total) by mouth daily. Take with food 06/06/14  Yes Ethelda ChickKristi M Smith, MD  Canagliflozin 100 MG TABS  Take 1 tablet (100 mg total) by mouth daily. Patient not taking: Reported on 11/01/2014 08/28/14   Ethelda ChickKristi M Smith, MD  pantoprazole (PROTONIX) 40 MG tablet Take 1 tablet (40 mg total) by mouth daily. Patient not taking: Reported on 11/01/2014 10/26/14   Ethelda ChickKristi M Smith, MD   Medications, allergies, past medical history, surgical history, family history, social history and problem list reviewed and updated.  HPI  48 of with extensive PMH including DMII, diabetic neuropathy, htn, wegeners, and anemia returns for DM follow up.  She was seen here a little over 2 months ago. A1C at that time was 10.2. She had already been on metformin 500 (higher doses led to GI upset), januvia 100, and glipizide 10. She was started on invokana increasing to 300mg  at that visit as she wished to avoid insulin.   Checking her BG at home daily. Fasting in am. Has been running 160s, which is improved from 200-300 range her fasting BG had been last visit. Denies polyuria, polydipsia, CP, SOB.   Today she states she has been watching her diet.  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   BP well controlled today. Taking ramipril 5mg  qd mainly for renal protection.   She was slightly anemic last visit 2 mon ago. She was started on 325 ferrous sulfate 2 yrs ago but had to stop as other medical issues came on. She has not restarted the iron. She had a normal colonoscopy 3 yrs ago. Was done as she was having abd pain and rectal bleeding at that time which was found to be due to hemorrhoids.   HR elevated today at 106bpm. Last visit was 111 bpm. Denies fever, chills, pain.   Review of Systems No dysuria, no hematuria. See HPI.     Objective:   Physical Exam  Constitutional:  She is oriented to person, place, and time. She appears well-developed and well-nourished.  Non-toxic appearance. She does not have a sickly appearance. She does not appear ill. No distress.  BP 106/80 mmHg  Pulse 106  Temp(Src) 98.3 F (36.8 C) (Oral)  Resp 16  Ht 5' 4.5" (1.638 m)  Wt 271 lb (122.925 kg)  BMI 45.82 kg/m2  SpO2 97%  LMP 10/02/2014   Cardiovascular: Normal rate, regular rhythm, S1 normal, S2 normal and normal heart sounds.  Exam reveals no gallop.   No murmur heard. Pulmonary/Chest: Effort normal and breath sounds normal. She has no decreased breath sounds. She has no wheezes. She has no rhonchi. She has no rales.  Neurological: She is alert and oriented to person, place, and time.  Psychiatric: She has a normal mood and affect. Her speech is normal.        Assessment & Plan:   48 of with extensive PMH including DMII, diabetic neuropathy, htn, wegeners, and anemia returns for DM follow up.  Type II diabetes mellitus, uncontrolled - Plan: POCT glycosylated hemoglobin (Hb A1C), CBC with Differential, COMPLETE METABOLIC PANEL WITH GFR, HM Diabetes Foot Exam Diabetic peripheral neuropathy - Plan: COMPLETE METABOLIC PANEL WITH GFR Anemia, unspecified anemia type - Plan: Iron, Vitamin B12, Ferritin, CANCELED: IBC panel Urge incontinence of urine - Plan: POCT urinalysis dipstick, POCT UA - Microscopic Only, Urine culture Acute frontal sinusitis, recurrence not specified  Workup and plan per Dr. Katrinka BlazingSmith.   Donnajean Lopesodd M. Joni Colegrove, PA-C Physician Assistant-Certified [facility]  [facility]  11/01/2014 7:27 PM

## 2014-11-01 NOTE — Progress Notes (Addendum)
Subjective:    Patient ID: Rachel Walls, female    DOB: 10/28/1966, 48 y.o.   MRN: [id number]  11/01/2014  Follow-up and Hypertension   HPI This 48 y.o. female presents for two month follow-up:  1.  DMII:  Management started last visit included adding Invokana.  Not having side effects to medication.  Urination frequency varies greatly.  Nocturia x 1-2.  Neuropathy well controlled with Gabapentin 800mg  qhs.    2. Urinary leakage: not just with coughing or sneezing;  No dysuria, hematuria, urgency.  Wearing a pad.  Onset with starting Invokana.    3.  Sinus congestion: having a headache; +dizziness; ears congested.  No fever.  +congestion.  Green drainage to brown drainage to yellow tan drainage.  Onset two weeks ago.  +hoarseness.  ST initially.  No ear pain.  Using Flonase which was helping but symptoms now persistent.  Not taking Claritin.  Taking Atrovent nasal spray once.  4.  GERD: taking OTC Prevacid with good results.  Never received Protonix rx.  Denies n/v/d. Denies bloody stools or melena.   5.  Anxiety and depression: mood stable on Duloxetine.  Denies excessive depressive or anxiety symptoms.  Work and home are good.  6.  Anemia:  Not taking iron currently.    7. Abdominal pain: intermittent; food has no effect.  No n/v/d.  +constipation chronically.  Dulcolax once per week.     Review of Systems  Constitutional: Negative for fever, chills, diaphoresis and fatigue.  HENT: Positive for congestion, postnasal drip, rhinorrhea and voice change. Negative for ear pain, sinus pressure, sore throat and trouble swallowing.   Eyes: Negative for visual disturbance.  Respiratory: Negative for cough and shortness of breath.   Cardiovascular: Negative for chest pain, palpitations and leg swelling.  Gastrointestinal: Positive for abdominal pain and constipation. Negative for nausea, vomiting, diarrhea, blood in stool, abdominal distention and anal bleeding.  Endocrine: Negative for  cold intolerance, heat intolerance, polydipsia, polyphagia and polyuria.  Genitourinary: Positive for urgency and frequency. Negative for dysuria, hematuria, vaginal bleeding, vaginal discharge, enuresis and vaginal pain.  Neurological: Positive for numbness. Negative for dizziness, tremors, seizures, syncope, facial asymmetry, speech difficulty, weakness, light-headedness and headaches.  Psychiatric/Behavioral: Negative for suicidal ideas, sleep disturbance, self-injury and dysphoric mood. The patient is not nervous/anxious.     Past Medical History  Diagnosis Date  . Wegener's granulomatosis   . GERD (gastroesophageal reflux disease)   . Hypothyroidism   . Diabetes mellitus     Type 2  . Psychic factors associated with disease     Psychogenic factors with other diseases  . Hypertension   . Nonspecific elevation of levels of transaminase or lactic acid dehydrogenase (LDH)   . Other and unspecified hyperlipidemia   . Allergic rhinitis, cause unspecified   . Unspecified vitamin D deficiency   . Obesity, unspecified   . Hypertensive retinopathy   . Venous engorgement of retina   . Chronic kidney disease   . Anemia   . Anxiety   . Asthma    Past Surgical History  Procedure Laterality Date  . Calcaneous bone spur surgery      x2  . Tonsillectomy    . Renal biopsy  1999  . Lung biopsy  1999  . 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 Abdominal ultrasound  05/17/2012    severe hepatomegaly at 22 cm; gallbladder normal.   [facility].  . Esophagogastroduodenoscopy  08/19/12  diffuse gastritis antrum; pathology negative for malignancy, H. Pylori; esophagus and duodenum normal.  . Ct abdomen  06/10/12    Hepatomegaly.  [facility].  Gery Pray. Hida scan  08/27/12    normal; EF 62%.  [facility].  . Meniscus tear      L; x 3 tears.  [facility]   Allergies  Allergen Reactions  . Penicillins Hives  . Sulfonamide Derivatives     Reaction: arthralgias.   Current Outpatient  Prescriptions  Medication Sig Dispense Refill  . Canagliflozin 300 MG TABS Take 1 tablet (300 mg total) by mouth daily. 30 tablet 11  . DULoxetine (CYMBALTA) 60 MG capsule Take 1 capsule (60 mg total) by mouth 2 (two) times daily. 60 capsule 5  . fluticasone (FLONASE) 50 MCG/ACT nasal spray Place 2 sprays into both nostrils daily. 16 g 11  . gabapentin (NEURONTIN) 800 MG tablet Take 1 tablet (800 mg total) by mouth 3 (three) times daily. 90 tablet 11  . glipiZIDE (GLUCOTROL XL) 10 MG 24 hr tablet Take 1 tablet (10 mg total) by mouth daily with breakfast. 30 tablet 11  . lansoprazole (PREVACID) 30 MG capsule Take 1 capsule (30 mg total) by mouth daily. 90 capsule 1  . levothyroxine (SYNTHROID, LEVOTHROID) 100 MCG tablet Take 1 tablet (100 mcg total) by mouth daily. 30 tablet 11  . loratadine (CLARITIN) 10 MG tablet Take 10 mg by mouth daily as needed.     . metFORMIN (GLUCOPHAGE) 500 MG tablet Take 1 tablet (500 mg total) by mouth every evening. With food. 30 tablet 11  . mycophenolate (CELLCEPT) 250 MG capsule Take 250 mg by mouth at bedtime. 2 tab in the morning & 1 tab at night.    . ramipril (ALTACE) 5 MG capsule Take 1 capsule (5 mg total) by mouth daily. 30 capsule 11  . sitaGLIPtin (JANUVIA) 100 MG tablet Take 1 tablet (100 mg total) by mouth daily. Take with food 30 tablet 11  . albuterol (PROVENTIL HFA;VENTOLIN HFA) 108 (90 BASE) MCG/ACT inhaler Inhale 90 mcg into the lungs every 4 (four) hours as needed.    . Cholecalciferol (VITAMIN D3) 1000 UNITS CAPS Take 1,000 mg by mouth daily.    . pantoprazole (PROTONIX) 40 MG tablet Take 1 tablet (40 mg total) by mouth daily. 90 tablet 3  . Vitamin D, Ergocalciferol, (DRISDOL) 50000 UNITS CAPS capsule Take 1 capsule by mouth once a week.     No current facility-administered medications for this visit.       Objective:    BP 106/80 mmHg  Pulse 106  Temp(Src) 98.3 F (36.8 C) (Oral)  Resp 16  Ht 5' 4.5" (1.638 m)  Wt 271 lb (122.925 kg)   BMI 45.82 kg/m2  SpO2 97%  LMP 10/02/2014 Physical Exam  Constitutional: She is oriented to person, place, and time. She appears well-developed and well-nourished. No distress.  obese  HENT:  Head: Normocephalic and atraumatic.  Right Ear: External ear normal.  Left Ear: External ear normal.  Nose: Nose normal.  Mouth/Throat: Oropharynx is clear and moist.  Eyes: Conjunctivae and EOM are normal. Pupils are equal, round, and reactive to light.  Neck: Normal range of motion. Neck supple. Carotid bruit is not present. No thyromegaly present.  Cardiovascular: Normal rate, regular rhythm, normal heart sounds and intact distal pulses.  Exam reveals no gallop and no friction rub.   No murmur heard. Pulmonary/Chest: Effort normal and breath sounds normal. She has no wheezes. She has no rales.  Abdominal: Soft. Bowel sounds are normal. She exhibits no distension and no mass. There is no tenderness. There is no rebound and no guarding.  Lymphadenopathy:    She has no cervical adenopathy.  Neurological: She is alert and oriented to person, place, and time. No cranial nerve deficit.  Skin: Skin is warm and dry. No rash noted. She is not diaphoretic. No erythema. No pallor.  Psychiatric: She has a normal mood and affect. Her behavior is normal.        Assessment & Plan:   1. Type II diabetes mellitus, uncontrolled   2. Diabetic peripheral neuropathy   3. Anemia, unspecified anemia type   4. Urge incontinence of urine   5. Acute frontal sinusitis, recurrence not specified   6. Slow transit constipation      1. DMII: uncontrolled; tolerating Invokana well other than urinary symptoms; obtain labs.   2.  Diabetic peripheral neuropathy: controlled with current medications of Cymbalta and Neurontin. 3.  Anemia: stable; obtain labs; not currently taking any iron. 4.  Urge incontinence: new since starting Invokana; obtain urine culture.  If urine culture negative, considering HOLDING  Invokana. 5.  Acute frontal sinusitis: New. Rx for Zpack provided. Continue nasal sprays. 6.  Constipation: New. Recommend daily stool softener such as Miralax or Colace; recommend increased water intake and fiber intake. 7. GERD: stable; rx for Protonix provided.   Meds ordered this encounter  Medications  . pantoprazole (PROTONIX) 40 MG tablet    Sig: Take 1 tablet (40 mg total) by mouth daily.    Dispense:  90 tablet    Refill:  3  . DISCONTD: azithromycin (ZITHROMAX) 250 MG tablet    Sig: Two tablets daily x 1 day then one tablet daily x 4 days    Dispense:  6 tablet    Refill:  0    Return in about 3 months (around 01/31/2015) for recheck.    Nilda SimmerKristi Rochele Lueck, M.D.  [facility]  [place] [address] [place], [place]  [id number] [phone] phone [phone] fax

## 2014-11-02 LAB — VITAMIN B12: VITAMIN B 12: 333 pg/mL (ref 211–911)

## 2014-11-02 LAB — FERRITIN: FERRITIN: 5 ng/mL — AB (ref 10–291)

## 2014-11-03 LAB — URINE CULTURE

## 2014-11-03 NOTE — Telephone Encounter (Signed)
PA was denied for pantoprazole. Ins only covers PPIs more than their allowed #90 w/in a 180 day period is not covered for chronic gastritis. Only cover QD for GERD, peptic ulcer, Barrett's esophagus or hypersecretory conditions, or prevention of NSAID or steroid related ulcer. I have put the letter of denial in Dr Michaelle CopasSmith's box. If pt doesn't meet any of above Dxs, she will probably have to either take QOD or pay OOP for the uncovered portion of the Rx (half).

## 2014-11-09 ENCOUNTER — Encounter: Payer: Self-pay | Admitting: Radiology

## 2014-11-10 ENCOUNTER — Telehealth: Payer: Self-pay

## 2014-11-10 NOTE — Telephone Encounter (Signed)
Pt called back about labs. Notified.  

## 2014-11-20 NOTE — Telephone Encounter (Signed)
Please call and advise pt of denial for Pantoprazole. Also clarify with her if she suffers with GERD.

## 2014-11-20 NOTE — Telephone Encounter (Signed)
LMOM to CB. 

## 2014-11-22 NOTE — Telephone Encounter (Signed)
LMOM to CB. 

## 2014-11-24 NOTE — Telephone Encounter (Signed)
Spoke with pt and she reported that she has been Dxd with GERD and she does have Sxs if she doesn't take it every day. She stated that the prevacid works the best for her. Called [employer] and was told that we can not reopen case to do another PA, but can send a letter of appeal to [address] [address], [facility] [address]. Wrote appeal for coverage of lansoprazole and mailed to address given.

## 2014-12-19 ENCOUNTER — Emergency Department: Payer: Self-pay | Admitting: Internal Medicine

## 2014-12-19 LAB — TROPONIN I: Troponin-I: <0.02 ng/mL

## 2014-12-19 LAB — COMPREHENSIVE METABOLIC PANEL WITH GFR
Albumin: 3.3 g/dL — ABNORMAL LOW
Alkaline Phosphatase: 75 U/L
Anion Gap: 10
BUN: 20 mg/dL — ABNORMAL HIGH
Bilirubin,Total: 0.4 mg/dL
Calcium, Total: 8.4 mg/dL — ABNORMAL LOW
Chloride: 102 mmol/L
Co2: 25 mmol/L
Creatinine: 1.12 mg/dL
EGFR (African American): >60
EGFR (Non-African Amer.): 55 — ABNORMAL LOW
Glucose: 124 mg/dL — ABNORMAL HIGH
Osmolality: 278
Potassium: 4 mmol/L
SGOT(AST): 22 U/L
SGPT (ALT): 20 U/L
Sodium: 137 mmol/L
Total Protein: 7.3 g/dL

## 2014-12-19 LAB — DIFFERENTIAL
BASOS PCT: 0.6 %
Basophil #: 0.1 10*3/uL (ref 0.0–0.1)
EOS ABS: 0.2 10*3/uL (ref 0.0–0.7)
Eosinophil %: 2.3 %
LYMPHS ABS: 1 10*3/uL (ref 1.0–3.6)
LYMPHS PCT: 10.6 %
Monocyte #: 1.2 x10 3/mm — ABNORMAL HIGH (ref 0.2–0.9)
Monocyte %: 13 %
NEUTROS ABS: 6.9 10*3/uL — AB (ref 1.4–6.5)
Neutrophil %: 73.5 %

## 2014-12-19 LAB — D-DIMER(ARMC): D-Dimer: 761 ng/ml

## 2014-12-19 LAB — CBC
HCT: 38.7 %
HGB: 11.9 g/dL — ABNORMAL LOW
MCH: 21.6 pg — ABNORMAL LOW
MCHC: 30.9 g/dL — ABNORMAL LOW
MCV: 70 fL — ABNORMAL LOW
Platelet: 339 10*3/uL
RBC: 5.53 X10 6/mm 3 — ABNORMAL HIGH
RDW: 17.4 % — ABNORMAL HIGH
WBC: 9.4 10*3/uL

## 2014-12-24 LAB — CULTURE, BLOOD (SINGLE)

## 2014-12-25 ENCOUNTER — Telehealth: Payer: Self-pay

## 2014-12-25 NOTE — Telephone Encounter (Signed)
Pt called and wanted to let Dr. Katrinka BlazingSmith  know [facility].( She does need to see Dr Katrinka BlazingSmith for a hospital follow up appt, but there are available appointments, I have advised her of Dr Michaelle CopasSmith's walkin scheduled. She states she will come to the walkin center on Thursday.)

## 2014-12-26 NOTE — Telephone Encounter (Signed)
FYI Dr. Smith

## 2014-12-26 NOTE — Telephone Encounter (Signed)
[facility] records reviewed; will plan to see pt on Thursday.

## 2014-12-28 ENCOUNTER — Ambulatory Visit (INDEPENDENT_AMBULATORY_CARE_PROVIDER_SITE_OTHER): Payer: BC Managed Care – PPO | Admitting: Family Medicine

## 2014-12-28 VITALS — BP 122/82 | HR 117 | Temp 99.0°F | Resp 18 | Ht 65.0 in | Wt 268.0 lb

## 2014-12-28 DIAGNOSIS — M3130 Wegener's granulomatosis without renal involvement: Secondary | ICD-10-CM

## 2014-12-28 DIAGNOSIS — J4531 Mild persistent asthma with (acute) exacerbation: Secondary | ICD-10-CM

## 2014-12-28 DIAGNOSIS — J9801 Acute bronchospasm: Secondary | ICD-10-CM

## 2014-12-28 DIAGNOSIS — J22 Unspecified acute lower respiratory infection: Secondary | ICD-10-CM

## 2014-12-28 DIAGNOSIS — J988 Other specified respiratory disorders: Secondary | ICD-10-CM

## 2014-12-28 DIAGNOSIS — E1142 Type 2 diabetes mellitus with diabetic polyneuropathy: Secondary | ICD-10-CM

## 2014-12-28 LAB — POCT CBC
GRANULOCYTE PERCENT: 74.5 % (ref 37–80)
HEMATOCRIT: 40.8 % (ref 37.7–47.9)
Hemoglobin: 12.7 g/dL (ref 12.2–16.2)
Lymph, poc: 3 (ref 0.6–3.4)
MCH: 21.9 pg — AB (ref 27–31.2)
MCHC: 31.1 g/dL — AB (ref 31.8–35.4)
MCV: 70.3 fL — AB (ref 80–97)
MID (cbc): 0.9 (ref 0–0.9)
MPV: 7.3 fL (ref 0–99.8)
PLATELET COUNT, POC: 478 10*3/uL — AB (ref 142–424)
POC Granulocyte: 11.5 — AB (ref 2–6.9)
POC LYMPH PERCENT: 19.4 %L (ref 10–50)
POC MID %: 6.1 %M (ref 0–12)
RBC: 5.8 M/uL — AB (ref 4.04–5.48)
RDW, POC: 17.7 %
WBC: 15.4 10*3/uL — AB (ref 4.6–10.2)

## 2014-12-28 LAB — GLUCOSE, POCT (MANUAL RESULT ENTRY): POC Glucose: 223 mg/dl — AB (ref 70–99)

## 2014-12-28 MED ORDER — LEVOFLOXACIN 750 MG PO TABS
750.0000 mg | ORAL_TABLET | Freq: Every day | ORAL | Status: DC
Start: 2014-12-28 — End: 2015-01-16

## 2014-12-28 MED ORDER — PREDNISONE 20 MG PO TABS: ORAL_TABLET | ORAL | Status: DC

## 2014-12-28 NOTE — Patient Instructions (Signed)
1. Start Flonase daily. 2. Start nasal saline solution daily. 3.  Start Levofloxacin daily for eleven days. 4. Continue Albuterol every 4-6 hours. 5. Start Prednisone taper. 6. Check sugars daily.

## 2014-12-28 NOTE — Progress Notes (Signed)
Subjective:    Patient ID: Rachel Walls, female    DOB: December 08, 1965, 49 y.o.   MRN: [id number]  12/28/2014  Shortness of Breath and Cough   HPI This 49 y.o. female presents for TRANSITION INTO CARE:  1.  Community acquired pneumonia: presented to Urgent Care in [place] who performed CXR who was concerned about ?blood clot.  Sent to [facility]; s/p CT scan negative for PE.   ED prescribed abx (not sure of name/Levofloxacin).  Then went home and worsened from cough, SOB.  Called Dr. Dooley/rheumatologist who saw patient on Thursday; Terie Purser has no hospital privileges; Terie Purser heard crackling; tried to get into pulmonary clinic but could not be seen that day.  Dooley sent to [facility] ED; evaluated by pulmonologist who spoke with Terie Purser and decided to admit pt.  Administered three iv abx initially; stopped abx due to viral infection.  Finally on Saturday metopneumovirus. Discharged on inhaler.  Dr. Terie Purser had given Tussionex which is helpful for sleep at night; sleeping 12 hours per day.  Out of school all week and three days last week. Dr. Terie Purser sent pt to PCP; also Terie Purser wants PCP to find pulmonologist.  CT scan has not changed any since last CT in 2009.  No flare of Wegner's.  Started with a simple sinus infection.    Discharge WBC of 7.9.  Now, very tired, excessive coughing, still quite congested.  No fever since discharge; during admission 101.3.  Today 99; intermittent chills and sweats.  No headache now.  L ear is ringing; normal hearing.  +nasal congestion moderate clear-thick and thick yellow.  No sinus pressure.  Rare PND.  +sputum brownish.  Wheezing a lot; using Albuterol every four hours.  No n/v/d.  Husband is not sick.      Review of Systems  Constitutional: Positive for chills and fatigue. Negative for fever and diaphoresis.  HENT: Positive for congestion and tinnitus. Negative for ear pain, rhinorrhea, sinus pressure and voice change.   Respiratory: Positive for cough, shortness of breath  and wheezing.   Gastrointestinal: Negative for nausea, vomiting and diarrhea.  Endocrine: Negative for cold intolerance, heat intolerance, polydipsia, polyphagia and polyuria.  Skin: Negative for rash.  Neurological: Negative for dizziness and headaches.    Past Medical History  Diagnosis Date  . Wegener's granulomatosis   . GERD (gastroesophageal reflux disease)   . Hypothyroidism   . Diabetes mellitus     Type 2  . Psychic factors associated with disease     Psychogenic factors with other diseases  . Hypertension   . Nonspecific elevation of levels of transaminase or lactic acid dehydrogenase (LDH)   . Other and unspecified hyperlipidemia   . Allergic rhinitis, cause unspecified   . Unspecified vitamin D deficiency   . Obesity, unspecified   . Hypertensive retinopathy   . Venous engorgement of retina   . Chronic kidney disease   . Anemia   . Anxiety   . Asthma    Past Surgical History  Procedure Laterality Date  . Calcaneous bone spur surgery      x2  . Tonsillectomy    . Renal biopsy  1999  . Lung biopsy  1999  . 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 Abdominal ultrasound  05/17/2012    severe hepatomegaly at 22 cm; gallbladder normal.   [facility].  . Esophagogastroduodenoscopy  08/19/12    diffuse gastritis antrum; pathology negative for malignancy, H. Pylori; esophagus and duodenum normal.  .  Ct abdomen  06/10/12    Hepatomegaly.  [facility].  Gery Pray. [facility] scan  08/27/12    normal; EF 62%.  [facility].  . Meniscus tear      L; x 3 tears.  [facility].   Allergies  Allergen Reactions  . Penicillins Hives  . Sulfonamide Derivatives     Reaction: arthralgias.   Current Outpatient Prescriptions  Medication Sig Dispense Refill  . Canagliflozin 100 MG TABS Take 1 tablet (100 mg total) by mouth daily. 30 tablet 0  . Canagliflozin 300 MG TABS Take 1 tablet (300 mg total) by mouth daily. 30 tablet 11  . DULoxetine (CYMBALTA) 60 MG capsule Take 1 capsule  (60 mg total) by mouth 2 (two) times daily. 60 capsule 5  . fluticasone (FLONASE) 50 MCG/ACT nasal spray Place 2 sprays into both nostrils daily. 16 g 11  . gabapentin (NEURONTIN) 800 MG tablet Take 1 tablet (800 mg total) by mouth 3 (three) times daily. 90 tablet 11  . glipiZIDE (GLUCOTROL XL) 10 MG 24 hr tablet Take 1 tablet (10 mg total) by mouth daily with breakfast. 30 tablet 11  . lansoprazole (PREVACID) 30 MG capsule Take 1 capsule (30 mg total) by mouth daily. 90 capsule 1  . levothyroxine (SYNTHROID, LEVOTHROID) 100 MCG tablet Take 1 tablet (100 mcg total) by mouth daily. 30 tablet 11  . loratadine (CLARITIN) 10 MG tablet Take 10 mg by mouth daily as needed.     . metFORMIN (GLUCOPHAGE) 500 MG tablet Take 1 tablet (500 mg total) by mouth every evening. With food. 30 tablet 11  . mycophenolate (CELLCEPT) 250 MG capsule Take 250 mg by mouth at bedtime. 2 tab in the morning & 1 tab at night.    . ramipril (ALTACE) 5 MG capsule Take 1 capsule (5 mg total) by mouth daily. 30 capsule 11  . sitaGLIPtin (JANUVIA) 100 MG tablet Take 1 tablet (100 mg total) by mouth daily. Take with food 30 tablet 11  . ipratropium (ATROVENT) 0.03 % nasal spray Place 2 sprays into the nose 2 (two) times daily. (Patient not taking: Reported on 12/28/2014) 30 mL 5  . levofloxacin (LEVAQUIN) 750 MG tablet Take 1 tablet (750 mg total) by mouth daily. 7 tablet 0  . pantoprazole (PROTONIX) 40 MG tablet Take 1 tablet (40 mg total) by mouth daily. (Patient not taking: Reported on 12/28/2014) 90 tablet 3  . predniSONE (DELTASONE) 20 MG tablet Two tablets daily x 5 days then one tablet daily x 5 days 15 tablet 0   No current facility-administered medications for this visit.       Objective:    BP 122/82 mmHg  Pulse 117  Temp(Src) 99 F (37.2 C) (Oral)  Resp 18  Ht 5\' 5"  (1.651 m)  Wt 268 lb (121.564 kg)  BMI 44.60 kg/m2  SpO2 95%  PF 230 L/min  LMP 11/17/2014 Physical Exam  Constitutional: She is oriented to  person, place, and time. She appears well-developed and well-nourished. No distress.  obese  HENT:  Head: Normocephalic and atraumatic.  Right Ear: External ear normal.  Left Ear: External ear normal.  Nose: Nose normal.  Mouth/Throat: Oropharynx is clear and moist.  Eyes: Conjunctivae and EOM are normal. Pupils are equal, round, and reactive to light.  Neck: Normal range of motion. Neck supple. Carotid bruit is not present. No thyromegaly present.  Cardiovascular: Normal rate, regular rhythm, normal heart sounds and intact distal pulses.  Exam reveals no gallop and no friction  rub.   No murmur heard. Pulmonary/Chest: Effort normal. She has wheezes. She has no rales.  Abdominal: Soft. Bowel sounds are normal. She exhibits no distension and no mass. There is no tenderness. There is no rebound and no guarding.  Lymphadenopathy:    She has no cervical adenopathy.  Neurological: She is alert and oriented to person, place, and time. No cranial nerve deficit.  Skin: Skin is warm and dry. No rash noted. She is not diaphoretic. No erythema. No pallor.  Psychiatric: She has a normal mood and affect. Her behavior is normal.   Results for orders placed or performed in visit on 12/28/14  POCT CBC  Result Value Ref Range   WBC 15.4 (A) 4.6 - 10.2 K/uL   Lymph, poc 3.0 0.6 - 3.4   POC LYMPH PERCENT 19.4 10 - 50 %L   MID (cbc) 0.9 0 - 0.9   POC MID % 6.1 0 - 12 %M   POC Granulocyte 11.5 (A) 2 - 6.9   Granulocyte percent 74.5 37 - 80 %G   RBC 5.80 (A) 4.04 - 5.48 M/uL   Hemoglobin 12.7 12.2 - 16.2 g/dL   HCT, POC 91.4 78.2 - 47.9 %   MCV 70.3 (A) 80 - 97 fL   MCH, POC 21.9 (A) 27 - 31.2 pg   MCHC 31.1 (A) 31.8 - 35.4 g/dL   RDW, POC 95.6 %   Platelet Count, POC 478 (A) 142 - 424 K/uL   MPV 7.3 0 - 99.8 fL  POCT glucose (manual entry)  Result Value Ref Range   POC Glucose 223 (A) 70 - 99 mg/dl   PEAK FLOW: 213, 086, 230.    Assessment & Plan:   1. Bronchospasm   2. Lower respiratory  infection   3. Type 2 diabetes mellitus with diabetic polyneuropathy   4. Wegener's disease, pulmonary   5. Asthma with acute exacerbation, mild persistent     1.  Asthma exacerbation/acute bronchospasm: persistent; add Prednisone taper; continue Albuterol every 4-6 hours scheduled and then PRN. Refer to pulmonology per request of rheumatology. 2.  Lower respiratory infection with acute sinusitis as well:  New.  Worsening WBC count since hospital discharge and low grade fever; start Levaquin  daily for eleven days for sinusitis and lower respiratory infection.  Continue Tussionex for cough.  Add Flonase and nasal saline rinse daily.  Viral studies +metopneumovirus during recent admission; records reviewed in detail; discharge WBC of 7.9.    Work note provided. 3.  Wegener's disease: stable; no apparent exacerbation during admission. 4.  DMII: moderately controlled; monitor sugars closely with Prednisone taper and acute infection.    Meds ordered this encounter  Medications  . predniSONE (DELTASONE) 20 MG tablet    Sig: Two tablets daily x 5 days then one tablet daily x 5 days    Dispense:  15 tablet    Refill:  0  . levofloxacin (LEVAQUIN) 750 MG tablet    Sig: Take 1 tablet (750 mg total) by mouth daily.    Dispense:  7 tablet    Refill:  0    No Follow-up on file.    Amie Cowens Paulita Fujita, M.D. [facility] [address] [place], [place]  [address] [phone] phone [phone] fax

## 2015-01-04 ENCOUNTER — Encounter: Payer: Self-pay | Admitting: Family Medicine

## 2015-01-16 ENCOUNTER — Encounter: Payer: Self-pay | Admitting: Internal Medicine

## 2015-01-16 ENCOUNTER — Ambulatory Visit (INDEPENDENT_AMBULATORY_CARE_PROVIDER_SITE_OTHER): Payer: BC Managed Care – PPO | Admitting: Internal Medicine

## 2015-01-16 VITALS — BP 118/86 | HR 107 | Temp 98.2°F | Ht 65.0 in | Wt 271.0 lb

## 2015-01-16 DIAGNOSIS — M3130 Wegener's granulomatosis without renal involvement: Secondary | ICD-10-CM

## 2015-01-16 NOTE — Patient Instructions (Signed)
Follow up with Dr. Dema SeverinMungal in 2 weeks - pulmonary function testing prior to next visit - continue with flonase for post nasal drip - avoid sick contact - continue with close follow up [facility]

## 2015-01-16 NOTE — Progress Notes (Signed)
Date: 01/16/2015  MRN# [id number] Rachel Walls 04/02/1966  Referring Physician:   ANGEE GUPTON is a 49 y.o. old female seen in consultation for Wegner's Vasculitis  CC: "history of Wegner's, advised to establish with pulmonologist" Chief Complaint  Patient presents with  . Advice Only    Pt referred for asthma exacerbations: She was in recently discharged from [facility]. Pt reports breathing better now.    HPI:  Patient is a pleasant 49 year old female is presenting today for established care visit pulmonary Wegener's granulomatosis.  Patient has a past medical history of Wegener's granulomatosis/vasculitis, hypertension, hyperlipidemia, obesity, acid reflux disease, diabetes seen in consultation today as a referral for established care with a pulmonologist for a history of Wegener's granulomatosis. Patient has a known history of Wegener's diagnosed by wedge biopsy and kidney biopsy more than 18 years ago. Brief history per patient: she is originally from [place] and was getting recurrent respiratory tract infections, had significant workup while she lived in [place], however, 18 years ago she moved to the [place] when she was following multiple specialists within the [facility] system for her recurrent respiratory tract infections , she had a wedge biopsy and kidney biopsy performed approximately 18 years ago which was revealing of Wegener's vasculitis. Since then she has been on CellCept and following with rheumatology. She previously followed with [facility] pulmonary, however has not seen them in a number of years and would like to establish with a local pulmonologist. Over the past 5 years she denies any significant hemoptysis, hematuria, bloody stools, recurrent respiratory tract infections (upper and lower), recurrent sinusitis, and nose bleeds.  The one exception to the previous statement, is that she recently had a respiratory tract infection in January of 2016 (see below),  for which she was diagnosed with metapneumo virus while admitted to [facility]. With reference to her Wegener's, she states that she is very well controlled on her current immunosuppressive (CellCept) and usually does not have any significant respiratory tract infections, occasionally she may have skin lesions but these spontaneously remit. She was recently diagnosed with a relapse respiratory tract infection and treated with prednisone and Levaquin by her primary care physician in February 2016. Currently patient states she's back to her baseline breathing and baseline health, she denies any further productive sputum, cough, nose bleeds, hemoptysis, hematuria, and hematochezia.   Chart Review performed by Dr. Dema Severin: Jan 2016: presented to Urgent Care in [place] who performed CXR who was concerned about ?blood clot. Sent to [facility]; s/p CT scan negative for PE. ED prescribed abx (not sure of name/Levofloxacin). Then went home and worsened from cough, SOB. Called Dr. Dooley/rheumatologist who saw patient; Terie Purser has no hospital privileges; Terie Purser heard crackling; tried to get into pulmonary clinic but could not be seen that day. Dooley sent to [facility] ED; evaluated by pulmonologist who spoke with Terie Purser and decided to admit pt. Administered three iv abx initially; stopped abx due to viral infection. Final viral resp cultures showed metopneumovirus. Discharged on inhaler. Dr. Terie Purser had given Tussionex which is helpful for sleep at night; sleeping 12 hours per day. Out of school for a week.  Dr. Terie Purser sent pt to PCP; also Terie Purser request that patient find a local pulmonologist. CT scan has not changed any since last CT in 2009. No flare of Wegner's. Started with a simple sinus infection. Discharge WBC of 7.9.  On 12/28/14- noted to be very tired, excessive coughing, still quite congested. No fever since discharge; during  admission 101.3. Today 99; intermittent chills and sweats. No headache  now. L ear is ringing; normal hearing. +nasal congestion moderate clear-thick and thick yellow. No sinus pressure. Rare PND. +sputum brownish. Wheezing a lot; using Albuterol every four hours. No n/v/d. Husband is not sick.  -started on prednisone taper and levaquin  PMHX:   Past Medical History  Diagnosis Date  . Wegener's granulomatosis   . GERD (gastroesophageal reflux disease)   . Hypothyroidism   . Diabetes mellitus     Type 2  . Psychic factors associated with disease     Psychogenic factors with other diseases  . Hypertension   . Nonspecific elevation of levels of transaminase or lactic acid dehydrogenase (LDH)   . Other and unspecified hyperlipidemia   . Allergic rhinitis, cause unspecified   . Unspecified vitamin D deficiency   . Obesity, unspecified   . Hypertensive retinopathy   . Venous engorgement of retina   . Chronic kidney disease   . Anemia   . Anxiety   . Asthma    Surgical Hx:  Past Surgical History  Procedure Laterality Date  . Calcaneous bone spur surgery      x2  . Tonsillectomy    . Renal biopsy  1999  . Lung biopsy  1999  . 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 Abdominal ultrasound  05/17/2012    severe hepatomegaly at 22 cm; gallbladder normal.   [facility].  . Esophagogastroduodenoscopy  08/19/12    diffuse gastritis antrum; pathology negative for malignancy, H. Pylori; esophagus and duodenum normal.  . Ct abdomen  06/10/12    Hepatomegaly.  [facility].  Gery Pray scan  08/27/12    normal; EF 62%.  [facility].  . Meniscus tear      L; x 3 tears.  [facility].   Family Hx:  Family History  Problem Relation Age of Onset  . Hyperlipidemia Mother     Hypercholesterolemia  . Arthritis Mother     OA  . Sarcoidosis Father   . Heart disease Maternal Grandfather   . Emphysema Maternal Grandfather     Cause of death  . Diabetes Maternal Grandfather   . Heart failure Paternal Grandfather     CHF  . Heart disease Paternal  Grandfather   . Asthma Brother   . Alcohol abuse Brother   . Emphysema Maternal Grandmother    Social Hx:   History  Substance Use Topics  . Smoking status: Never Smoker   . Smokeless tobacco: Never Used     Comment: Tobacco use-no  . Alcohol Use: No   Medication:   Current Outpatient Rx  Name  Route  Sig  Dispense  Refill  . albuterol (PROVENTIL HFA;VENTOLIN HFA) 108 (90 BASE) MCG/ACT inhaler   Inhalation   Inhale 90 mcg into the lungs every 4 (four) hours as needed.         . Canagliflozin 300 MG TABS   Oral   Take 1 tablet (300 mg total) by mouth daily.   30 tablet   11   . Cholecalciferol (VITAMIN D3) 1000 UNITS CAPS   Oral   Take 1,000 mg by mouth daily.         . DULoxetine (CYMBALTA) 60 MG capsule   Oral   Take 1 capsule (60 mg total) by mouth 2 (two) times daily.   60 capsule   5   . fluticasone (FLONASE) 50 MCG/ACT nasal spray   Each  Nare   Place 2 sprays into both nostrils daily.   16 g   11   . gabapentin (NEURONTIN) 800 MG tablet   Oral   Take 1 tablet (800 mg total) by mouth 3 (three) times daily.   90 tablet   11   . glipiZIDE (GLUCOTROL XL) 10 MG 24 hr tablet   Oral   Take 1 tablet (10 mg total) by mouth daily with breakfast.   30 tablet   11   . lansoprazole (PREVACID) 30 MG capsule   Oral   Take 1 capsule (30 mg total) by mouth daily.   90 capsule   1   . levothyroxine (SYNTHROID, LEVOTHROID) 100 MCG tablet   Oral   Take 1 tablet (100 mcg total) by mouth daily.   30 tablet   11   . loratadine (CLARITIN) 10 MG tablet   Oral   Take 10 mg by mouth daily as needed.          . metFORMIN (GLUCOPHAGE) 500 MG tablet   Oral   Take 1 tablet (500 mg total) by mouth every evening. With food.   30 tablet   11   . mycophenolate (CELLCEPT) 250 MG capsule   Oral   Take 250 mg by mouth at bedtime. 2 tab in the morning & 1 tab at night.         . pantoprazole (PROTONIX) 40 MG tablet   Oral   Take 1 tablet (40 mg total) by mouth  daily.   90 tablet   3   . ramipril (ALTACE) 5 MG capsule   Oral   Take 1 capsule (5 mg total) by mouth daily.   30 capsule   11   . sitaGLIPtin (JANUVIA) 100 MG tablet   Oral   Take 1 tablet (100 mg total) by mouth daily. Take with food   30 tablet   11   . Vitamin D, Ergocalciferol, (DRISDOL) 50000 UNITS CAPS capsule   Oral   Take 1 capsule by mouth once a week.             Allergies:  Penicillins and Sulfonamide derivatives  Review of Systems: Gen:  Denies  fever, sweats, chills HEENT: Denies blurred vision, double vision, ear pain, eye pain, hearing loss, nose bleeds, sore throat Cvc:  No dizziness, chest pain or heaviness Resp:   Denies cough or sputum porduction, shortness of breath Gi: Denies swallowing difficulty, stomach pain, nausea or vomiting, diarrhea, constipation, bowel incontinence Gu:  Denies bladder incontinence, burning urine Ext:   No Joint pain, stiffness or swelling Skin: No skin rash, easy bruising or bleeding or hives Endoc:  No polyuria, polydipsia , polyphagia or weight change Psych: No depression, insomnia or hallucinations  Other:  All other systems negative  Physical Examination:   VS: BP 118/86 mmHg  Pulse 107  Temp(Src) 98.2 F (36.8 C) (Oral)  Ht 5\' 5"  (1.651 m)  Wt 271 lb (122.925 kg)  BMI 45.10 kg/m2  SpO2 98%  LMP 11/17/2014  General Appearance: No distress  Neuro:without focal findings, mental status, speech normal, alert and oriented, cranial nerves 2-12 intact, reflexes normal and symmetric, sensation grossly normal  HEENT: PERRLA, EOM intact, no ptosis, no other lesions noticed; Mallampati 3 Pulmonary: normal breath sounds., diaphragmatic excursion normal.No wheezing, No rales;   Sputum Production:  none CardiovascularNormal S1,S2.  No m/r/g.  Abdominal aorta pulsation normal.    Abdomen: Benign, Soft, non-tender, No masses, hepatosplenomegaly, No lymphadenopathy Renal:  No costovertebral tenderness  GU:  No performed  at this time. Endoc: No evident thyromegaly, no signs of acromegaly or Cushing features Skin:   warm, no rashes, no ecchymosis  Extremities: normal, no cyanosis, clubbing, no edema, warm with normal capillary refill. Other findings:none   Rad results: (The following images and results were reviewed by Dr. Dema SeverinMungal).  CT Chest 12/19/14 REASON FOR EXAM:    cp sob COMMENTS:     PROCEDURE: CT  - CT ANGIOGRAPHY CHEST W FOR PE  - Dec 19 2014  7:51PM   CLINICAL DATA:  Acute onset of chest pain and shortness of breath. Initial encounter.  EXAM: CT ANGIOGRAPHY CHEST WITH CONTRAST  TECHNIQUE: Multidetector CT imaging of the chest was performed using the standard protocol during bolus administration of intravenous contrast. Multiplanar CT image reconstructions and MIPs were obtained to evaluate the vascular anatomy.  CONTRAST:  100 mL of Omnipaque 350 IV contrast  COMPARISON:  Chest radiograph performed earlier today at 4:51 p.m.  FINDINGS: There is no evidence of pulmonary embolus.  Prominent areas of bronchiectasis and cystic change are noted at the lung apices and about the hila, more prominent on the right. There is a 7 mm nodule at the right lung apex (image 19 of 142), and a 6 mm nodule near the left lung apex (image 31 of 142). Calcification is noted at the right lung base, possibly reflecting prior surgery. There is no evidence of significant focal consolidation, pleural effusion or pneumothorax. No masses are identified; no abnormal focal contrast enhancement is seen.  A 1.0 cm lymph node is noted in the subcarinal region. There is a 1.0 cm right hilar node, and a 0.9 cm right peribronchial node. No pericardial effusion is identified. The great vessels are grossly unremarkable in appearance. The visualized portions of thyroid gland are unremarkable. No axillary lymphadenopathy is appreciated.  The visualized portions of the liver and spleen are unremarkable. The visualized  portions of the pancreas, gallbladder, stomach, adrenal glands and kidneys are within normal limits.  No acute osseous abnormalities are seen. Review of the MIP images confirms the above findings.   IMPRESSION: 1. No evidence of pulmonary embolus. 2. Prominent areas of bronchiectasis and cystic change at the lung apices and about the hila, more prominent on the right. This may reflect an underlying systemic inflammatory process, or possibly COPD. Chronic infection is thought to be less likely. 3. 7 mm nodule at the right lung apex, and 6 mm nodule near the left lung apex. If the patient is at high risk for bronchogenic carcinoma, follow-up chest CT at 3-6 months is recommended. If the patient is at low risk for bronchogenic carcinoma, follow-up chest CT at 6-12 months is recommended. This recommendation follows the consensus statement: Guidelines for Management of Small Pulmonary Nodules Detected on CT Scans: A Statement from the Fleischner Society as published in Radiology 2005; 237:395-400. 4. Visualized mediastinal and right hilar and peribronchial nodes are borderline normal in size.  Verified By: JEFFREY . Cherly HensenHANG, M.D.,  09/05/08 CT Chest  09/05/08 09:41:00 [id number] [facility] : CT CHEST WO CONTRAST  DICTATION: 09/05/08 10:32:10  CLINICAL: 49 Years Old (F) History of sob , cough.  TECHNIQUE: Axial thin section imaging through the thorax is obtained without contrast. Coronal reformatted imaging was provided to further evaluate the lung parenchyma.  For all [facility] exams, radiation dose reduction device (automated exposure control) is used or manual techniques with radiation dose As Low As Reasonably Achievable (ALARA) protocol are followed  using age and patient-size-specific scan parameters, while maintaining the necessary diagnostic image quality.   COMPARISON: 03/22/08.   FINDINGS:  No pathologically enlarged axillary or hilar lymph nodes are identified. There is a  1.1 cm subcarinal lymph node, grossly unchanged. There are subcentimeter mediastinal lymph nodes, which are stable in comparison to the prior study, the largest measuring 9 mm on image 18. No pericardial effusion identified.  The main pulmonary artery is dilated, similar in comparison to the prior study.  There is no pleural effusion or pneumothorax. Again noted are multiple small bilateral pulmonary nodules, including an ill-defined 4-mm left upper lobe nodule on image 15, a 5-mm left lower lobe nodule on image 32, an ill-defined 6-mm right lower lobe nodule on image 23, and a 6-mm right apical nodule on image 9, stable. Coarse reticular opacities and bronchiectasis again noted in the anterior segment of the bilateral upper lobes, right greater than left, as well as the medial right middle lobe and bilateral perihilar regions, similar in comparison to the prior study.  Fat density lesion in the right costophrenic sulcus is stable. Noncontrast evaluation of the visualized portion of the upper abdomen is grossly unremarkable. No worrisome~osseous lesion identified.  IMPRESSION: 1. Stable appearing interstitial changes, as above. 2. Stable subcentimeter bilateral pulmonary nodules. Given these were not present on the 2007 CT, continued follow-up CT in 6 months is recommended to document continued stability. 3. Dilated main pulmonary artery, suggestive of pulmonary arterial hypertension. 4. Stable mildly prominent mediastinal lymph nodes.   Lab results:   Metapneumo Virus Panel [facility] - Positive Jan 2016  ANCA Screen : Comment: THESE RESULTS HAVE UNCERTAIN DIAGNOSTIC SIGNIFICANCE IFA : (A) Comment: POSITIVE, PERINUCLEAR PATTERN NEGATIVE Serine PR3 (ANCA) NEGATIVE NEGATIVE PR3-Quant 2.5 <=20 U/mL MPO-Elisa NEGATIVE NEGATIVE MPO-Quant 9.5 <=20 U/mL    Assessment and Plan: Wegener's granulomatosis Pulmonary Wegener's granulomatosis-from a pulmonary standpoint patient is  relatively stable, she has completed induction therapy more 18 years ago and is currently on CellCept for maintenance therapy. History reveals no significant relapses that would suggest she needs any change in therapy or initiating any type of reinduction therapy.  Plan: -Patient has already had biopsy-proven Wegener's and needs no further workup at this time from a pulmonary standpoint. -Pulmonary Wegener's is currently stable. Patient will have an occasional respiratory tract infection which can be treated symptomatically with antibiotics and/or steroids. -I've had a prolonged discussion with the patient today regarding her pulmonary Wegener's, and given her current history of no multiple  Respiratory tract infections over the past 3-5 years, there is no further need to change any type of regular therapy at this time. If this does become an issue, will further discuss with her rheumatologist. -further monitoring and therapy as dictated by rheumatology -Pulmonary function testing prior to next visit.      Updated Medication List Outpatient Encounter Prescriptions as of 01/16/2015  Medication Sig  . albuterol (PROVENTIL HFA;VENTOLIN HFA) 108 (90 BASE) MCG/ACT inhaler Inhale 90 mcg into the lungs every 4 (four) hours as needed.  . Canagliflozin 300 MG TABS Take 1 tablet (300 mg total) by mouth daily.  . Cholecalciferol (VITAMIN D3) 1000 UNITS CAPS Take 1,000 mg by mouth daily.  . DULoxetine (CYMBALTA) 60 MG capsule Take 1 capsule (60 mg total) by mouth 2 (two) times daily.  . fluticasone (FLONASE) 50 MCG/ACT nasal spray Place 2 sprays into both nostrils daily.  Marland Kitchen gabapentin (NEURONTIN) 800 MG tablet Take 1 tablet (800 mg total) by mouth 3 (three) times  daily.  . glipiZIDE (GLUCOTROL XL) 10 MG 24 hr tablet Take 1 tablet (10 mg total) by mouth daily with breakfast.  . lansoprazole (PREVACID) 30 MG capsule Take 1 capsule (30 mg total) by mouth daily.  Marland Kitchen levothyroxine (SYNTHROID, LEVOTHROID) 100  MCG tablet Take 1 tablet (100 mcg total) by mouth daily.  Marland Kitchen loratadine (CLARITIN) 10 MG tablet Take 10 mg by mouth daily as needed.   . metFORMIN (GLUCOPHAGE) 500 MG tablet Take 1 tablet (500 mg total) by mouth every evening. With food.  . mycophenolate (CELLCEPT) 250 MG capsule Take 250 mg by mouth at bedtime. 2 tab in the morning & 1 tab at night.  . pantoprazole (PROTONIX) 40 MG tablet Take 1 tablet (40 mg total) by mouth daily.  . ramipril (ALTACE) 5 MG capsule Take 1 capsule (5 mg total) by mouth daily.  . sitaGLIPtin (JANUVIA) 100 MG tablet Take 1 tablet (100 mg total) by mouth daily. Take with food  . Vitamin D, Ergocalciferol, (DRISDOL) 50000 UNITS CAPS capsule Take 1 capsule by mouth once a week.  . [DISCONTINUED] Canagliflozin 100 MG TABS Take 1 tablet (100 mg total) by mouth daily.  . [DISCONTINUED] ipratropium (ATROVENT) 0.03 % nasal spray Place 2 sprays into the nose 2 (two) times daily. (Patient not taking: Reported on 12/28/2014)  . [DISCONTINUED] levofloxacin (LEVAQUIN) 750 MG tablet Take 1 tablet (750 mg total) by mouth daily. (Patient not taking: Reported on 01/16/2015)  . [DISCONTINUED] predniSONE (DELTASONE) 20 MG tablet Two tablets daily x 5 days then one tablet daily x 5 days (Patient not taking: Reported on 01/16/2015)    Orders for this visit: No orders of the defined types were placed in this encounter.   Thank  you for the consultation and for allowing [facility] to assist in the care of your patient. Our recommendations are noted above.  Please contact us if we can be of further service.   Stephanie Acre, MD [facility] Office Number: [phone]

## 2015-01-17 NOTE — Assessment & Plan Note (Addendum)
Pulmonary Wegener's granulomatosis-from a pulmonary standpoint patient is relatively stable, she has completed induction therapy more 18 years ago and is currently on CellCept for maintenance therapy. History reveals no significant relapses that would suggest she needs any change in therapy or initiating any type of reinduction therapy.  Plan: -Patient has already had biopsy-proven Wegener's and needs no further workup at this time from a pulmonary standpoint. -Pulmonary Wegener's is currently stable. Patient will have an occasional respiratory tract infection which can be treated symptomatically with antibiotics and/or steroids. -I've had a prolonged discussion with the patient today regarding her pulmonary Wegener's, and given her current history of no multiple  Respiratory tract infections over the past 3-5 years, there is no further need to change any type of regular therapy at this time. If this does become an issue, will further discuss with her rheumatologist. -further monitoring and therapy as dictated by rheumatology -Pulmonary function testing prior to next visit.

## 2015-01-25 ENCOUNTER — Other Ambulatory Visit: Payer: Self-pay | Admitting: Internal Medicine

## 2015-01-25 DIAGNOSIS — M3130 Wegener's granulomatosis without renal involvement: Secondary | ICD-10-CM

## 2015-01-31 ENCOUNTER — Ambulatory Visit (INDEPENDENT_AMBULATORY_CARE_PROVIDER_SITE_OTHER): Payer: BC Managed Care – PPO | Admitting: Family Medicine

## 2015-01-31 ENCOUNTER — Encounter: Payer: Self-pay | Admitting: Family Medicine

## 2015-01-31 VITALS — BP 125/82 | HR 104 | Temp 97.9°F | Resp 16 | Ht 65.0 in | Wt 269.8 lb

## 2015-01-31 DIAGNOSIS — J0100 Acute maxillary sinusitis, unspecified: Secondary | ICD-10-CM

## 2015-01-31 DIAGNOSIS — S39012A Strain of muscle, fascia and tendon of lower back, initial encounter: Secondary | ICD-10-CM

## 2015-01-31 DIAGNOSIS — I10 Essential (primary) hypertension: Secondary | ICD-10-CM | POA: Diagnosis not present

## 2015-01-31 DIAGNOSIS — D509 Iron deficiency anemia, unspecified: Secondary | ICD-10-CM

## 2015-01-31 DIAGNOSIS — E1142 Type 2 diabetes mellitus with diabetic polyneuropathy: Secondary | ICD-10-CM | POA: Diagnosis not present

## 2015-01-31 DIAGNOSIS — K219 Gastro-esophageal reflux disease without esophagitis: Secondary | ICD-10-CM | POA: Diagnosis not present

## 2015-01-31 DIAGNOSIS — M3130 Wegener's granulomatosis without renal involvement: Secondary | ICD-10-CM

## 2015-01-31 LAB — COMPREHENSIVE METABOLIC PANEL
ALK PHOS: 57 U/L (ref 39–117)
ALT: 14 U/L (ref 0–35)
AST: 13 U/L (ref 0–37)
Albumin: 3.8 g/dL (ref 3.5–5.2)
BUN: 19 mg/dL (ref 6–23)
CO2: 24 meq/L (ref 19–32)
CREATININE: 0.91 mg/dL (ref 0.50–1.10)
Calcium: 9 mg/dL (ref 8.4–10.5)
Chloride: 102 mEq/L (ref 96–112)
Glucose, Bld: 194 mg/dL — ABNORMAL HIGH (ref 70–99)
POTASSIUM: 4.4 meq/L (ref 3.5–5.3)
SODIUM: 135 meq/L (ref 135–145)
Total Bilirubin: 0.4 mg/dL (ref 0.2–1.2)
Total Protein: 6.6 g/dL (ref 6.0–8.3)

## 2015-01-31 LAB — CBC WITH DIFFERENTIAL/PLATELET
Basophils Absolute: 0 10*3/uL (ref 0.0–0.1)
Basophils Relative: 0 % (ref 0–1)
Eosinophils Absolute: 0.3 10*3/uL (ref 0.0–0.7)
Eosinophils Relative: 3 % (ref 0–5)
HCT: 35 % — ABNORMAL LOW (ref 36.0–46.0)
Hemoglobin: 11.2 g/dL — ABNORMAL LOW (ref 12.0–15.0)
LYMPHS ABS: 2.4 10*3/uL (ref 0.7–4.0)
Lymphocytes Relative: 21 % (ref 12–46)
MCH: 22.1 pg — ABNORMAL LOW (ref 26.0–34.0)
MCHC: 32 g/dL (ref 30.0–36.0)
MCV: 69 fL — ABNORMAL LOW (ref 78.0–100.0)
MPV: 9.1 fL (ref 8.6–12.4)
Monocytes Absolute: 0.9 10*3/uL (ref 0.1–1.0)
Monocytes Relative: 8 % (ref 3–12)
Neutro Abs: 7.7 10*3/uL (ref 1.7–7.7)
Neutrophils Relative %: 68 % (ref 43–77)
Platelets: 452 10*3/uL — ABNORMAL HIGH (ref 150–400)
RBC: 5.07 MIL/uL (ref 3.87–5.11)
RDW: 17.6 % — ABNORMAL HIGH (ref 11.5–15.5)
WBC: 11.3 10*3/uL — AB (ref 4.0–10.5)

## 2015-01-31 LAB — POCT GLYCOSYLATED HEMOGLOBIN (HGB A1C): Hemoglobin A1C: 8.1

## 2015-01-31 LAB — IRON: Iron: 27 ug/dL — ABNORMAL LOW (ref 42–145)

## 2015-01-31 MED ORDER — LEVOFLOXACIN 750 MG PO TABS
750.0000 mg | ORAL_TABLET | Freq: Every day | ORAL | Status: DC
Start: 2015-01-31 — End: 2015-08-10

## 2015-01-31 MED ORDER — METHOCARBAMOL 500 MG PO TABS: 500.0000 mg | ORAL_TABLET | Freq: Every evening | ORAL | Status: DC | PRN

## 2015-01-31 NOTE — Progress Notes (Signed)
Subjective:    Patient ID: Rachel Walls, female    DOB: May 10, 1966, 49 y.o.   MRN: [id number]  01/31/2015  Diabetes; Sinusitis; and Back Pain   HPI This 49 y.o. female presents for three month follow-up:  1.  Sinus congestion: onset unknown approximately five days ago.  No fever/chills/sweats.  Mild headache.  No ear pain.  +dizziness.  No sore throat.  +rhinorrhea now greenish brown.  +nasal congestion. +hoarseness.  +coughing. Concerned about recurrent pneumonia.  No SOB.  No v/d.  Using Flonase.  Suffers with excessive dryness so avoids Claritin or antihistamines.  No medication for cough; still has Robitussin with codeine.    2.  DMII:  HgbA1c of 8.7 in 10/2014.  Sugars have been running good.  Taking Invokana  daily.  Metformin  once daily.  Glipizide XL  daily.  Januvia  daily.    3. Low back pain:  Adopted a kitten that is six weeks ago.  Has two four year old cats; also has a dog.  When cat ran down the tree, turned quickly and twisted back.  B lower back pain.  Pain is worse at night.  During the day, taking Advil.  Concerned about Advil and Gabapentin together.  Took nothing last night.  No radiation into legs.  No n/t/w that is new.  Radiates to L>R hip areas.   4.  B ankle pain and swelling: s/p injections in B ankles.    5. Anemia iron deficiency: taking iron supplement for one week.  Felt better on iron supplement/stress tablet iron supplement.    6.  HTN: Patient reports good compliance with medication, good tolerance to medication, and good symptom control.    7. GERD: Patient reports good compliance with medication, good tolerance to medication, and good symptom control.  Taking Prevacid daily.  Wants to discuss.  Dementia?  Alzheimer's?     Review of Systems  Constitutional: Negative for fever, chills, diaphoresis and fatigue.  HENT: Positive for congestion, postnasal drip, rhinorrhea, sinus pressure and voice change. Negative for ear pain and sore  throat.   Eyes: Negative for visual disturbance.  Respiratory: Negative for cough and shortness of breath.   Cardiovascular: Negative for chest pain, palpitations and leg swelling.  Gastrointestinal: Negative for nausea, vomiting, abdominal pain, diarrhea and constipation.  Endocrine: Negative for cold intolerance, heat intolerance, polydipsia, polyphagia and polyuria.  Musculoskeletal: Positive for back pain.  Neurological: Negative for dizziness, tremors, seizures, syncope, facial asymmetry, speech difficulty, weakness, light-headedness, numbness and headaches.  Psychiatric/Behavioral: Negative for suicidal ideas, sleep disturbance, self-injury and dysphoric mood. The patient is not nervous/anxious.     Past Medical History  Diagnosis Date  . Wegener's granulomatosis   . GERD (gastroesophageal reflux disease)   . Hypothyroidism   . Diabetes mellitus     Type 2  . Psychic factors associated with disease     Psychogenic factors with other diseases  . Hypertension   . Nonspecific elevation of levels of transaminase or lactic acid dehydrogenase (LDH)   . Other and unspecified hyperlipidemia   . Allergic rhinitis, cause unspecified   . Unspecified vitamin D deficiency   . Obesity, unspecified   . Hypertensive retinopathy   . Venous engorgement of retina   . Chronic kidney disease   . Anemia   . Anxiety   . Asthma    Past Surgical History  Procedure Laterality Date  . Calcaneous bone spur surgery      x2  . Tonsillectomy    .  Renal biopsy  1999  . Lung biopsy  1999  . 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 Abdominal ultrasound  05/17/2012    severe hepatomegaly at 22 cm; gallbladder normal.   [facility].  . Esophagogastroduodenoscopy  08/19/12    diffuse gastritis antrum; pathology negative for malignancy, H. Pylori; esophagus and duodenum normal.  . Ct abdomen  06/10/12    Hepatomegaly.  [facility].  [facility] Pray scan  08/27/12    normal; EF 62%.  [facility].  . Meniscus  tear      L; x 3 tears.  [facility].   Allergies  Allergen Reactions  . Penicillins Hives  . Sulfonamide Derivatives     Reaction: arthralgias.   Current Outpatient Prescriptions  Medication Sig Dispense Refill  . albuterol (PROVENTIL HFA;VENTOLIN HFA) 108 (90 BASE) MCG/ACT inhaler Inhale 90 mcg into the lungs every 4 (four) hours as needed.    . Canagliflozin 300 MG TABS Take 1 tablet (300 mg total) by mouth daily. 30 tablet 11  . Cholecalciferol (VITAMIN D3) 1000 UNITS CAPS Take 1,000 mg by mouth daily.    . DULoxetine (CYMBALTA) 60 MG capsule Take 1 capsule (60 mg total) by mouth 2 (two) times daily. 60 capsule 5  . fluticasone (FLONASE) 50 MCG/ACT nasal spray Place 2 sprays into both nostrils daily. 16 g 11  . gabapentin (NEURONTIN) 800 MG tablet Take 1 tablet (800 mg total) by mouth 3 (three) times daily. 90 tablet 11  . glipiZIDE (GLUCOTROL XL) 10 MG 24 hr tablet Take 1 tablet (10 mg total) by mouth daily with breakfast. 30 tablet 11  . lansoprazole (PREVACID) 30 MG capsule Take 1 capsule (30 mg total) by mouth daily. 90 capsule 1  . levothyroxine (SYNTHROID, LEVOTHROID) 100 MCG tablet Take 1 tablet (100 mcg total) by mouth daily. 30 tablet 11  . loratadine (CLARITIN) 10 MG tablet Take 10 mg by mouth daily as needed.     . metFORMIN (GLUCOPHAGE) 500 MG tablet Take 1 tablet (500 mg total) by mouth every evening. With food. 30 tablet 11  . mycophenolate (CELLCEPT) 250 MG capsule Take 250 mg by mouth at bedtime. 2 tab in the morning & 1 tab at night.    . ramipril (ALTACE) 5 MG capsule Take 1 capsule (5 mg total) by mouth daily. 30 capsule 11  . sitaGLIPtin (JANUVIA) 100 MG tablet Take 1 tablet (100 mg total) by mouth daily. Take with food 30 tablet 11  . levofloxacin (LEVAQUIN) 750 MG tablet Take 1 tablet (750 mg total) by mouth daily. 10 tablet 0  . methocarbamol (ROBAXIN) 500 MG tablet Take 1-2 tablets (500-1,000 mg total) by mouth at bedtime as needed for muscle spasms. 40  tablet 0  . Vitamin D, Ergocalciferol, (DRISDOL) 50000 UNITS CAPS capsule Take 1 capsule by mouth once a week.     No current facility-administered medications for this visit.       Objective:    BP 125/82 mmHg  Pulse 104  Temp(Src) 97.9 F (36.6 C) (Oral)  Resp 16  Ht  (1.651 m)  Wt 269 lb 12.8 oz (122.38 kg)  BMI 44.90 kg/m2  SpO2 97%  LMP 01/10/2015 Physical Exam  Constitutional: She is oriented to person, place, and time. She appears well-developed and well-nourished. No distress.  HENT:  Head: Normocephalic and atraumatic.  Right Ear: Tympanic membrane, external ear and ear canal normal.  Left Ear: Tympanic membrane, external ear and ear canal  normal.  Nose: Mucosal edema and rhinorrhea present. Right sinus exhibits maxillary sinus tenderness and frontal sinus tenderness. Left sinus exhibits maxillary sinus tenderness and frontal sinus tenderness.  Mouth/Throat: Oropharynx is clear and moist.  Eyes: Conjunctivae and EOM are normal. Pupils are equal, round, and reactive to light.  Neck: Normal range of motion. Neck supple. Carotid bruit is not present. No thyromegaly present.  Cardiovascular: Normal rate, regular rhythm, normal heart sounds and intact distal pulses.  Exam reveals no gallop and no friction rub.   No murmur heard. Pulmonary/Chest: Effort normal and breath sounds normal. She has no wheezes. She has no rales.  Abdominal: Soft. Bowel sounds are normal. She exhibits no distension and no mass. There is no tenderness. There is no rebound and no guarding.  Musculoskeletal:       Lumbar back: She exhibits decreased range of motion, tenderness and pain. She exhibits no bony tenderness.  Lumbar spine:  Non-tender midline; +tender paraspinal regions B.  Straight leg raises negative B; toe and heel walking intact; marching intact; motor 5/5 BLE.  Full ROM lumbar spine with pain reproduced.   Lymphadenopathy:    She has no cervical adenopathy.  Neurological: She is  alert and oriented to person, place, and time. No cranial nerve deficit.  Skin: Skin is warm and dry. No rash noted. She is not diaphoretic. No erythema. No pallor.  Psychiatric: She has a normal mood and affect. Her behavior is normal.        Assessment & Plan:   1. Anemia, iron deficiency   2. Type 2 diabetes mellitus with diabetic polyneuropathy   3. Acute maxillary sinusitis, recurrence not specified   4. Essential hypertension, benign   5. Lumbar strain, initial encounter   6. Wegener's syndrome   7. Gastroesophageal reflux disease without esophagitis     1.  DMII: improved; no changes to management at this time per patient request.  Continue to work on exercise, weight loss, and dietary modification. 2.  Iron deficiency anemia: stable; obtain labs. 3.  Acute maxillary sinusitis: New. Rx for Levaquin provided.  Recent pneumonia thus will treat aggressively. 4.  HTN: controlled; obtain labs. No change in therapy. 5.  Lumbar strain: New.  Rx for Robaxin provided; home exercise program provided.  Call in two weeks if not significantly improved; will warrant physical therapy. 6.  GERD: controlled; discussed side effects of medications at length; continue PPI as needed. 7.  Wegener's syndrome:  Stable.    Meds ordered this encounter  Medications  . methocarbamol (ROBAXIN) 500 MG tablet    Sig: Take 1-2 tablets (500-1,000 mg total) by mouth at bedtime as needed for muscle spasms.    Dispense:  40 tablet    Refill:  0  . levofloxacin (LEVAQUIN) 750 MG tablet    Sig: Take 1 tablet (750 mg total) by mouth daily.    Dispense:  10 tablet    Refill:  0    Return in about 3 months (around 05/03/2015) for recheck.    Cecilie Lowers.Kristi Martin Smith, M.D. [facility]  [place] [address] [place], [place]  [id number] [phone] phone [phone] fax

## 2015-01-31 NOTE — Patient Instructions (Signed)
Low Back Sprain with Rehab  A sprain is an injury in which a ligament is torn. The ligaments of the lower back are vulnerable to sprains. However, they are strong and require great force to be injured. These ligaments are important for stabilizing the spinal column. Sprains are classified into three categories. Grade 1 sprains cause pain, but the tendon is not lengthened. Grade 2 sprains include a lengthened ligament, due to the ligament being stretched or partially ruptured. With grade 2 sprains there is still function, although the function may be decreased. Grade 3 sprains involve a complete tear of the tendon or muscle, and function is usually impaired. SYMPTOMS   Severe pain in the lower back.  Sometimes, a feeling of a "pop," "snap," or tear, at the time of injury.  Tenderness and sometimes swelling at the injury site.  Uncommonly, bruising (contusion) within 48 hours of injury.  Muscle spasms in the back. CAUSES  Low back sprains occur when a force is placed on the ligaments that is greater than they can handle. Common causes of injury include:  Performing a stressful act while off-balance.  Repetitive stressful activities that involve movement of the lower back.  Direct hit (trauma) to the lower back. RISK INCREASES WITH:  Contact sports (football, wrestling).  Collisions (major skiing accidents).  Sports that require throwing or lifting (baseball, weightlifting).  Sports involving twisting of the spine (gymnastics, diving, tennis, golf).  Poor strength and flexibility.  Inadequate protection.  Previous back injury or surgery (especially fusion). PREVENTION  Wear properly fitted and padded protective equipment.  Warm up and stretch properly before activity.  Allow for adequate recovery between workouts.  Maintain physical fitness:  Strength, flexibility, and endurance.  Cardiovascular fitness.  Maintain a healthy body weight. PROGNOSIS  If treated  properly, low back sprains usually heal with non-surgical treatment. The length of time for healing depends on the severity of the injury.  RELATED COMPLICATIONS   Recurring symptoms, resulting in a chronic problem.  Chronic inflammation and pain in the low back.  Delayed healing or resolution of symptoms, especially if activity is resumed too soon.  Prolonged impairment.  Unstable or arthritic joints of the low back. TREATMENT  Treatment first involves the use of ice and medicine, to reduce pain and inflammation. The use of strengthening and stretching exercises may help reduce pain with activity. These exercises may be performed at home or with a therapist. Severe injuries may require referral to a therapist for further evaluation and treatment, such as ultrasound. Your caregiver may advise that you wear a back brace or corset, to help reduce pain and discomfort. Often, prolonged bed rest results in greater harm then benefit. Corticosteroid injections may be recommended. However, these should be reserved for the most serious cases. It is important to avoid using your back when lifting objects. At night, sleep on your back on a firm mattress, with a pillow placed under your knees. If non-surgical treatment is unsuccessful, surgery may be needed.  MEDICATION   If pain medicine is needed, nonsteroidal anti-inflammatory medicines (aspirin and ibuprofen), or other minor pain relievers (acetaminophen), are often advised.  Do not take pain medicine for 7 days before surgery.  Prescription pain relievers may be given, if your caregiver thinks they are needed. Use only as directed and only as much as you need.  Ointments applied to the skin may be helpful.  Corticosteroid injections may be given by your caregiver. These injections should be reserved for the most serious cases,   because they may only be given a certain number of times. HEAT AND COLD  Cold treatment (icing) should be applied for 10  to 15 minutes every 2 to 3 hours for inflammation and pain, and immediately after activity that aggravates your symptoms. Use ice packs or an ice massage.  Heat treatment may be used before performing stretching and strengthening activities prescribed by your caregiver, physical therapist, or athletic trainer. Use a heat pack or a warm water soak. SEEK MEDICAL CARE IF:   Symptoms get worse or do not improve in 2 to 4 weeks, despite treatment.  You develop numbness or weakness in either leg.  You lose bowel or bladder function.  Any of the following occur after surgery: fever, increased pain, swelling, redness, drainage of fluids, or bleeding in the affected area.  New, unexplained symptoms develop. (Drugs used in treatment may produce side effects.) EXERCISES  RANGE OF MOTION (ROM) AND STRETCHING EXERCISES - Low Back Sprain Most people with lower back pain will find that their symptoms get worse with excessive bending forward (flexion) or arching at the lower back (extension). The exercises that will help resolve your symptoms will focus on the opposite motion.  Your physician, physical therapist or athletic trainer will help you determine which exercises will be most helpful to resolve your lower back pain. Do not complete any exercises without first consulting with your caregiver. Discontinue any exercises which make your symptoms worse, until you speak to your caregiver. If you have pain, numbness or tingling which travels down into your buttocks, leg or foot, the goal of the therapy is for these symptoms to move closer to your back and eventually resolve. Sometimes, these leg symptoms will get better, but your lower back pain may worsen. This is often an indication of progress in your rehabilitation. Be very alert to any changes in your symptoms and the activities in which you participated in the 24 hours prior to the change. Sharing this information with your caregiver will allow him or her to  most efficiently treat your condition. These exercises may help you when beginning to rehabilitate your injury. Your symptoms may resolve with or without further involvement from your physician, physical therapist or athletic trainer. While completing these exercises, remember:   Restoring tissue flexibility helps normal motion to return to the joints. This allows healthier, less painful movement and activity.  An effective stretch should be held for at least 30 seconds.  A stretch should never be painful. You should only feel a gentle lengthening or release in the stretched tissue. FLEXION RANGE OF MOTION AND STRETCHING EXERCISES: STRETCH - Flexion, Single Knee to Chest   Lie on a firm bed or floor with both legs extended in front of you.  Keeping one leg in contact with the floor, bring your opposite knee to your chest. Hold your leg in place by either grabbing behind your thigh or at your knee.  Pull until you feel a gentle stretch in your low back. Hold __________ seconds.  Slowly release your grasp and repeat the exercise with the opposite side. Repeat __________ times. Complete this exercise __________ times per day.  STRETCH - Flexion, Double Knee to Chest  Lie on a firm bed or floor with both legs extended in front of you.  Keeping one leg in contact with the floor, bring your opposite knee to your chest.  Tense your stomach muscles to support your back and then lift your other knee to your chest. Hold your legs   in place by either grabbing behind your thighs or at your knees.  Pull both knees toward your chest until you feel a gentle stretch in your low back. Hold __________ seconds.  Tense your stomach muscles and slowly return one leg at a time to the floor. Repeat __________ times. Complete this exercise __________ times per day.  STRETCH - Low Trunk Rotation  Lie on a firm bed or floor. Keeping your legs in front of you, bend your knees so they are both pointed toward the  ceiling and your feet are flat on the floor.  Extend your arms out to the side. This will stabilize your upper body by keeping your shoulders in contact with the floor.  Gently and slowly drop both knees together to one side until you feel a gentle stretch in your low back. Hold for __________ seconds.  Tense your stomach muscles to support your lower back as you bring your knees back to the starting position. Repeat the exercise to the other side. Repeat __________ times. Complete this exercise __________ times per day  EXTENSION RANGE OF MOTION AND FLEXIBILITY EXERCISES: STRETCH - Extension, Prone on Elbows   Lie on your stomach on the floor, a bed will be too soft. Place your palms about shoulder width apart and at the height of your head.  Place your elbows under your shoulders. If this is too painful, stack pillows under your chest.  Allow your body to relax so that your hips drop lower and make contact more completely with the floor.  Hold this position for __________ seconds.  Slowly return to lying flat on the floor. Repeat __________ times. Complete this exercise __________ times per day.  RANGE OF MOTION - Extension, Prone Press Ups  Lie on your stomach on the floor, a bed will be too soft. Place your palms about shoulder width apart and at the height of your head.  Keeping your back as relaxed as possible, slowly straighten your elbows while keeping your hips on the floor. You may adjust the placement of your hands to maximize your comfort. As you gain motion, your hands will come more underneath your shoulders.  Hold this position __________ seconds.  Slowly return to lying flat on the floor. Repeat __________ times. Complete this exercise __________ times per day.  RANGE OF MOTION- Quadruped, Neutral Spine   Assume a hands and knees position on a firm surface. Keep your hands under your shoulders and your knees under your hips. You may place padding under your knees for  comfort.  Drop your head and point your tailbone toward the ground below you. This will round out your lower back like an angry cat. Hold this position for __________ seconds.  Slowly lift your head and release your tail bone so that your back sags into a large arch, like an old horse.  Hold this position for __________ seconds.  Repeat this until you feel limber in your low back.  Now, find your "sweet spot." This will be the most comfortable position somewhere between the two previous positions. This is your neutral spine. Once you have found this position, tense your stomach muscles to support your low back.  Hold this position for __________ seconds. Repeat __________ times. Complete this exercise __________ times per day.  STRENGTHENING EXERCISES - Low Back Sprain These exercises may help you when beginning to rehabilitate your injury. These exercises should be done near your "sweet spot." This is the neutral, low-back arch, somewhere between fully rounded   and fully arched, that is your least painful position. When performed in this safe range of motion, these exercises can be used for people who have either a flexion or extension based injury. These exercises may resolve your symptoms with or without further involvement from your physician, physical therapist or athletic trainer. While completing these exercises, remember:   Muscles can gain both the endurance and the strength needed for everyday activities through controlled exercises.  Complete these exercises as instructed by your physician, physical therapist or athletic trainer. Increase the resistance and repetitions only as guided.  You may experience muscle soreness or fatigue, but the pain or discomfort you are trying to eliminate should never worsen during these exercises. If this pain does worsen, stop and make certain you are following the directions exactly. If the pain is still present after adjustments, discontinue the  exercise until you can discuss the trouble with your caregiver. STRENGTHENING - Deep Abdominals, Pelvic Tilt   Lie on a firm bed or floor. Keeping your legs in front of you, bend your knees so they are both pointed toward the ceiling and your feet are flat on the floor.  Tense your lower abdominal muscles to press your low back into the floor. This motion will rotate your pelvis so that your tail bone is scooping upwards rather than pointing at your feet or into the floor. With a gentle tension and even breathing, hold this position for __________ seconds. Repeat __________ times. Complete this exercise __________ times per day.  STRENGTHENING - Abdominals, Crunches   Lie on a firm bed or floor. Keeping your legs in front of you, bend your knees so they are both pointed toward the ceiling and your feet are flat on the floor. Cross your arms over your chest.  Slightly tip your chin down without bending your neck.  Tense your abdominals and slowly lift your trunk high enough to just clear your shoulder blades. Lifting higher can put excessive stress on the lower back and does not further strengthen your abdominal muscles.  Control your return to the starting position. Repeat __________ times. Complete this exercise __________ times per day.  STRENGTHENING - Quadruped, Opposite UE/LE Lift   Assume a hands and knees position on a firm surface. Keep your hands under your shoulders and your knees under your hips. You may place padding under your knees for comfort.  Find your neutral spine and gently tense your abdominal muscles so that you can maintain this position. Your shoulders and hips should form a rectangle that is parallel with the floor and is not twisted.  Keeping your trunk steady, lift your right hand no higher than your shoulder and then your left leg no higher than your hip. Make sure you are not holding your breath. Hold this position for __________ seconds.  Continuing to keep  your abdominal muscles tense and your back steady, slowly return to your starting position. Repeat with the opposite arm and leg. Repeat __________ times. Complete this exercise __________ times per day.  STRENGTHENING - Abdominals and Quadriceps, Straight Leg Raise   Lie on a firm bed or floor with both legs extended in front of you.  Keeping one leg in contact with the floor, bend the other knee so that your foot can rest flat on the floor.  Find your neutral spine, and tense your abdominal muscles to maintain your spinal position throughout the exercise.  Slowly lift your straight leg off the floor about 6 inches for a count   of 15, making sure to not hold your breath.  Still keeping your neutral spine, slowly lower your leg all the way to the floor. Repeat this exercise with each leg __________ times. Complete this exercise __________ times per day. POSTURE AND BODY MECHANICS CONSIDERATIONS - Low Back Sprain Keeping correct posture when sitting, standing or completing your activities will reduce the stress put on different body tissues, allowing injured tissues a chance to heal and limiting painful experiences. The following are general guidelines for improved posture. Your physician or physical therapist will provide you with any instructions specific to your needs. While reading these guidelines, remember:  The exercises prescribed by your provider will help you have the flexibility and strength to maintain correct postures.  The correct posture provides the best environment for your joints to work. All of your joints have less wear and tear when properly supported by a spine with good posture. This means you will experience a healthier, less painful body.  Correct posture must be practiced with all of your activities, especially prolonged sitting and standing. Correct posture is as important when doing repetitive low-stress activities (typing) as it is when doing a single heavy-load  activity (lifting). RESTING POSITIONS Consider which positions are most painful for you when choosing a resting position. If you have pain with flexion-based activities (sitting, bending, stooping, squatting), choose a position that allows you to rest in a less flexed posture. You would want to avoid curling into a fetal position on your side. If your pain worsens with extension-based activities (prolonged standing, working overhead), avoid resting in an extended position such as sleeping on your stomach. Most people will find more comfort when they rest with their spine in a more neutral position, neither too rounded nor too arched. Lying on a non-sagging bed on your side with a pillow between your knees, or on your back with a pillow under your knees will often provide some relief. Keep in mind, being in any one position for a prolonged period of time, no matter how correct your posture, can still lead to stiffness. PROPER SITTING POSTURE In order to minimize stress and discomfort on your spine, you must sit with correct posture. Sitting with good posture should be effortless for a healthy body. Returning to good posture is a gradual process. Many people can work toward this most comfortably by using various supports until they have the flexibility and strength to maintain this posture on their own. When sitting with proper posture, your ears will fall over your shoulders and your shoulders will fall over your hips. You should use the back of the chair to support your upper back. Your lower back will be in a neutral position, just slightly arched. You may place a small pillow or folded towel at the base of your lower back for  support.  When working at a desk, create an environment that supports good, upright posture. Without extra support, muscles tire, which leads to excessive strain on joints and other tissues. Keep these recommendations in mind: CHAIR:  A chair should be able to slide under your desk  when your back makes contact with the back of the chair. This allows you to work closely.  The chair's height should allow your eyes to be level with the upper part of your monitor and your hands to be slightly lower than your elbows. BODY POSITION  Your feet should make contact with the floor. If this is not possible, use a foot rest.  Keep your   ears over your shoulders. This will reduce stress on your neck and low back. INCORRECT SITTING POSTURES  If you are feeling tired and unable to assume a healthy sitting posture, do not slouch or slump. This puts excessive strain on your back tissues, causing more damage and pain. Healthier options include:  Using more support, like a lumbar pillow.  Switching tasks to something that requires you to be upright or walking.  Talking a brief walk.  Lying down to rest in a neutral-spine position. PROLONGED STANDING WHILE SLIGHTLY LEANING FORWARD  When completing a task that requires you to lean forward while standing in one place for a long time, place either foot up on a stationary 2-4 inch high object to help maintain the best posture. When both feet are on the ground, the lower back tends to lose its slight inward curve. If this curve flattens (or becomes too large), then the back and your other joints will experience too much stress, tire more quickly, and can cause pain. CORRECT STANDING POSTURES Proper standing posture should be assumed with all daily activities, even if they only take a few moments, like when brushing your teeth. As in sitting, your ears should fall over your shoulders and your shoulders should fall over your hips. You should keep a slight tension in your abdominal muscles to brace your spine. Your tailbone should point down to the ground, not behind your body, resulting in an over-extended swayback posture.  INCORRECT STANDING POSTURES  Common incorrect standing postures include a forward head, locked knees and/or an excessive  swayback. WALKING Walk with an upright posture. Your ears, shoulders and hips should all line-up. PROLONGED ACTIVITY IN A FLEXED POSITION When completing a task that requires you to bend forward at your waist or lean over a low surface, try to find a way to stabilize 3 out of 4 of your limbs. You can place a hand or elbow on your thigh or rest a knee on the surface you are reaching across. This will provide you more stability, so that your muscles do not tire as quickly. By keeping your knees relaxed, or slightly bent, you will also reduce stress across your lower back. CORRECT LIFTING TECHNIQUES DO :  Assume a wide stance. This will provide you more stability and the opportunity to get as close as possible to the object which you are lifting.  Tense your abdominals to brace your spine. Bend at the knees and hips. Keeping your back locked in a neutral-spine position, lift using your leg muscles. Lift with your legs, keeping your back straight.  Test the weight of unknown objects before attempting to lift them.  Try to keep your elbows locked down at your sides in order get the best strength from your shoulders when carrying an object.  Always ask for help when lifting heavy or awkward objects. INCORRECT LIFTING TECHNIQUES DO NOT:   Lock your knees when lifting, even if it is a small object.  Bend and twist. Pivot at your feet or move your feet when needing to change directions.  Assume that you can safely pick up even a paperclip without proper posture. Document Released: 11/10/2005 Document Revised: 02/02/2012 Document Reviewed: 02/22/2009 [employer] Patient Information 2015 [employer]. This information is not intended to replace advice given to you by your health care provider. Make sure you discuss any questions you have with your health care provider.  

## 2015-02-05 ENCOUNTER — Ambulatory Visit: Payer: BC Managed Care – PPO | Admitting: Internal Medicine

## 2015-06-18 ENCOUNTER — Encounter: Payer: BC Managed Care – PPO | Admitting: Family Medicine

## 2015-07-03 ENCOUNTER — Other Ambulatory Visit: Payer: Self-pay

## 2015-07-03 DIAGNOSIS — E039 Hypothyroidism, unspecified: Secondary | ICD-10-CM

## 2015-07-03 DIAGNOSIS — E1142 Type 2 diabetes mellitus with diabetic polyneuropathy: Secondary | ICD-10-CM

## 2015-07-03 MED ORDER — METFORMIN HCL 500 MG PO TABS: 500.0000 mg | ORAL_TABLET | Freq: Every evening | ORAL | Status: DC

## 2015-07-03 MED ORDER — GLIPIZIDE ER 10 MG PO TB24: 10.0000 mg | ORAL_TABLET | Freq: Every day | ORAL | Status: DC

## 2015-07-03 MED ORDER — SITAGLIPTIN PHOSPHATE 100 MG PO TABS: 100.0000 mg | ORAL_TABLET | Freq: Every day | ORAL | Status: DC

## 2015-07-03 MED ORDER — LEVOTHYROXINE SODIUM 100 MCG PO TABS: 100.0000 ug | ORAL_TABLET | Freq: Every day | ORAL | Status: DC

## 2015-07-05 ENCOUNTER — Other Ambulatory Visit: Payer: Self-pay

## 2015-07-05 MED ORDER — GABAPENTIN 800 MG PO TABS: 800.0000 mg | ORAL_TABLET | Freq: Three times a day (TID) | ORAL | Status: DC

## 2015-08-08 ENCOUNTER — Encounter: Payer: BC Managed Care – PPO | Admitting: Family Medicine

## 2015-08-08 ENCOUNTER — Other Ambulatory Visit: Payer: Self-pay

## 2015-08-08 MED ORDER — GABAPENTIN 800 MG PO TABS: 800.0000 mg | ORAL_TABLET | Freq: Three times a day (TID) | ORAL | Status: DC

## 2015-08-08 NOTE — Telephone Encounter (Signed)
Pt has appt 08/10/15.

## 2015-08-10 ENCOUNTER — Ambulatory Visit (INDEPENDENT_AMBULATORY_CARE_PROVIDER_SITE_OTHER): Payer: BC Managed Care – PPO | Admitting: Family Medicine

## 2015-08-10 ENCOUNTER — Encounter: Payer: Self-pay | Admitting: Family Medicine

## 2015-08-10 ENCOUNTER — Encounter: Payer: BC Managed Care – PPO | Admitting: Family Medicine

## 2015-08-10 VITALS — BP 120/83 | HR 83 | Temp 98.6°F | Resp 16 | Ht 64.5 in | Wt 271.0 lb

## 2015-08-10 DIAGNOSIS — E7800 Pure hypercholesterolemia, unspecified: Secondary | ICD-10-CM

## 2015-08-10 DIAGNOSIS — Z0000 Encounter for general adult medical examination without abnormal findings: Secondary | ICD-10-CM

## 2015-08-10 DIAGNOSIS — F329 Major depressive disorder, single episode, unspecified: Secondary | ICD-10-CM

## 2015-08-10 DIAGNOSIS — Z01419 Encounter for gynecological examination (general) (routine) without abnormal findings: Secondary | ICD-10-CM | POA: Diagnosis not present

## 2015-08-10 DIAGNOSIS — E034 Atrophy of thyroid (acquired): Secondary | ICD-10-CM

## 2015-08-10 DIAGNOSIS — F418 Other specified anxiety disorders: Secondary | ICD-10-CM

## 2015-08-10 DIAGNOSIS — J301 Allergic rhinitis due to pollen: Secondary | ICD-10-CM | POA: Diagnosis not present

## 2015-08-10 DIAGNOSIS — M3130 Wegener's granulomatosis without renal involvement: Secondary | ICD-10-CM | POA: Diagnosis not present

## 2015-08-10 DIAGNOSIS — E559 Vitamin D deficiency, unspecified: Secondary | ICD-10-CM

## 2015-08-10 DIAGNOSIS — E1142 Type 2 diabetes mellitus with diabetic polyneuropathy: Secondary | ICD-10-CM

## 2015-08-10 DIAGNOSIS — K2970 Gastritis, unspecified, without bleeding: Secondary | ICD-10-CM | POA: Diagnosis not present

## 2015-08-10 DIAGNOSIS — I10 Essential (primary) hypertension: Secondary | ICD-10-CM | POA: Diagnosis not present

## 2015-08-10 DIAGNOSIS — J302 Other seasonal allergic rhinitis: Secondary | ICD-10-CM

## 2015-08-10 DIAGNOSIS — F419 Anxiety disorder, unspecified: Secondary | ICD-10-CM

## 2015-08-10 DIAGNOSIS — R160 Hepatomegaly, not elsewhere classified: Secondary | ICD-10-CM

## 2015-08-10 DIAGNOSIS — Z23 Encounter for immunization: Secondary | ICD-10-CM | POA: Diagnosis not present

## 2015-08-10 DIAGNOSIS — E038 Other specified hypothyroidism: Secondary | ICD-10-CM

## 2015-08-10 LAB — CBC WITH DIFFERENTIAL/PLATELET
BASOS ABS: 0.1 10*3/uL (ref 0.0–0.1)
BASOS PCT: 1 % (ref 0–1)
EOS ABS: 0.5 10*3/uL (ref 0.0–0.7)
Eosinophils Relative: 4 % (ref 0–5)
HCT: 36 % (ref 36.0–46.0)
Hemoglobin: 11.7 g/dL — ABNORMAL LOW (ref 12.0–15.0)
Lymphocytes Relative: 23 % (ref 12–46)
Lymphs Abs: 2.6 10*3/uL (ref 0.7–4.0)
MCH: 22 pg — ABNORMAL LOW (ref 26.0–34.0)
MCHC: 32.5 g/dL (ref 30.0–36.0)
MCV: 67.5 fL — ABNORMAL LOW (ref 78.0–100.0)
MPV: 8.8 fL (ref 8.6–12.4)
Monocytes Absolute: 1.1 10*3/uL — ABNORMAL HIGH (ref 0.1–1.0)
Monocytes Relative: 10 % (ref 3–12)
NEUTROS PCT: 62 % (ref 43–77)
Neutro Abs: 7 10*3/uL (ref 1.7–7.7)
PLATELETS: 396 10*3/uL (ref 150–400)
RBC: 5.33 MIL/uL — ABNORMAL HIGH (ref 3.87–5.11)
RDW: 17.1 % — AB (ref 11.5–15.5)
WBC: 11.3 10*3/uL — AB (ref 4.0–10.5)

## 2015-08-10 LAB — COMPREHENSIVE METABOLIC PANEL
ALT: 12 U/L (ref 6–29)
AST: 13 U/L (ref 10–35)
Albumin: 4 g/dL (ref 3.6–5.1)
Alkaline Phosphatase: 63 U/L (ref 33–115)
BUN: 25 mg/dL (ref 7–25)
CO2: 26 mmol/L (ref 20–31)
CREATININE: 1.02 mg/dL (ref 0.50–1.10)
Calcium: 9 mg/dL (ref 8.6–10.2)
Chloride: 105 mmol/L (ref 98–110)
GLUCOSE: 75 mg/dL (ref 65–99)
POTASSIUM: 4.3 mmol/L (ref 3.5–5.3)
SODIUM: 137 mmol/L (ref 135–146)
TOTAL PROTEIN: 6.7 g/dL (ref 6.1–8.1)
Total Bilirubin: 0.3 mg/dL (ref 0.2–1.2)

## 2015-08-10 LAB — POCT URINALYSIS DIPSTICK
BILIRUBIN UA: NEGATIVE
Glucose, UA: 500
Ketones, UA: NEGATIVE
LEUKOCYTES UA: NEGATIVE
Nitrite, UA: NEGATIVE
PH UA: 5
Protein, UA: NEGATIVE
Spec Grav, UA: <=1.005
Urobilinogen, UA: 0.2

## 2015-08-10 LAB — LIPID PANEL
CHOLESTEROL: 197 mg/dL (ref 125–200)
HDL: 45 mg/dL — ABNORMAL LOW (ref >=46)
LDL Cholesterol: 116 mg/dL (ref <130)
TRIGLYCERIDES: 181 mg/dL — AB (ref <150)
Total CHOL/HDL Ratio: 4.4 Ratio (ref <=5.0)
VLDL: 36 mg/dL — ABNORMAL HIGH (ref <30)

## 2015-08-10 LAB — T4, FREE: Free T4: 1.11 ng/dL (ref 0.80–1.80)

## 2015-08-10 LAB — POCT GLYCOSYLATED HEMOGLOBIN (HGB A1C): HEMOGLOBIN A1C: 8.4

## 2015-08-10 LAB — VITAMIN B12: VITAMIN B 12: 293 pg/mL (ref 211–911)

## 2015-08-10 LAB — TSH: TSH: 0.898 u[IU]/mL (ref 0.350–4.500)

## 2015-08-10 MED ORDER — INSULIN GLARGINE 100 UNIT/ML SOLOSTAR PEN: 20.0000 [IU] | PEN_INJECTOR | Freq: Every day | SUBCUTANEOUS | Status: DC

## 2015-08-10 MED ORDER — PEN NEEDLES 32G X 4 MM MISC: 1.0000 | Freq: Every day | Status: DC

## 2015-08-10 NOTE — Progress Notes (Signed)
Subjective:    Patient ID: Rachel Walls, female    DOB: 08-14-1966, 49 y.o.   MRN: [id number]  HPI This 49 y.o. female presents for Complete Physical Examination.  Last physical: not sure Pap smear:  2014; LMP 05-2015; moderate flow. Mammogram:  07-04-14  [place] Colonoscopy:  2012 Bone density:  never TDAP:  2012 Pneumovax:  2014; 2015 Influenza:  today Eye exam:  06-09-15. Dental exam:  Every six months.   Review of Systems  Constitutional: Positive for chills and diaphoresis. Negative for fever, activity change, appetite change, fatigue and unexpected weight change.  HENT: Positive for mouth sores and sinus pressure. Negative for congestion, dental problem, drooling, ear discharge, ear pain, facial swelling, hearing loss, nosebleeds, postnasal drip, rhinorrhea, sneezing, sore throat, tinnitus, trouble swallowing and voice change.   Eyes: Positive for photophobia. Negative for pain, discharge, redness, itching and visual disturbance.  Respiratory: Negative.  Negative for apnea, cough, choking, chest tightness, shortness of breath, wheezing and stridor.   Cardiovascular: Negative.  Negative for chest pain, palpitations and leg swelling.  Gastrointestinal: Positive for constipation. Negative for nausea, vomiting, abdominal pain, diarrhea, blood in stool, abdominal distention, anal bleeding and rectal pain.  Endocrine: Positive for heat intolerance. Negative for cold intolerance, polydipsia, polyphagia and polyuria.  Genitourinary: Negative.  Negative for dysuria, urgency, frequency, hematuria, flank pain, decreased urine volume, vaginal bleeding, vaginal discharge, enuresis, difficulty urinating, genital sores, vaginal pain, menstrual problem, pelvic pain and dyspareunia.  Musculoskeletal: Negative.  Negative for myalgias, back pain, joint swelling, arthralgias, gait problem, neck pain and neck stiffness.  Skin: Negative.  Negative for color change, pallor, rash and wound.    Allergic/Immunologic: Positive for environmental allergies. Negative for food allergies and immunocompromised state.  Neurological: Negative.  Negative for dizziness, tremors, seizures, syncope, facial asymmetry, speech difficulty, weakness, light-headedness, numbness and headaches.  Hematological: Negative.  Negative for adenopathy. Does not bruise/bleed easily.  Psychiatric/Behavioral: Negative for suicidal ideas, hallucinations, behavioral problems, confusion, sleep disturbance, self-injury, dysphoric mood, decreased concentration and agitation. The patient is nervous/anxious. The patient is not hyperactive.    Past Medical History  Diagnosis Date  . Wegener's granulomatosis   . GERD (gastroesophageal reflux disease)   . Hypothyroidism   . Diabetes mellitus     Type 2  . Psychic factors associated with disease     Psychogenic factors with other diseases  . Hypertension   . Nonspecific elevation of levels of transaminase or lactic acid dehydrogenase (LDH)   . Other and unspecified hyperlipidemia   . Allergic rhinitis, cause unspecified   . Unspecified vitamin D deficiency   . Obesity, unspecified   . Hypertensive retinopathy   . Venous engorgement of retina   . Chronic kidney disease   . Anemia   . Anxiety   . Asthma   . Arthritis    Past Surgical History  Procedure Laterality Date  . Calcaneous bone spur surgery      x2  . Tonsillectomy    . Renal biopsy  1999  . Lung biopsy  1999  . 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 Abdominal ultrasound  05/17/2012    severe hepatomegaly at 22 cm; gallbladder normal.   [facility].  . Esophagogastroduodenoscopy  08/19/12    diffuse gastritis antrum; pathology negative for malignancy, H. Pylori; esophagus and duodenum normal.  . Ct abdomen  06/10/12    Hepatomegaly.  [facility].  Gery Pray scan  08/27/12    normal; EF 62%.  [facility].  . Meniscus tear      L; x 3 tears.  [facility].   Allergies  Allergen Reactions   . Penicillins Hives  . Sulfonamide Derivatives     Reaction: arthralgias.   Social History   Social History  . Marital Status: Married    Spouse Name: N/A  . Number of Children: 0  . Years of Education: college   Occupational History  . TEACHER     Full time   Social History Main Topics  . Smoking status: Never Smoker   . Smokeless tobacco: Never Used     Comment: Tobacco use-no  . Alcohol Use: No  . Drug Use: No  . Sexual Activity:    Partners: Male   Other Topics Concern  . Not on file   Social History Narrative   Married x 25 years;happily married; no abuse.      Children: none      Lives: with husband, 3 cats, 1 dog.      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      Tobacco: never      Alcohol: never      Drugs: none      Exercise: none      Seatbelt: 100%      Guns: none             Family History  Problem Relation Age of Onset  . Hyperlipidemia Mother     Hypercholesterolemia  . Arthritis Mother     OA  . Sarcoidosis Father   . Heart disease Maternal Grandfather   . Emphysema Maternal Grandfather     Cause of death  . Diabetes Maternal Grandfather   . Heart failure Paternal Grandfather     CHF  . Heart disease Paternal Grandfather   . Asthma Brother   . Alcohol abuse Brother   . Emphysema Maternal Grandmother        Objective:   Physical Exam  Constitutional: She is oriented to person, place, and time. She appears well-developed and well-nourished. No distress.  obese  HENT:  Head: Normocephalic and atraumatic.  Right Ear: External ear normal.  Left Ear: External ear normal.  Nose: Nose normal.  Mouth/Throat: Oropharynx is clear and moist.  Eyes: Conjunctivae and EOM are normal. Pupils are equal, round, and reactive to light.  Neck: Normal range of motion and full passive range of motion without pain. Neck supple. No JVD present. Carotid bruit is not present. No thyromegaly present.   Cardiovascular: Normal rate, regular rhythm and normal heart sounds.  Exam reveals no gallop and no friction rub.   No murmur heard. Pulmonary/Chest: Effort normal and breath sounds normal. She has no wheezes. She has no rales. Right breast exhibits no inverted nipple, no mass, no nipple discharge, no skin change and no tenderness. Left breast exhibits no inverted nipple, no mass, no nipple discharge, no skin change and no tenderness. Breasts are symmetrical.  Abdominal: Soft. Bowel sounds are normal. She exhibits no distension and no mass. There is no tenderness. There is no rebound and no guarding.  Genitourinary: Vagina normal and uterus normal. No labial fusion. There is no rash, tenderness, lesion or injury on the right labia. There is no rash, tenderness, lesion or injury on the left labia. Cervix exhibits no motion tenderness, no discharge and no friability. Right adnexum displays no mass, no tenderness and no fullness. Left adnexum displays no mass,  no tenderness and no fullness.  Musculoskeletal:       Right shoulder: Normal.       Left shoulder: Normal.       Cervical back: Normal.  Lymphadenopathy:    She has no cervical adenopathy.  Neurological: She is alert and oriented to person, place, and time. She has normal reflexes. No cranial nerve deficit. She exhibits normal muscle tone. Coordination normal.  Skin: Skin is warm and dry. No rash noted. She is not diaphoretic. No erythema. No pallor.  Psychiatric: She has a normal mood and affect. Her behavior is normal. Judgment and thought content normal.  Nursing note and vitals reviewed.  INFLUENZA VACCINE ADMINISTERED.     Assessment & Plan:   1. Routine physical examination   2. Need for prophylactic vaccination and inoculation against influenza   3. Wegener's granulomatosis   4. Vitamin D deficiency   5. Hypothyroidism due to acquired atrophy of thyroid   6. Pure hypercholesterolemia   7. Hepatomegaly   8. Gastritis   9.  Essential hypertension, benign   10. Anxiety and depression   11. Allergic rhinitis due to pollen   12. Encounter for routine gynecological examination   13. Type 2 diabetes mellitus with diabetic polyneuropathy     1.  Complete Physical Examination: anticipatory guidance --- exercise, weight loss, ASA 81mg  daily, calcium 3 servings daily.  Pap smear obtained.  Mammogram UTD. Immunizations reviewed; s/p flu vaccine.   2.  Gynecological exam: pap smear obtained; mammogram UTD.  Normal menses; chronic infertility. 3.  Wegeners: stable; followed by rhuematology every six months. 4.  Vitamin D deficiency: uncontrolled; rx for weekly Vitamin D provided. 5.  Hypothyroidism: controlled; refill provided. 6.  Hypercholesterolemia: improved; due to age and DMII, warrants statin; will add at next visit. 7.  Gastritis: stable;continue Prevacid. 8. HTN: controlled; obtain labs; refills provided. 9.  Anxiety and depression: controlled; with Cymbalta. 10.  Allergic Rhinitis: controlled.   11. DMII with neuropathy: uncontrolled with HgbA1c of 8.4; start Lantus 8 units qhs. Education provided during visit on administration.   Orders Placed This Encounter  Procedures  . Flu Vaccine QUAD 36+ mos IM  . CBC with Differential/Platelet  . Comprehensive metabolic panel  . Lipid panel  . TSH  . T4, free  . Microalbumin, urine  . Vitamin B12  . Vit D  25 hydroxy (rtn osteoporosis monitoring)  . POCT urinalysis dipstick  . POCT glycosylated hemoglobin (Hb A1C)  . EKG 12-Lead   Meds ordered this encounter  Medications  . meloxicam (MOBIC) 15 MG tablet    Sig: Take 15 mg by mouth daily.  . Insulin Glargine (LANTUS) 100 UNIT/ML Solostar Pen    Sig: Inject 20 Units into the skin daily at 10 pm.    Dispense:  15 mL    Refill:  11  . Insulin Pen Needle (PEN NEEDLES) 32G X 4 MM MISC    Sig: Inject 1 each into the skin daily. (DX: E11.9)    Dispense:  100 each    Refill:  2  . Vitamin D, Ergocalciferol,  (DRISDOL) 50000 UNITS CAPS capsule    Sig: Take 1 capsule (50,000 Units total) by mouth every 7 (seven) days.    Dispense:  12 capsule    Refill:  0    Cecilie Lowers, M.D. [facility] [address] [place], [place]  [address] [phone] phone [phone] fax

## 2015-08-10 NOTE — Patient Instructions (Signed)

## 2015-08-11 LAB — VITAMIN D 25 HYDROXY (VIT D DEFICIENCY, FRACTURES): VIT D 25 HYDROXY: 14 ng/mL — AB (ref 30–100)

## 2015-08-11 LAB — MICROALBUMIN, URINE: Microalb, Ur: 0.2 mg/dL (ref <2.0)

## 2015-08-14 ENCOUNTER — Other Ambulatory Visit: Payer: Self-pay | Admitting: Family Medicine

## 2015-08-14 LAB — PAP IG AND HPV HIGH-RISK: HPV DNA HIGH RISK: NOT DETECTED

## 2015-08-15 ENCOUNTER — Other Ambulatory Visit: Payer: Self-pay

## 2015-08-15 DIAGNOSIS — E039 Hypothyroidism, unspecified: Secondary | ICD-10-CM

## 2015-08-15 MED ORDER — LEVOTHYROXINE SODIUM 100 MCG PO TABS: 100.0000 ug | ORAL_TABLET | Freq: Every day | ORAL | Status: DC

## 2015-08-17 ENCOUNTER — Encounter: Payer: Self-pay | Admitting: Family Medicine

## 2015-08-17 MED ORDER — VITAMIN D (ERGOCALCIFEROL) 1.25 MG (50000 UNIT) PO CAPS: 50000.0000 [IU] | ORAL_CAPSULE | ORAL | Status: DC

## 2015-08-28 ENCOUNTER — Other Ambulatory Visit: Payer: Self-pay

## 2015-08-28 DIAGNOSIS — E1142 Type 2 diabetes mellitus with diabetic polyneuropathy: Secondary | ICD-10-CM

## 2015-08-28 MED ORDER — SITAGLIPTIN PHOSPHATE 100 MG PO TABS: 100.0000 mg | ORAL_TABLET | Freq: Every day | ORAL | Status: DC

## 2015-08-28 MED ORDER — METFORMIN HCL 500 MG PO TABS: 500.0000 mg | ORAL_TABLET | Freq: Every evening | ORAL | Status: DC

## 2015-08-31 ENCOUNTER — Other Ambulatory Visit: Payer: Self-pay

## 2015-08-31 DIAGNOSIS — I10 Essential (primary) hypertension: Secondary | ICD-10-CM

## 2015-08-31 MED ORDER — RAMIPRIL 5 MG PO CAPS: 5.0000 mg | ORAL_CAPSULE | Freq: Every day | ORAL | Status: DC

## 2015-09-07 ENCOUNTER — Telehealth: Payer: Self-pay | Admitting: Family Medicine

## 2015-09-07 NOTE — Telephone Encounter (Signed)
Fasting sugars 160-210.  Lantus 8 units daily.  A/P: DMII uncontrolled; increase Lantus to 16 units qhs.

## 2015-09-16 MED ORDER — FLUTICASONE PROPIONATE 50 MCG/ACT NA SUSP: 2.0000 | Freq: Every day | NASAL | Status: DC

## 2015-09-16 MED ORDER — LANSOPRAZOLE 30 MG PO CPDR: 30.0000 mg | DELAYED_RELEASE_CAPSULE | Freq: Every day | ORAL | Status: DC

## 2015-09-16 MED ORDER — GLIPIZIDE ER 10 MG PO TB24: 10.0000 mg | ORAL_TABLET | Freq: Every day | ORAL | Status: DC

## 2015-09-16 MED ORDER — GABAPENTIN 800 MG PO TABS: 800.0000 mg | ORAL_TABLET | Freq: Three times a day (TID) | ORAL | Status: DC

## 2015-09-16 MED ORDER — CANAGLIFLOZIN 300 MG PO TABS: 300.0000 mg | ORAL_TABLET | Freq: Every day | ORAL | Status: DC

## 2015-09-16 MED ORDER — DULOXETINE HCL 60 MG PO CPEP: 60.0000 mg | ORAL_CAPSULE | Freq: Two times a day (BID) | ORAL | Status: DC

## 2015-11-09 ENCOUNTER — Encounter: Payer: Self-pay | Admitting: Family Medicine

## 2015-11-09 ENCOUNTER — Ambulatory Visit (INDEPENDENT_AMBULATORY_CARE_PROVIDER_SITE_OTHER): Payer: BC Managed Care – PPO | Admitting: Family Medicine

## 2015-11-09 VITALS — BP 125/84 | HR 84 | Temp 97.1°F | Resp 16 | Ht 64.75 in | Wt 270.8 lb

## 2015-11-09 DIAGNOSIS — E7800 Pure hypercholesterolemia, unspecified: Secondary | ICD-10-CM | POA: Diagnosis not present

## 2015-11-09 DIAGNOSIS — F418 Other specified anxiety disorders: Secondary | ICD-10-CM

## 2015-11-09 DIAGNOSIS — E1142 Type 2 diabetes mellitus with diabetic polyneuropathy: Secondary | ICD-10-CM

## 2015-11-09 DIAGNOSIS — Z114 Encounter for screening for human immunodeficiency virus [HIV]: Secondary | ICD-10-CM

## 2015-11-09 DIAGNOSIS — F419 Anxiety disorder, unspecified: Secondary | ICD-10-CM

## 2015-11-09 DIAGNOSIS — Z794 Long term (current) use of insulin: Secondary | ICD-10-CM | POA: Diagnosis not present

## 2015-11-09 DIAGNOSIS — E559 Vitamin D deficiency, unspecified: Secondary | ICD-10-CM | POA: Diagnosis not present

## 2015-11-09 DIAGNOSIS — I10 Essential (primary) hypertension: Secondary | ICD-10-CM

## 2015-11-09 DIAGNOSIS — D509 Iron deficiency anemia, unspecified: Secondary | ICD-10-CM

## 2015-11-09 DIAGNOSIS — F32A Depression, unspecified: Secondary | ICD-10-CM

## 2015-11-09 DIAGNOSIS — E539 Vitamin B deficiency, unspecified: Secondary | ICD-10-CM

## 2015-11-09 DIAGNOSIS — E611 Iron deficiency: Secondary | ICD-10-CM

## 2015-11-09 DIAGNOSIS — M3130 Wegener's granulomatosis without renal involvement: Secondary | ICD-10-CM | POA: Diagnosis not present

## 2015-11-09 DIAGNOSIS — F329 Major depressive disorder, single episode, unspecified: Secondary | ICD-10-CM

## 2015-11-09 LAB — COMPREHENSIVE METABOLIC PANEL
ALT: 13 U/L (ref 6–29)
AST: 12 U/L (ref 10–35)
Albumin: 3.9 g/dL (ref 3.6–5.1)
Alkaline Phosphatase: 76 U/L (ref 33–115)
BILIRUBIN TOTAL: 0.3 mg/dL (ref 0.2–1.2)
BUN: 26 mg/dL — AB (ref 7–25)
CHLORIDE: 102 mmol/L (ref 98–110)
CO2: 25 mmol/L (ref 20–31)
CREATININE: 0.9 mg/dL (ref 0.50–1.10)
Calcium: 9.1 mg/dL (ref 8.6–10.2)
GLUCOSE: 189 mg/dL — AB (ref 65–99)
Potassium: 4.4 mmol/L (ref 3.5–5.3)
SODIUM: 136 mmol/L (ref 135–146)
Total Protein: 7.1 g/dL (ref 6.1–8.1)

## 2015-11-09 LAB — CBC WITH DIFFERENTIAL/PLATELET
BASOS PCT: 0 % (ref 0–1)
Basophils Absolute: 0 10*3/uL (ref 0.0–0.1)
EOS ABS: 0.3 10*3/uL (ref 0.0–0.7)
Eosinophils Relative: 3 % (ref 0–5)
HCT: 36.9 % (ref 36.0–46.0)
HEMOGLOBIN: 11.6 g/dL — AB (ref 12.0–15.0)
Lymphocytes Relative: 22 % (ref 12–46)
Lymphs Abs: 2.2 10*3/uL (ref 0.7–4.0)
MCH: 21.6 pg — AB (ref 26.0–34.0)
MCHC: 31.4 g/dL (ref 30.0–36.0)
MCV: 68.7 fL — ABNORMAL LOW (ref 78.0–100.0)
MONO ABS: 0.8 10*3/uL (ref 0.1–1.0)
MONOS PCT: 8 % (ref 3–12)
MPV: 8.9 fL (ref 8.6–12.4)
NEUTROS ABS: 6.7 10*3/uL (ref 1.7–7.7)
Neutrophils Relative %: 67 % (ref 43–77)
PLATELETS: 452 10*3/uL — AB (ref 150–400)
RBC: 5.37 MIL/uL — AB (ref 3.87–5.11)
RDW: 16.9 % — AB (ref 11.5–15.5)
WBC: 10 10*3/uL (ref 4.0–10.5)

## 2015-11-09 LAB — GLUCOSE, POCT (MANUAL RESULT ENTRY): POC GLUCOSE: 197 mg/dL — AB (ref 70–99)

## 2015-11-09 LAB — IRON: Iron: 31 ug/dL — ABNORMAL LOW (ref 40–190)

## 2015-11-09 LAB — VITAMIN B12: Vitamin B-12: 335 pg/mL (ref 211–911)

## 2015-11-09 LAB — POCT GLYCOSYLATED HEMOGLOBIN (HGB A1C): HEMOGLOBIN A1C: 7.5

## 2015-11-09 MED ORDER — INSULIN GLARGINE 100 UNIT/ML SOLOSTAR PEN: 22.0000 [IU] | PEN_INJECTOR | Freq: Every day | SUBCUTANEOUS | Status: DC

## 2015-11-09 NOTE — Progress Notes (Signed)
Subjective:    Patient ID: Rachel Walls, female    DOB: 1966-03-03, 49 y.o.   MRN: [id number]  11/09/2015  Follow-up   HPI  1. DMII: increased Lantus to 16 units qhs.  Later the Lantus administered, the better the sugars.  Fasting sugars running 160-137.  No postprandial sugars checked. No side effects.   Has been taking 16 units for two months.  Invokana is free for patient; for next year. Januvia is $40/month.   2.  Recurrent falls: this time 10/29/15, book bag was in the way at school.  In the past, had children in tables. Then placed desks in rows.  Passing out papers and foot hit book bag; tied to catch self on desk which is attached to the chair. Hit R side of ribs and landed on L knee, L elbow, L shoulder.  Workman's compensatioin injury; out of work for ten days.  S/p xrays and negative.   Has fallen three times; fell in 03/2015, 06/2015, 10/2015.  Bone contusion of L knee.  Can take six weeks for healing of contusion; really tight.  Used crutches for one week.  Then refers to ortho; took several days.  Initially, L knee and L elbow. Did not mention L shoulder.   In May, doing EOGs and children at school and walking up stairs with children.  Walking to lunch; shaky at the time; thinks due to hypoglycemia. Landed on L knee.  Workman's compensation evaluation.  In August, went back to school and came around the corner and going around custodial area and slipped on wax.    3.  Perimenopausal: had old bleeding after fall.  Some hot flashes but not a lot.   4. Hypercholesterolemia: Patient reports good compliance with medication, good tolerance to medication, and good symptom control.    5. HTN: Patient reports good compliance with medication, good tolerance to medication, and good symptom control.    Review of Systems  Constitutional: Negative for fever, chills, diaphoresis and fatigue.  Eyes: Negative for visual disturbance.  Respiratory: Negative for cough and shortness of breath.    Cardiovascular: Negative for chest pain, palpitations and leg swelling.  Gastrointestinal: Negative for nausea, vomiting, abdominal pain, diarrhea and constipation.  Endocrine: Negative for cold intolerance, heat intolerance, polydipsia, polyphagia and polyuria.  Neurological: Negative for dizziness, tremors, seizures, syncope, facial asymmetry, speech difficulty, weakness, light-headedness, numbness and headaches.    Past Medical History  Diagnosis Date  . Wegener's granulomatosis (HCC)   . GERD (gastroesophageal reflux disease)   . Hypothyroidism   . Diabetes mellitus     Type 2  . Psychic factors associated with disease     Psychogenic factors with other diseases  . Hypertension   . Nonspecific elevation of levels of transaminase or lactic acid dehydrogenase (LDH)   . Other and unspecified hyperlipidemia   . Allergic rhinitis, cause unspecified   . Unspecified vitamin D deficiency   . Obesity, unspecified   . Hypertensive retinopathy   . Venous engorgement of retina   . Chronic kidney disease   . Anemia   . Anxiety   . Asthma   . Arthritis    Past Surgical History  Procedure Laterality Date  . Calcaneous bone spur surgery      x2  . Tonsillectomy    . Renal biopsy  1999  . Lung biopsy  1999  . 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 Abdominal ultrasound  05/17/2012  severe hepatomegaly at 22 cm; gallbladder normal.   [facility].  . Esophagogastroduodenoscopy  08/19/12    diffuse gastritis antrum; pathology negative for malignancy, H. Pylori; esophagus and duodenum normal.  . Ct abdomen  06/10/12    Hepatomegaly.  [facility].  Gery Pray. [place] scan  08/27/12    normal; EF 62%.  [facility].  . Meniscus tear      L; x 3 tears.  [facility].   Allergies  Allergen Reactions  . Penicillins Hives  . Sulfonamide Derivatives     Reaction: arthralgias.    Social History   Social History  . Marital Status: Married    Spouse Name: N/A  . Number of Children: 0   . Years of Education: college   Occupational History  . TEACHER     Full time   Social History Main Topics  . Smoking status: Never Smoker   . Smokeless tobacco: Never Used     Comment: Tobacco use-no  . Alcohol Use: No  . Drug Use: No  . Sexual Activity:    Partners: Male   Other Topics Concern  . Not on file   Social History Narrative   Married x 25 years;happily married; no abuse.      Children: none      Lives: with husband, 3 cats, 1 dog.      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      Tobacco: never      Alcohol: never      Drugs: none      Exercise: none      Seatbelt: 100%      Guns: none             Family History  Problem Relation Age of Onset  . Hyperlipidemia Mother     Hypercholesterolemia  . Arthritis Mother     OA  . Sarcoidosis Father   . Heart disease Maternal Grandfather   . Emphysema Maternal Grandfather     Cause of death  . Diabetes Maternal Grandfather   . Heart failure Paternal Grandfather     CHF  . Heart disease Paternal Grandfather   . Asthma Brother   . Alcohol abuse Brother   . Emphysema Maternal Grandmother        Objective:    BP 125/84 mmHg  Pulse 84  Temp(Src) 97.1 F (36.2 C) (Oral)  Resp 16  Ht 5' 4.75" (1.645 m)  Wt 270 lb 12.8 oz (122.834 kg)  BMI 45.39 kg/m2  SpO2 98%  LMP 02/23/2015 Physical Exam  Constitutional: She is oriented to person, place, and time. She appears well-developed and well-nourished. No distress.  HENT:  Head: Normocephalic and atraumatic.  Right Ear: External ear normal.  Left Ear: External ear normal.  Nose: Nose normal.  Mouth/Throat: Oropharynx is clear and moist.  Eyes: Conjunctivae and EOM are normal. Pupils are equal, round, and reactive to light.  Neck: Normal range of motion. Neck supple. Carotid bruit is not present. No thyromegaly present.  Cardiovascular: Normal rate, regular rhythm, normal heart sounds and intact distal  pulses.  Exam reveals no gallop and no friction rub.   No murmur heard. Pulmonary/Chest: Effort normal and breath sounds normal. She has no wheezes. She has no rales.  Abdominal: Soft. Bowel sounds are normal. She exhibits no distension and no mass. There is no tenderness. There is no rebound and no guarding.  Lymphadenopathy:    She has no  cervical adenopathy.  Neurological: She is alert and oriented to person, place, and time. No cranial nerve deficit.  Skin: Skin is warm and dry. No rash noted. She is not diaphoretic. No erythema. No pallor.  Psychiatric: She has a normal mood and affect. Her behavior is normal.        Assessment & Plan:   1. Type 2 diabetes mellitus with diabetic polyneuropathy, with long-term current use of insulin (HCC)   2. Pure hypercholesterolemia   3. Essential hypertension, benign   4. Anxiety and depression   5. Wegener's granulomatosis (HCC)   6. Vitamin D deficiency   7. Vitamin B deficiency   8. Low iron   9. Screening for HIV (human immunodeficiency virus)     Orders Placed This Encounter  Procedures  . HIV antibody  . CBC with Differential/Platelet  . Comprehensive metabolic panel  . Vitamin B12  . VITAMIN D 25 Hydroxy (Vit-D Deficiency, Fractures)  . Iron  . POCT glucose (manual entry)  . POCT glycosylated hemoglobin (Hb A1C)   Meds ordered this encounter  Medications  . Insulin Glargine (LANTUS) 100 UNIT/ML Solostar Pen    Sig: Inject 22 Units into the skin daily at 10 pm.    Dispense:  15 mL    Refill:  11    Return in about 3 months (around 02/07/2016).    Karanvir Balderston Paulita Fujita, M.D. [facility] [address] [place], [place]  [address] [phone] phone [phone] fax

## 2015-11-09 NOTE — Patient Instructions (Signed)
1. Increase Lantus to 22 units nightly. 2. STOP Januvia. 3.  Email me in one month with sugar readings.  We will likely need to increase Lantus dose.

## 2015-11-10 LAB — HIV ANTIBODY (ROUTINE TESTING W REFLEX): HIV 1&2 Ab, 4th Generation: NONREACTIVE

## 2015-11-10 LAB — VITAMIN D 25 HYDROXY (VIT D DEFICIENCY, FRACTURES): VIT D 25 HYDROXY: 21 ng/mL — AB (ref 30–100)

## 2015-11-28 ENCOUNTER — Telehealth: Payer: Self-pay

## 2015-11-28 MED ORDER — INSULIN DETEMIR 100 UNIT/ML FLEXPEN: 22.0000 [IU] | PEN_INJECTOR | Freq: Every day | SUBCUTANEOUS | Status: DC

## 2015-11-28 NOTE — Telephone Encounter (Signed)
Lantus is no longer preferred on pt's ins plan. They prefer Levemir, or Novolog, novolog 70/30,Basaglar, Novolin R, Humulin R, or novolin N. I started a PA form on covermymeds, but it asked if pt has tried/failed preferred meds or has a contraindication to use them. I don't see this info in EPIC. Dr Katrinka BlazingSmith, do want to change the med, or let me know what to tell the insurance plan as to why pt needs Lantus instead of these other insulins.

## 2015-11-28 NOTE — Telephone Encounter (Signed)
I sent in Levemir insulin to replace Lantus.  Pt should administer the same amount of Levemir as Lantus (22 units).

## 2015-11-29 NOTE — Telephone Encounter (Signed)
LMOM w/instr's on both H and C #s, and asked for CB w/any questions, or if Levemir is not controlling BS as well as the Lantus.

## 2015-12-10 ENCOUNTER — Other Ambulatory Visit: Payer: Self-pay

## 2015-12-10 DIAGNOSIS — E1142 Type 2 diabetes mellitus with diabetic polyneuropathy: Secondary | ICD-10-CM

## 2015-12-10 MED ORDER — METFORMIN HCL 500 MG PO TABS: 500.0000 mg | ORAL_TABLET | Freq: Every evening | ORAL | Status: DC

## 2015-12-14 ENCOUNTER — Telehealth: Payer: Self-pay

## 2015-12-14 NOTE — Telephone Encounter (Signed)
PA is needed for Invokana. It looks like the new ins plan prefers [place] or [place], which I don't believe the pt has tried. Dr Katrinka Blazing, do you want to switch pt to one of these? If not, I can try the PA (please give me a reason pt can not be changed to put on the form), but prob won't be approved.

## 2015-12-18 MED ORDER — EMPAGLIFLOZIN 25 MG PO TABS: 25.0000 mg | ORAL_TABLET | Freq: Every day | ORAL | Status: DC

## 2015-12-18 NOTE — Telephone Encounter (Signed)
Called and LM for pt advising of change to Jardiance d/t ins coverage.

## 2015-12-18 NOTE — Telephone Encounter (Signed)
I prescribed Jardiance  daily to replace Invokana.  Please advise patient of this change.

## 2016-01-18 ENCOUNTER — Other Ambulatory Visit: Payer: Self-pay | Admitting: Family Medicine

## 2016-02-08 ENCOUNTER — Ambulatory Visit: Payer: BC Managed Care – PPO | Admitting: Family Medicine

## 2016-02-11 ENCOUNTER — Encounter: Payer: Self-pay | Admitting: Family Medicine

## 2016-02-11 DIAGNOSIS — I10 Essential (primary) hypertension: Secondary | ICD-10-CM

## 2016-02-11 DIAGNOSIS — E1142 Type 2 diabetes mellitus with diabetic polyneuropathy: Secondary | ICD-10-CM

## 2016-02-12 MED ORDER — RAMIPRIL 5 MG PO CAPS: 5.0000 mg | ORAL_CAPSULE | Freq: Every day | ORAL | Status: DC

## 2016-02-12 MED ORDER — METFORMIN HCL 500 MG PO TABS: 500.0000 mg | ORAL_TABLET | Freq: Every evening | ORAL | Status: DC

## 2016-02-19 ENCOUNTER — Encounter: Payer: Self-pay | Admitting: Family Medicine

## 2016-02-19 ENCOUNTER — Ambulatory Visit (INDEPENDENT_AMBULATORY_CARE_PROVIDER_SITE_OTHER): Payer: BC Managed Care – PPO | Admitting: Family Medicine

## 2016-02-19 ENCOUNTER — Ambulatory Visit (INDEPENDENT_AMBULATORY_CARE_PROVIDER_SITE_OTHER): Payer: BC Managed Care – PPO

## 2016-02-19 VITALS — BP 118/81 | HR 105 | Temp 98.7°F | Resp 16 | Ht 64.5 in | Wt 276.0 lb

## 2016-02-19 DIAGNOSIS — M5431 Sciatica, right side: Secondary | ICD-10-CM | POA: Diagnosis not present

## 2016-02-19 DIAGNOSIS — M5432 Sciatica, left side: Secondary | ICD-10-CM | POA: Diagnosis not present

## 2016-02-19 DIAGNOSIS — R160 Hepatomegaly, not elsewhere classified: Secondary | ICD-10-CM

## 2016-02-19 DIAGNOSIS — D509 Iron deficiency anemia, unspecified: Secondary | ICD-10-CM

## 2016-02-19 DIAGNOSIS — J0101 Acute recurrent maxillary sinusitis: Secondary | ICD-10-CM

## 2016-02-19 DIAGNOSIS — E611 Iron deficiency: Secondary | ICD-10-CM

## 2016-02-19 DIAGNOSIS — E559 Vitamin D deficiency, unspecified: Secondary | ICD-10-CM

## 2016-02-19 DIAGNOSIS — E1142 Type 2 diabetes mellitus with diabetic polyneuropathy: Secondary | ICD-10-CM | POA: Diagnosis not present

## 2016-02-19 DIAGNOSIS — E7800 Pure hypercholesterolemia, unspecified: Secondary | ICD-10-CM

## 2016-02-19 DIAGNOSIS — E034 Atrophy of thyroid (acquired): Secondary | ICD-10-CM

## 2016-02-19 DIAGNOSIS — F329 Major depressive disorder, single episode, unspecified: Secondary | ICD-10-CM

## 2016-02-19 DIAGNOSIS — F419 Anxiety disorder, unspecified: Secondary | ICD-10-CM

## 2016-02-19 DIAGNOSIS — M3130 Wegener's granulomatosis without renal involvement: Secondary | ICD-10-CM | POA: Diagnosis not present

## 2016-02-19 DIAGNOSIS — I10 Essential (primary) hypertension: Secondary | ICD-10-CM | POA: Diagnosis not present

## 2016-02-19 DIAGNOSIS — E038 Other specified hypothyroidism: Secondary | ICD-10-CM | POA: Diagnosis not present

## 2016-02-19 DIAGNOSIS — F418 Other specified anxiety disorders: Secondary | ICD-10-CM

## 2016-02-19 LAB — CBC WITH DIFFERENTIAL/PLATELET
BASOS ABS: 0 10*3/uL (ref 0.0–0.1)
Basophils Relative: 0 % (ref 0–1)
Eosinophils Absolute: 0.3 10*3/uL (ref 0.0–0.7)
Eosinophils Relative: 3 % (ref 0–5)
HEMATOCRIT: 36.6 % (ref 36.0–46.0)
Hemoglobin: 11.5 g/dL — ABNORMAL LOW (ref 12.0–15.0)
LYMPHS ABS: 2 10*3/uL (ref 0.7–4.0)
LYMPHS PCT: 18 % (ref 12–46)
MCH: 21.9 pg — ABNORMAL LOW (ref 26.0–34.0)
MCHC: 31.4 g/dL (ref 30.0–36.0)
MCV: 69.6 fL — AB (ref 78.0–100.0)
MPV: 9.2 fL (ref 8.6–12.4)
Monocytes Absolute: 1 10*3/uL (ref 0.1–1.0)
Monocytes Relative: 9 % (ref 3–12)
NEUTROS ABS: 7.8 10*3/uL — AB (ref 1.7–7.7)
NEUTROS PCT: 70 % (ref 43–77)
PLATELETS: 379 10*3/uL (ref 150–400)
RBC: 5.26 MIL/uL — ABNORMAL HIGH (ref 3.87–5.11)
RDW: 17 % — ABNORMAL HIGH (ref 11.5–15.5)
WBC: 11.2 10*3/uL — ABNORMAL HIGH (ref 4.0–10.5)

## 2016-02-19 LAB — GLUCOSE, POCT (MANUAL RESULT ENTRY): POC Glucose: 237 mg/dl — AB (ref 70–99)

## 2016-02-19 LAB — POCT GLYCOSYLATED HEMOGLOBIN (HGB A1C): Hemoglobin A1C: 7.6

## 2016-02-19 MED ORDER — INSULIN GLARGINE 100 UNIT/ML SOLOSTAR PEN: 26.0000 [IU] | PEN_INJECTOR | Freq: Every day | SUBCUTANEOUS | Status: DC

## 2016-02-19 MED ORDER — ATORVASTATIN CALCIUM 10 MG PO TABS: 10.0000 mg | ORAL_TABLET | Freq: Every day | ORAL | Status: DC

## 2016-02-19 MED ORDER — LEVOFLOXACIN 750 MG PO TABS: 750.0000 mg | ORAL_TABLET | Freq: Every day | ORAL | Status: DC

## 2016-02-19 NOTE — Patient Instructions (Addendum)
     IF you received an x-ray today, [facility] will receive an invoice from [facility]. Please contact [facility] at [phone] with questions or concerns regarding your invoice.   IF you received labwork today, you will receive an invoice from [facility]. Please contact [facility] at [phone] with questions or concerns regarding your invoice.   Our billing staff will not be able to assist you with questions regarding bills from these companies.  You will be contacted with the lab results as soon as they are available. The fastest way to get your results is to activate your My Chart account. Instructions are located on the last page of this paperwork. If you have not heard from us regarding the results in 2 weeks, please contact this office.    [email or web address]  START ASPIRIN 81MG  ONE TABLET DAILY.

## 2016-02-19 NOTE — Progress Notes (Signed)
Subjective:    Patient ID: Rachel Walls, female    DOB: 1966-06-15, 50 y.o.   MRN: [id number]  02/19/2016  Follow-up and Medication Refill   HPI This 50 y.o. female presents for three month follow-up:   1. DMII:  Started Levemir at last visit.  Using 22 units of Lantus.  Has not changed to Levemir yet.   Jardiance replaced Invokana per insurance. Glucotrol ER  daily. Fasting sugars running 120-140s.  Now running 170-190s.    2. Sinusitis: took Levaquin that was left over; still having head congestion; headache; less dark nasal congestion.  Having sinus pressure; during sinusitis, sugars higher.  Have not decreased to baseline.  3.  Arthralgias:  Everything is more achy. Pain from R buttocks B and runs down into anterior knees and other days in the back of thighs.  Helped a little.  Has gone to [facility]; prescribed meds that were not effective.  On feet all the time.  Sitting down works; if standing with flexed knees.  Was constant; now in morning. Has been living on Advil which is not a great thing.   at a time.  Thought was sciatica yet confusing because B location.  Onset in past two months; last fall 06/2015 and 10/2015.    4.  Obesity: cutting back; only drinking water.  Not losing weight.  Now, husband is only 100 pounds away from catching patient.  Husband is not exercising; pt is not exercising.  Needs some ideas.  Not eating a lot of carbs; eating high protein; cannot eat whole wheat; avoiding bread. B: protein bar, PB, bananas, water. Snack: nothing Lunch: PB sandwich or cheese and crackers, fruit/peaches, water. Snack: granola bar, water Supper: pork chop, mac cheese, water, salad Snack:  Banana and PB or water/ice or orange     Review of Systems  Constitutional: Negative for fever, chills, diaphoresis and fatigue.  Eyes: Negative for visual disturbance.  Respiratory: Negative for cough and shortness of breath.   Cardiovascular: Negative for  chest pain, palpitations and leg swelling.  Gastrointestinal: Negative for nausea, vomiting, abdominal pain, diarrhea and constipation.  Endocrine: Negative for cold intolerance, heat intolerance, polydipsia, polyphagia and polyuria.  Neurological: Negative for dizziness, tremors, seizures, syncope, facial asymmetry, speech difficulty, weakness, light-headedness, numbness and headaches.    Past Medical History  Diagnosis Date  . Wegener's granulomatosis (HCC)   . GERD (gastroesophageal reflux disease)   . Hypothyroidism   . Diabetes mellitus     Type 2  . Psychic factors associated with disease     Psychogenic factors with other diseases  . Hypertension   . Nonspecific elevation of levels of transaminase or lactic acid dehydrogenase (LDH)   . Other and unspecified hyperlipidemia   . Allergic rhinitis, cause unspecified   . Unspecified vitamin D deficiency   . Obesity, unspecified   . Hypertensive retinopathy   . Venous engorgement of retina   . Chronic kidney disease   . Anemia   . Anxiety   . Asthma   . Arthritis    Past Surgical History  Procedure Laterality Date  . Calcaneous bone spur surgery      x2  . Tonsillectomy    . Renal biopsy  1999  . Lung biopsy  1999  . 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 Abdominal ultrasound  05/17/2012    severe hepatomegaly at 22 cm; gallbladder normal.   [facility].  . Esophagogastroduodenoscopy  08/19/12  diffuse gastritis antrum; pathology negative for malignancy, H. Pylori; esophagus and duodenum normal.  . Ct abdomen  06/10/12    Hepatomegaly.  [facility].  Gery Pray scan  08/27/12    normal; EF 62%.  [facility].  . Meniscus tear      L; x 3 tears.  [facility].   Allergies  Allergen Reactions  . Penicillins Hives  . Sulfonamide Derivatives     Reaction: arthralgias.    Social History   Social History  . Marital Status: Married    Spouse Name: N/A  . Number of Children: 0  . Years of Education:  college   Occupational History  . TEACHER     Full time   Social History Main Topics  . Smoking status: Never Smoker   . Smokeless tobacco: Never Used     Comment: Tobacco use-no  . Alcohol Use: No  . Drug Use: No  . Sexual Activity:    Partners: Male   Other Topics Concern  . Not on file   Social History Narrative   Married x 25 years;happily married; no abuse.      Children: none      Lives: with husband, 3 cats, 1 dog.      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      Tobacco: never      Alcohol: never      Drugs: none      Exercise: none      Seatbelt: 100%      Guns: none             Family History  Problem Relation Age of Onset  . Hyperlipidemia Mother     Hypercholesterolemia  . Arthritis Mother     OA  . Sarcoidosis Father   . Heart disease Maternal Grandfather   . Emphysema Maternal Grandfather     Cause of death  . Diabetes Maternal Grandfather   . Heart failure Paternal Grandfather     CHF  . Heart disease Paternal Grandfather   . Asthma Brother   . Alcohol abuse Brother   . Emphysema Maternal Grandmother        Objective:    BP 118/81 mmHg  Pulse 105  Temp(Src) 98.7 F (37.1 C) (Oral)  Resp 16  Ht 5' 4.5" (1.638 m)  Wt 276 lb (125.193 kg)  BMI 46.66 kg/m2 Physical Exam  Constitutional: She is oriented to person, place, and time. She appears well-developed and well-nourished. No distress.  HENT:  Head: Normocephalic and atraumatic.  Right Ear: External ear normal.  Left Ear: External ear normal.  Nose: Nose normal.  Mouth/Throat: Oropharynx is clear and moist.  Eyes: Conjunctivae and EOM are normal. Pupils are equal, round, and reactive to light.  Neck: Normal range of motion. Neck supple. Carotid bruit is not present. No thyromegaly present.  Cardiovascular: Normal rate, regular rhythm, normal heart sounds and intact distal pulses.  Exam reveals no gallop and no friction rub.   No  murmur heard. Pulmonary/Chest: Effort normal and breath sounds normal. She has no wheezes. She has no rales.  Abdominal: Soft. Bowel sounds are normal. She exhibits no distension and no mass. There is no tenderness. There is no rebound and no guarding.  Lymphadenopathy:    She has no cervical adenopathy.  Neurological: She is alert and oriented to person, place, and time. No cranial nerve deficit.  Skin: Skin is warm and dry. No  rash noted. She is not diaphoretic. No erythema. No pallor.  Psychiatric: She has a normal mood and affect. Her behavior is normal.        Assessment & Plan:   1. Diabetic peripheral neuropathy (HCC)   2. Anxiety and depression   3. Wegener's granulomatosis (HCC)   4. Vitamin D deficiency   5. Hypothyroidism due to acquired atrophy of thyroid   6. Essential hypertension, benign   7. Hepatomegaly   8. Pure hypercholesterolemia   9. Low iron   10. Bilateral sciatica   11. Acute recurrent maxillary sinusitis     Orders Placed This Encounter  Procedures  . DG Lumbar Spine Complete    Standing Status: Future     Number of Occurrences: 1     Standing Expiration Date: 02/18/2017    Order Specific Question:  Reason for Exam (SYMPTOM  OR DIAGNOSIS REQUIRED)    Answer:  low back pain with radiation into B legs    Order Specific Question:  Is the patient pregnant?    Answer:  No    Order Specific Question:  Preferred imaging location?    Answer:  External  . CBC with Differential/Platelet  . Comprehensive metabolic panel    Order Specific Question:  Has the patient fasted?    Answer:  Yes  . Lipid panel    Order Specific Question:  Has the patient fasted?    Answer:  Yes  . Iron  . VITAMIN D 25 Hydroxy (Vit-D Deficiency, Fractures)  . POCT glucose (manual entry)  . POCT glycosylated hemoglobin (Hb A1C)   Meds ordered this encounter  Medications  . levofloxacin (LEVAQUIN) 750 MG tablet    Sig: Take 1 tablet (750 mg total) by mouth daily.    Dispense:   7 tablet    Refill:  0  . atorvastatin (LIPITOR) 10 MG tablet    Sig: Take 1 tablet (10 mg total) by mouth daily.    Dispense:  90 tablet    Refill:  1  . Insulin Glargine (LANTUS) 100 UNIT/ML Solostar Pen    Sig: Inject 26 Units into the skin daily at 10 pm.    Dispense:  15 mL    Refill:  11    Return in about 4 months (around 06/20/2016) for recheck.    Kristi Paulita FujitaMartin Smith, M.D. [facility]  [place] [address] [place], [place]  [id number] [phone] phone [phone] fax

## 2016-02-20 ENCOUNTER — Telehealth: Payer: Self-pay

## 2016-02-20 LAB — IRON: Iron: 22 ug/dL — ABNORMAL LOW (ref 45–160)

## 2016-02-20 LAB — COMPREHENSIVE METABOLIC PANEL
ALK PHOS: 91 U/L (ref 33–130)
ALT: 11 U/L (ref 6–29)
AST: 12 U/L (ref 10–35)
Albumin: 4 g/dL (ref 3.6–5.1)
BILIRUBIN TOTAL: 0.3 mg/dL (ref 0.2–1.2)
BUN: 24 mg/dL (ref 7–25)
CALCIUM: 9 mg/dL (ref 8.6–10.4)
CO2: 23 mmol/L (ref 20–31)
Chloride: 103 mmol/L (ref 98–110)
Creat: 1.04 mg/dL (ref 0.50–1.05)
Glucose, Bld: 216 mg/dL — ABNORMAL HIGH (ref 65–99)
POTASSIUM: 4 mmol/L (ref 3.5–5.3)
Sodium: 137 mmol/L (ref 135–146)
TOTAL PROTEIN: 7 g/dL (ref 6.1–8.1)

## 2016-02-20 LAB — LIPID PANEL
CHOL/HDL RATIO: 4.5 ratio (ref <=5.0)
CHOLESTEROL: 207 mg/dL — AB (ref 125–200)
HDL: 46 mg/dL (ref >=46)
LDL CALC: 112 mg/dL (ref <130)
Triglycerides: 244 mg/dL — ABNORMAL HIGH (ref <150)
VLDL: 49 mg/dL — ABNORMAL HIGH (ref <30)

## 2016-02-20 LAB — VITAMIN D 25 HYDROXY (VIT D DEFICIENCY, FRACTURES): Vit D, 25-Hydroxy: 15 ng/mL — ABNORMAL LOW (ref 30–100)

## 2016-02-20 NOTE — Telephone Encounter (Signed)
LMOM for pt to CB to let her know about Lantus savings card I left for her in drawer if she wants it. Then saw that pt uses My[facility] and I sent her a message instead.

## 2016-03-06 MED ORDER — VITAMIN D (ERGOCALCIFEROL) 1.25 MG (50000 UNIT) PO CAPS: 50000.0000 [IU] | ORAL_CAPSULE | ORAL | Status: DC

## 2016-03-06 NOTE — Addendum Note (Signed)
Addended by: Ethelda ChickSMITH, Olly Shiner M on: 03/06/2016 11:42 AM   Modules accepted: Orders

## 2016-03-31 ENCOUNTER — Telehealth: Payer: Self-pay

## 2016-03-31 NOTE — Telephone Encounter (Signed)
Fax from pharm advising that Lantus is non-formulary and needs PA. Ins will not cover unless pt has first tried their preferred meds. We do have a savings card that will work for any commercial ins so pt can get Lantus for $10 even if not covered by ins. However, if pt has a high deductible plan she might rather change to Basaglar (preferred by state plan)  which, with savings card is $5 and covered amount goes toward her deductible. LMOM for CB to discuss.

## 2016-04-02 NOTE — Telephone Encounter (Signed)
LMOM again on H and C #s to CB.

## 2016-05-09 ENCOUNTER — Other Ambulatory Visit: Payer: Self-pay | Admitting: Unknown Physician Specialty

## 2016-05-09 DIAGNOSIS — R9389 Abnormal findings on diagnostic imaging of other specified body structures: Secondary | ICD-10-CM

## 2016-05-14 ENCOUNTER — Ambulatory Visit: Payer: BC Managed Care – PPO

## 2016-05-21 ENCOUNTER — Telehealth: Payer: Self-pay

## 2016-05-21 NOTE — Telephone Encounter (Signed)
Fax request for refill: BD Pen needle Nano 32g x 5  #100 Sig: inject 1 each into skin daily Pt has appointment with Katrinka BlazingSmith on 06/24/2016. Please advise.

## 2016-05-22 MED ORDER — PEN NEEDLES 32G X 4 MM MISC: 1.0000 | Freq: Every day | Status: DC

## 2016-05-22 NOTE — Telephone Encounter (Signed)
Refill faxed to [facility].  Please confirm that received correct rx.

## 2016-05-22 NOTE — Telephone Encounter (Signed)
Confirmed w/pharm is correct Rx.

## 2016-05-22 NOTE — Addendum Note (Signed)
Addended by: Ethelda ChickSMITH, Henri Guedes M on: 05/22/2016 09:48 AM   Modules accepted: Orders

## 2016-05-26 ENCOUNTER — Ambulatory Visit
Admission: RE | Admit: 2016-05-26 | Discharge: 2016-05-26 | Disposition: A | Discharge status: Home / Self Care | Payer: BC Managed Care – PPO | Source: Ambulatory Visit | Attending: Unknown Physician Specialty | Admitting: Unknown Physician Specialty

## 2016-05-26 DIAGNOSIS — R938 Abnormal findings on diagnostic imaging of other specified body structures: Secondary | ICD-10-CM | POA: Insufficient documentation

## 2016-05-26 DIAGNOSIS — R9389 Abnormal findings on diagnostic imaging of other specified body structures: Secondary | ICD-10-CM

## 2016-05-26 LAB — POCT I-STAT CREATININE: Creatinine, Ser: 1 mg/dL (ref 0.44–1.00)

## 2016-05-26 MED ORDER — IOPAMIDOL (ISOVUE-300) INJECTION 61%
75.0000 mL | Freq: Once | INTRAVENOUS | Status: AC | PRN
  Administered 2016-05-26: 75 mL via INTRAVENOUS

## 2016-06-10 ENCOUNTER — Other Ambulatory Visit: Payer: Self-pay | Admitting: Family Medicine

## 2016-06-10 DIAGNOSIS — Z1231 Encounter for screening mammogram for malignant neoplasm of breast: Secondary | ICD-10-CM

## 2016-06-11 ENCOUNTER — Other Ambulatory Visit: Payer: Self-pay | Admitting: Family Medicine

## 2016-06-11 ENCOUNTER — Ambulatory Visit
Admission: RE | Admit: 2016-06-11 | Discharge: 2016-06-11 | Disposition: A | Discharge status: Home / Self Care | Payer: BC Managed Care – PPO | Source: Ambulatory Visit | Attending: Family Medicine | Admitting: Family Medicine

## 2016-06-11 DIAGNOSIS — Z1231 Encounter for screening mammogram for malignant neoplasm of breast: Secondary | ICD-10-CM | POA: Diagnosis present

## 2016-06-24 ENCOUNTER — Encounter: Payer: Self-pay | Admitting: Family Medicine

## 2016-06-24 ENCOUNTER — Ambulatory Visit (INDEPENDENT_AMBULATORY_CARE_PROVIDER_SITE_OTHER): Payer: BC Managed Care – PPO | Admitting: Family Medicine

## 2016-06-24 VITALS — BP 116/78 | HR 117 | Temp 99.1°F | Ht 64.75 in | Wt 277.8 lb

## 2016-06-24 DIAGNOSIS — N950 Postmenopausal bleeding: Secondary | ICD-10-CM | POA: Diagnosis not present

## 2016-06-24 DIAGNOSIS — E559 Vitamin D deficiency, unspecified: Secondary | ICD-10-CM

## 2016-06-24 DIAGNOSIS — I10 Essential (primary) hypertension: Secondary | ICD-10-CM

## 2016-06-24 DIAGNOSIS — D509 Iron deficiency anemia, unspecified: Secondary | ICD-10-CM | POA: Diagnosis not present

## 2016-06-24 DIAGNOSIS — M3130 Wegener's granulomatosis without renal involvement: Secondary | ICD-10-CM | POA: Diagnosis not present

## 2016-06-24 DIAGNOSIS — E1142 Type 2 diabetes mellitus with diabetic polyneuropathy: Secondary | ICD-10-CM | POA: Diagnosis not present

## 2016-06-24 DIAGNOSIS — M25569 Pain in unspecified knee: Secondary | ICD-10-CM | POA: Diagnosis not present

## 2016-06-24 DIAGNOSIS — M25559 Pain in unspecified hip: Secondary | ICD-10-CM

## 2016-06-24 DIAGNOSIS — E7800 Pure hypercholesterolemia, unspecified: Secondary | ICD-10-CM

## 2016-06-24 DIAGNOSIS — M2550 Pain in unspecified joint: Secondary | ICD-10-CM

## 2016-06-24 LAB — CBC WITH DIFFERENTIAL/PLATELET
BASOS ABS: 0 {cells}/uL (ref 0–200)
Basophils Relative: 0 %
EOS PCT: 3 %
Eosinophils Absolute: 327 cells/uL (ref 15–500)
HCT: 36.4 % (ref 35.0–45.0)
Hemoglobin: 11.7 g/dL (ref 11.7–15.5)
LYMPHS PCT: 15 %
Lymphs Abs: 1635 cells/uL (ref 850–3900)
MCH: 22.2 pg — AB (ref 27.0–33.0)
MCHC: 32.1 g/dL (ref 32.0–36.0)
MCV: 69.1 fL — ABNORMAL LOW (ref 80.0–100.0)
MONOS PCT: 8 %
MPV: 9.2 fL (ref 7.5–12.5)
Monocytes Absolute: 872 cells/uL (ref 200–950)
NEUTROS PCT: 74 %
Neutro Abs: 8066 cells/uL — ABNORMAL HIGH (ref 1500–7800)
Platelets: 384 10*3/uL (ref 140–400)
RBC: 5.27 MIL/uL — AB (ref 3.80–5.10)
RDW: 17 % — AB (ref 11.0–15.0)
WBC: 10.9 10*3/uL — AB (ref 3.8–10.8)

## 2016-06-24 LAB — POCT SEDIMENTATION RATE: POCT SED RATE: 27 mm/h — AB (ref 0–22)

## 2016-06-24 LAB — LIPID PANEL
CHOL/HDL RATIO: 2.8 ratio (ref <=5.0)
Cholesterol: 145 mg/dL (ref 125–200)
HDL: 52 mg/dL (ref >=46)
LDL Cholesterol: 67 mg/dL (ref <130)
TRIGLYCERIDES: 131 mg/dL (ref <150)
VLDL: 26 mg/dL (ref <30)

## 2016-06-24 LAB — IBC PANEL
%SAT: 8 % — AB (ref 11–50)
TIBC: 418 ug/dL (ref 250–450)
UIBC: 385 ug/dL (ref 125–400)

## 2016-06-24 LAB — COMPREHENSIVE METABOLIC PANEL
ALT: 11 U/L (ref 6–29)
AST: 12 U/L (ref 10–35)
Albumin: 4 g/dL (ref 3.6–5.1)
Alkaline Phosphatase: 90 U/L (ref 33–130)
BILIRUBIN TOTAL: 0.5 mg/dL (ref 0.2–1.2)
BUN: 20 mg/dL (ref 7–25)
CHLORIDE: 101 mmol/L (ref 98–110)
CO2: 21 mmol/L (ref 20–31)
CREATININE: 0.91 mg/dL (ref 0.50–1.05)
Calcium: 9 mg/dL (ref 8.6–10.4)
GLUCOSE: 88 mg/dL (ref 65–99)
Potassium: 4.4 mmol/L (ref 3.5–5.3)
SODIUM: 136 mmol/L (ref 135–146)
Total Protein: 7 g/dL (ref 6.1–8.1)

## 2016-06-24 LAB — POCT URINE PREGNANCY: Preg Test, Ur: NEGATIVE

## 2016-06-24 LAB — IRON: Iron: 33 ug/dL — ABNORMAL LOW (ref 45–160)

## 2016-06-24 LAB — GLUCOSE, POCT (MANUAL RESULT ENTRY): POC GLUCOSE: 102 mg/dL — AB (ref 70–99)

## 2016-06-24 LAB — POCT GLYCOSYLATED HEMOGLOBIN (HGB A1C): HEMOGLOBIN A1C: 8.1

## 2016-06-24 NOTE — Progress Notes (Signed)
Subjective:    Patient ID: Rachel Walls, female    DOB: [birth year], 50 y.o.   MRN: [id number]  06/24/2016  Diabetes and Medication Refill   HPI This 50 y.o. female presents for three month follow-up:  1.  DMII: Patient reports good compliance with medication, good tolerance to medication, and good symptom control. Lantus 26 units daily. Sugars running 88-100 in [place].  Ate early for dinner in [place].  Eats much later at home.  Not losing weight.  Using an APP was not successful either.  Ines Bloomer is doing well; portion control; no NutriSystem.   Previous nutritionist with diabetes dx.  Fussy eater.  Cannot eat wheat. WW maybe; Arna Snipe is difficult because of fussy eater.  Weighs daily.  Weighs every week at [employer].  2. HTN: Patient reports good compliance with medication, good tolerance to medication, and good symptom control.    3.  Menopause: LMP one year ago.  Yesterday, got a really sharp pain in L hip and radiated into L hip.  Started bleeding vaginally.  Outside of vagina is inflamed and is itchy.  Just occurred; no recent issues; suddenly has issues.    Now bleeding heavily.  6.  Arthralgias:  Usually wakes up stiff and it works itself out.  Now pain joints continues; knees B, ankles, shoulders B; R elbow; B hands.  PIP R fifth finger.  Seems migratory.  Very odd.  Not sure why.  Last visit with Terie Purser not sure.     Review of Systems  Constitutional: Negative for chills, diaphoresis, fatigue and fever.  Eyes: Negative for visual disturbance.  Respiratory: Negative for cough and shortness of breath.   Cardiovascular: Negative for chest pain, palpitations and leg swelling.  Gastrointestinal: Negative for abdominal pain, constipation, diarrhea, nausea and vomiting.  Endocrine: Negative for cold intolerance, heat intolerance, polydipsia, polyphagia and polyuria.  Genitourinary: Positive for menstrual problem, pelvic pain and vaginal bleeding. Negative for vaginal discharge and  vaginal pain.  Musculoskeletal: Positive for arthralgias. Negative for back pain, gait problem, joint swelling, myalgias, neck pain and neck stiffness.  Neurological: Negative for dizziness, tremors, seizures, syncope, facial asymmetry, speech difficulty, weakness, light-headedness, numbness and headaches.  Psychiatric/Behavioral: Negative for dysphoric mood and sleep disturbance. The patient is not nervous/anxious.     Past Medical History:  Diagnosis Date  . Allergic rhinitis, cause unspecified   . Anemia   . Anxiety   . Arthritis   . Asthma   . Chronic kidney disease   . Diabetes mellitus    Type 2  . GERD (gastroesophageal reflux disease)   . Hypertension   . Hypertensive retinopathy   . Hypothyroidism   . Nonspecific elevation of levels of transaminase or lactic acid dehydrogenase (LDH)   . Obesity, unspecified   . Other and unspecified hyperlipidemia   . Psychic factors associated with disease    Psychogenic factors with other diseases  . Unspecified vitamin D deficiency   . Venous engorgement of retina   . Wegener's granulomatosis (HCC)    Past Surgical History:  Procedure Laterality Date  . abdominal ultrasound  05/17/2012   severe hepatomegaly at 22 cm; gallbladder normal.   [facility].  . Calcaneous bone spur surgery     x2  . CT abdomen  06/10/12   Hepatomegaly.  [facility].  [facility] ESOPHAGOGASTRODUODENOSCOPY  08/19/12   diffuse gastritis antrum; pathology negative for malignancy, H. Pylori; esophagus and duodenum normal.  . hida scan  08/27/12   normal; EF 62%.  [facility].  [facility] LUNG BIOPSY  1999  . Meniscus tear     L; x 3 tears.  [facility].  [facility] RENAL BIOPSY  1999  . 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 TONSILLECTOMY     Allergies  Allergen Reactions  . Penicillins Hives  . Sulfonamide Derivatives     Reaction: arthralgias.   Current Outpatient Prescriptions  Medication Sig Dispense Refill  . atorvastatin (LIPITOR) 10 MG tablet Take 1  tablet (10 mg total) by mouth daily. 90 tablet 1  . DULoxetine (CYMBALTA) 60 MG capsule Take 1 capsule (60 mg total) by mouth 2 (two) times daily. 60 capsule 11  . empagliflozin (JARDIANCE) 25 MG TABS tablet Take 25 mg by mouth daily. 30 tablet 11  . fluticasone (FLONASE) 50 MCG/ACT nasal spray Place 2 sprays into both nostrils daily. 16 g 11  . gabapentin (NEURONTIN) 800 MG tablet Take 1 tablet (800 mg total) by mouth 3 (three) times daily. 90 tablet 11  . glipiZIDE (GLUCOTROL XL) 10 MG 24 hr tablet Take 1 tablet (10 mg total) by mouth daily with breakfast. 30 tablet 11  . Insulin Glargine (LANTUS) 100 UNIT/ML Solostar Pen Inject 26 Units into the skin daily at 10 pm. 15 mL 11  . Insulin Pen Needle (PEN NEEDLES) 32G X 4 MM MISC Inject 1 each into the skin daily. (DX: E11.9) 100 each 4  . lansoprazole (PREVACID) 30 MG capsule Take 1 capsule (30 mg total) by mouth daily. 30 capsule 11  . levothyroxine (SYNTHROID, LEVOTHROID) 100 MCG tablet Take 1 tablet (100 mcg total) by mouth daily. PATIENT NEEDS OFFICE VISIT FOR ADDITIONAL REFILLS 30 tablet 5  . metFORMIN (GLUCOPHAGE) 500 MG tablet Take 1 tablet (500 mg total) by mouth every evening. With food. 90 tablet 3  . mycophenolate (CELLCEPT) 250 MG capsule Take 250 mg by mouth at bedtime. 2 tab in the morning & 1 tab at night.    . ramipril (ALTACE) 5 MG capsule Take 1 capsule (5 mg total) by mouth daily. 90 capsule 3  . Vitamin D, Ergocalciferol, (DRISDOL) 50000 units CAPS capsule Take 1 capsule (50,000 Units total) by mouth every 7 (seven) days. 12 capsule 1  . albuterol (PROVENTIL HFA;VENTOLIN HFA) 108 (90 BASE) MCG/ACT inhaler Inhale 90 mcg into the lungs every 4 (four) hours as needed.    . canagliflozin (INVOKANA) 300 MG TABS tablet Take 300 mg by mouth daily. (Patient not taking: Reported on 02/19/2016) 30 tablet 11  . Cholecalciferol (VITAMIN D3) 1000 UNITS CAPS Take 1,000 mg by mouth daily. Reported on 11/09/2015    . Insulin Detemir (LEVEMIR) 100  UNIT/ML Pen Inject 22 Units into the skin daily at 10 pm. (Patient not taking: Reported on 06/24/2016) 15 mL 11  . loratadine (CLARITIN) 10 MG tablet Take 10 mg by mouth daily as needed.     . sitaGLIPtin (JANUVIA) 100 MG tablet Take 1 tablet (100 mg total) by mouth daily. Take with food. 30 tablet 2   No current facility-administered medications for this visit.    Social History   Social History  . Marital status: Married    Spouse name: N/A  . Number of children: 0  . Years of education: college   Occupational History  . 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 Smoking status: Never Smoker  . Smokeless tobacco: Never Used     Comment: Tobacco use-no  .  Alcohol use No  . Drug use: No  . Sexual activity: Yes    Partners: Male   Other Topics Concern  . Not on file   Social History Narrative   Married x 25 years;happily married; no abuse.      Children: none      Lives: with husband, 3 cats, 1 dog.      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      Tobacco: never      Alcohol: never      Drugs: none      Exercise: none      Seatbelt: 100%      Guns: none             Family History  Problem Relation Age of Onset  . Hyperlipidemia Mother     Hypercholesterolemia  . Arthritis Mother     OA  . Sarcoidosis Father   . Heart disease Maternal Grandfather   . Emphysema Maternal Grandfather     Cause of death  . Diabetes Maternal Grandfather   . Heart failure Paternal Grandfather     CHF  . Heart disease Paternal Grandfather   . Asthma Brother   . Alcohol abuse Brother   . Emphysema Maternal Grandmother        Objective:    BP 116/78 (BP Location: Left Arm, Patient Position: Sitting, Cuff Size: Large)   Pulse (!) 117   Temp 99.1 F (37.3 C) (Oral)   Ht 5' 4.75" (1.645 m)   Wt 277 lb 12.8 oz (126 kg)   LMP 06/24/2015 Comment: per patient periods just decided to stop  BMI 46.59 kg/m    Physical Exam  Constitutional: She is oriented to person, place, and time. She appears well-developed and well-nourished. No distress.  HENT:  Head: Normocephalic and atraumatic.  Right Ear: External ear normal.  Left Ear: External ear normal.  Nose: Nose normal.  Mouth/Throat: Oropharynx is clear and moist.  Eyes: Conjunctivae and EOM are normal. Pupils are equal, round, and reactive to light.  Neck: Normal range of motion. Neck supple. Carotid bruit is not present. No thyromegaly present.  Cardiovascular: Normal rate, regular rhythm, normal heart sounds and intact distal pulses.  Exam reveals no gallop and no friction rub.   No murmur heard. Pulmonary/Chest: Effort normal and breath sounds normal. She has no wheezes. She has no rales.  Abdominal: Soft. Bowel sounds are normal. She exhibits no distension and no mass. There is no tenderness. There is no rebound and no guarding.  Genitourinary: Vagina normal and uterus normal. There is no rash, tenderness or lesion on the right labia. There is no rash, tenderness or lesion on the left labia. Right adnexum displays no mass, no tenderness and no fullness. Left adnexum displays no mass, no tenderness and no fullness. No vaginal discharge found.  Lymphadenopathy:    She has no cervical adenopathy.  Neurological: She is alert and oriented to person, place, and time. No cranial nerve deficit.  Skin: Skin is warm and dry. No rash noted. She is not diaphoretic. No erythema. No pallor.  Psychiatric: She has a normal mood and affect. Her behavior is normal.        Assessment & Plan:   1. Diabetic peripheral neuropathy (HCC)   2. Essential hypertension, benign   3. Wegener's granulomatosis (HCC)   4. Pure hypercholesterolemia   5. Vitamin D deficiency   6. Anemia, iron deficiency  7. Postmenopausal bleeding   8. Chronic arthralgias of knees and hips, unspecified laterality   9. Arthralgia    -new onset vaginal bleeding after one year  without menses; obtain labs; refer to gynecology for endometrial bx and pelvic US.  -obtain labs. -recommend follow-up with rheumatology due to recent  Bilateral arthralgias.   Orders Placed This Encounter  Procedures  . CBC with Differential/Platelet  . Comprehensive metabolic panel    Order Specific Question:   Has the patient fasted?    Answer:   Yes  . Lipid panel    Order Specific Question:   Has the patient fasted?    Answer:   Yes  . VITAMIN D 25 Hydroxy (Vit-D Deficiency, Fractures)  . Iron  . IBC panel  . Follicle Stimulating Hormone  . Luteinizing hormone  . Ambulatory referral to Gynecology    Referral Priority:   Routine    Referral Type:   Consultation    Referral Reason:   Specialty Services Required    Requested Specialty:   Gynecology    Number of Visits Requested:   1  . POCT glycosylated hemoglobin (Hb A1C)  . POCT glucose (manual entry)  . POCT urine pregnancy  . POCT SEDIMENTATION RATE  . HM Diabetes Foot Exam   No orders of the defined types were placed in this encounter.   Return in about 3 months (around 09/24/2016) for complete physical examiniation.   Kristi Paulita Fujita, M.D. [facility] [address] [place], [place]  [address] [phone] phone [phone] fax

## 2016-06-24 NOTE — Patient Instructions (Addendum)
  Increase Lantus from 36 units daily. [employer].   IF you received an x-ray today, you will receive an invoice from [facility]. Please contact [facility] at [phone] with questions or concerns regarding your invoice.   IF you received labwork today, you will receive an invoice from [employer]. Please contact [facility] at [phone] with questions or concerns regarding your invoice.   Our billing staff will not be able to assist you with questions regarding bills from these companies.  You will be contacted with the lab results as soon as they are available. The fastest way to get your results is to activate your My Chart account. Instructions are located on the last page of this paperwork. If you have not heard from [place] regarding the results in 2 weeks, please contact this office.

## 2016-06-25 LAB — LUTEINIZING HORMONE: LH: 6.3 m[IU]/mL

## 2016-06-25 LAB — FOLLICLE STIMULATING HORMONE: FSH: 12.4 m[IU]/mL

## 2016-06-25 LAB — VITAMIN D 25 HYDROXY (VIT D DEFICIENCY, FRACTURES): VIT D 25 HYDROXY: 25 ng/mL — AB (ref 30–100)

## 2016-07-15 ENCOUNTER — Encounter: Payer: Self-pay | Admitting: Family Medicine

## 2016-08-16 ENCOUNTER — Ambulatory Visit (INDEPENDENT_AMBULATORY_CARE_PROVIDER_SITE_OTHER): Payer: BC Managed Care – PPO | Admitting: Family Medicine

## 2016-08-16 ENCOUNTER — Ambulatory Visit (INDEPENDENT_AMBULATORY_CARE_PROVIDER_SITE_OTHER): Payer: BC Managed Care – PPO

## 2016-08-16 VITALS — BP 130/80 | HR 90 | Temp 98.5°F | Resp 17 | Ht 65.5 in | Wt 280.0 lb

## 2016-08-16 DIAGNOSIS — M545 Low back pain, unspecified: Secondary | ICD-10-CM

## 2016-08-16 DIAGNOSIS — N921 Excessive and frequent menstruation with irregular cycle: Secondary | ICD-10-CM

## 2016-08-16 DIAGNOSIS — Z23 Encounter for immunization: Secondary | ICD-10-CM | POA: Diagnosis not present

## 2016-08-16 DIAGNOSIS — E559 Vitamin D deficiency, unspecified: Secondary | ICD-10-CM | POA: Diagnosis not present

## 2016-08-16 DIAGNOSIS — M25562 Pain in left knee: Secondary | ICD-10-CM | POA: Diagnosis not present

## 2016-08-16 LAB — POCT URINE PREGNANCY: Preg Test, Ur: NEGATIVE

## 2016-08-16 MED ORDER — VITAMIN D (ERGOCALCIFEROL) 1.25 MG (50000 UNIT) PO CAPS: 50000.0000 [IU] | ORAL_CAPSULE | ORAL | 1 refills | Status: DC

## 2016-08-16 MED ORDER — CELECOXIB 200 MG PO CAPS: 200.0000 mg | ORAL_CAPSULE | Freq: Two times a day (BID) | ORAL | 0 refills | Status: DC

## 2016-08-16 MED ORDER — ATORVASTATIN CALCIUM 10 MG PO TABS: 10.0000 mg | ORAL_TABLET | Freq: Every day | ORAL | 3 refills | Status: DC

## 2016-08-16 NOTE — Patient Instructions (Addendum)
   IF you received an x-ray today, you will receive an invoice from [facility]. Please contact [facility] at [phone] with questions or concerns regarding your invoice.   IF you received labwork today, you will receive an invoice from [facility]. Please contact [facility] at [phone] with questions or concerns regarding your invoice.   Our billing staff will not be able to assist you with questions regarding bills from these companies.  You will be contacted with the lab results as soon as they are available. The fastest way to get your results is to activate your My Chart account. Instructions are located on the last page of this paperwork. If you have not heard from us regarding the results in 2 weeks, please contact this office.     Meniscus Tear With Phase I Rehab The meniscus is a C-shaped cartilage structure, located in the knee joint between the thigh bone (femur) and the shinbone (tibia). Two menisci are located in each knee joint: the inner and outer meniscus. The meniscus acts as an adapter between the thigh bone and shinbone, allowing them to fit properly together. It also functions as a shock absorber, to reduce the stress placed on the knee joint and to help supply nutrients to the knee joint cartilage. As people age, the meniscus begins to harden and become more vulnerable to injury. Meniscus tears are a common injury, especially in older athletes. Inner meniscus tears are more common than outer meniscus tears.  SYMPTOMS   Pain in the knee, especially with standing or squatting with the affected leg.  Tenderness along the joint line.  Swelling in the knee joint (effusion), usually starting 1 to 2 days after injury.  Locking or catching of the knee joint, causing inability to straighten the knee completely.  Giving way or buckling of the knee. CAUSES  A meniscus tear occurs when a force is placed on the meniscus that is  greater than it can handle. Common causes of injury include:  Direct hit (trauma) to the knee.  Twisting, pivoting, or cutting (rapidly changing direction while running), kneeling or squatting.  Without injury, due to aging. RISK INCREASES WITH:  Contact sports (football, rugby).  Sports in which cleats are used with pivoting (soccer, lacrosse) or sports in which good shoe grip and sudden change in direction are required (racquetball, basketball, squash).  Previous knee injury.  Associated knee injury, particularly ligament injuries.  Poor strength and flexibility. PREVENTION  Warm up and stretch properly before activity.  Maintain physical fitness:  Strength, flexibility, and endurance.  Cardiovascular fitness.  Protect the knee with a brace or elastic bandage.  Wear properly fitted protective equipment (proper cleats for the surface). PROGNOSIS  Sometimes, meniscus tears heal on their own. However, definitive treatment requires surgery, followed by at least 6 weeks of recovery.  RELATED COMPLICATIONS   Recurring symptoms that result in a chronic problem.  Repeated knee injury, especially if sports are resumed too soon after injury or surgery.  Progression of the tear (the tear gets larger), if untreated.  Arthritis of the knee in later years (with or without surgery).  Complications of surgery, including infection, bleeding, injury to nerves (numbness, weakness, paralysis) continued pain, giving way, locking, nonhealing of meniscus (if repaired), need for further surgery, and knee stiffness (loss of motion). TREATMENT  Treatment first involves the use of ice and medicine, to reduce pain and inflammation. You may find using crutches to walk more comfortable. However, it is okay to   bear weight on the injured knee, if the pain will allow it. Surgery is often advised as a definitive treatment. Surgery is performed through an incision near the joint (arthroscopically). The  torn piece of the meniscus is removed, and if possible the joint cartilage is repaired. After surgery, the joint must be restrained. After restraint, it is important to perform strengthening and stretching exercises to help regain strength and a full range of motion. These exercises may be completed at home or with a therapist.  MEDICATION  If pain medicine is needed, nonsteroidal anti-inflammatory medicines (aspirin and ibuprofen), or other minor pain relievers (acetaminophen), are often advised.  Do not take pain medicine for 7 days before surgery.  Prescription pain relievers may be given, if your caregiver thinks they are needed. Use only as directed and only as much as you need. HEAT AND COLD  Cold treatment (icing) should be applied for 10 to 15 minutes every 2 to 3 hours for inflammation and pain, and immediately after activity that aggravates your symptoms. Use ice packs or an ice massage.  Heat treatment may be used before performing stretching and strengthening activities prescribed by your caregiver, physical therapist, or athletic trainer. Use a heat pack or a warm water soak. SEEK MEDICAL CARE IF:   Symptoms get worse or do not improve in 2 weeks, despite treatment.  New, unexplained symptoms develop. (Drugs used in treatment may produce side effects.) EXERCISES RANGE OF MOTION (ROM) AND STRETCHING EXERCISES - Meniscus Tear, Non-operative, Phase I These are some of the initial exercises with which you may start your rehabilitation program, until you see your caregiver again or until your symptoms are resolved. Remember:   These initial exercises are intended to be gentle. They will help you restore motion without increasing any swelling.  Completing these exercises allows less painful movement and prepares you for the more aggressive strengthening exercises in Phase II.  An effective stretch should be held for at least 30 seconds.  A stretch should never be painful. You  should only feel a gentle lengthening or release in the stretched tissue. RANGE OF MOTION - Knee Flexion, Active  Lie on your back with both knees straight. (If this causes back discomfort, bend your healthy knee, placing your foot flat on the floor.)  Slowly slide your heel back toward your buttocks until you feel a gentle stretch in the front of your knee or thigh.  Hold for __________ seconds. Slowly slide your heel back to the starting position. Repeat __________ times. Complete this exercise __________ times per day.  RANGE OF MOTION - Knee Flexion and Extension, Active-Assisted  Sit on the edge of a table or chair with your thighs firmly supported. It may be helpful to place a folded towel under the end of your right / left thigh.  Flexion (bending): Place the ankle of your healthy leg on top of the other ankle. Use your healthy leg to gently bend your right / left knee until you feel a mild tension across the top of your knee.  Hold for __________ seconds.  Extension (straightening): Switch your ankles so your right / left leg is on top. Use your healthy leg to straighten your right / left knee until you feel a mild tension on the backside of your knee.  Hold for __________ seconds. Repeat __________ times. Complete __________ times per day. STRETCH - Knee Flexion, Supine  Lie on the floor with your right / left heel and foot lightly touching the wall. (  Place both feet on the wall if you do not use a door frame.)  Without using any effort, allow gravity to slide your foot down the wall slowly until you feel a gentle stretch in the front of your right / left knee.  Hold this stretch for __________ seconds. Then return the leg to the starting position, using your healthy leg for help, if needed. Repeat __________ times. Complete this stretch __________ times per day.  STRETCH - Knee Extension Sitting  Sit with your right / left leg/heel propped on another chair, coffee table, or  foot stool.  Allow your leg muscles to relax, letting gravity straighten out your knee.*  You should feel a stretch behind your right / left knee. Hold this position for __________ seconds. Repeat __________ times. Complete this stretch __________ times per day.  *Your physician, physical therapist or athletic trainer may instruct you place a __________ weight on your thigh, just above your kneecap, to deepen the stretch.  STRENGTHENING EXERCISES - Meniscus Tear, Non-operative, Phase I These exercises may help you when beginning to rehabilitate your injury. They may resolve your symptoms with or without further involvement from your physician, physical therapist or athletic trainer. While completing these exercises, remember:   Muscles can gain both the endurance and the strength needed for everyday activities through controlled exercises.  Complete these exercises as instructed by your physician, physical therapist or athletic trainer. Progress the resistance and repetitions only as guided. STRENGTH - Quadriceps, Isometrics  Lie on your back with your right / left leg extended and your opposite knee bent.  Gradually tense the muscles in the front of your right / left thigh. You should see either your knee cap slide up toward your hip or increased dimpling just above the knee. This motion will push the back of the knee down toward the floor, mat, or bed on which you are lying.  Hold the muscle as tight as you can, without increasing your pain, for __________ seconds.  Relax the muscles slowly and completely between each repetition. Repeat __________ times. Complete this exercise __________ times per day.  STRENGTH - Quadriceps, Short Arcs   Lie on your back. Place a __________ inch towel roll under your right / left knee, so that the knee bends slightly.  Raise only your lower leg by tightening the muscles in the front of your thigh. Do not allow your thigh to rise.  Hold this position  for __________ seconds. Repeat __________ times. Complete this exercise __________ times per day.  OPTIONAL ANKLE WEIGHTS: Begin with ____________________, but DO NOT exceed ____________________. Increase in 1 pound/0.5 kilogram increments. STRENGTH - Quadriceps, Straight Leg Raises  Quality counts! Watch for signs that the quadriceps muscle is working, to be sure you are strengthening the correct muscles and not "cheating" by substituting with healthier muscles.  Lay on your back with your right / left leg extended and your opposite knee bent.  Tense the muscles in the front of your right / left thigh. You should see either your knee cap slide up or increased dimpling just above the knee. Your thigh may even shake a bit.  Tighten these muscles even more and raise your leg 4 to 6 inches off the floor. Hold for __________ seconds.  Keeping these muscles tense, lower your leg.  Relax the muscles slowly and completely in between each repetition. Repeat __________ times. Complete this exercise __________ times per day.  STRENGTH - Hamstring, Curls   Lay on your stomach   with your legs extended. (If you lay on a bed, your feet may hang over the edge.)  Tighten the muscles in the back of your thigh to bend your right / left knee up to 90 degrees. Keep your hips flat on the bed.  Hold this position for __________ seconds.  Slowly lower your leg back to the starting position. Repeat __________ times. Complete this exercise __________ times per day.  STRENGTH - Quadriceps, Squats  Stand in a door frame so that your feet and knees are in line with the frame.  Use your hands for balance, not support, on the frame.  Slowly lower your weight, bending at the hips and knees. Keep your lower legs upright so that they are parallel with the door frame. Squat only within the range that does not increase your knee pain. Never let your hips drop below your knees.  Slowly return upright, pushing with your  legs, not pulling with your hands. Repeat __________ times. Complete this exercise __________ times per day.  STRENGTH - Quad/VMO, Isometric   Sit in a chair with your right / left knee slightly bent. With your fingertips, feel the VMO muscle just above the inside of your knee. The VMO is important in controlling the position of your kneecap.  Keeping your fingertips on this muscle. Without actually moving your leg, attempt to drive your knee down as if straightening your leg. You should feel your VMO tense. If you have a difficult time, you may wish to try the same exercise on your healthy knee first.  Tense this muscle as hard as you can without increasing any knee pain.  Hold for __________ seconds. Relax the muscles slowly and completely in between each repetition. Repeat __________ times. Complete exercise __________ times per day.    This information is not intended to replace advice given to you by your health care provider. Make sure you discuss any questions you have with your health care provider.   Document Released: 11/24/1998 Document Revised: 03/27/2015 Document Reviewed: 02/22/2009 [employer] Interactive Patient Education 2016 [employer]  

## 2016-08-16 NOTE — Progress Notes (Addendum)
By signing my name below, I, Mesha Guinyard, attest that this documentation has been prepared under the direction and in the presence of Nilda SimmerKristi Lakelyn Straus, MD.  Electronically Signed: Arvilla MarketMesha Guinyard, Medical Scribe. 08/16/16. 12:10 PM.  Subjective:    Patient ID: Rachel Walls, female    DOB: 11/13/1966, 50 y.o.   MRN: [id number]  08/16/2016  Back Pain; Knee Pain (left side ); and Immunizations (flu)   HPI  HPI Comments: Rachel Walls is a 50 y.o. female who presents to the [facility] complaining of lt knee pain onset a week ago without injury. Pt reports associated symptoms of her knee giving out while she's walking or standing, back pain that occasionally radiates to her legs, pain in her feet. Pt mentions the back of her calf became so tight last night she began to have numbness in her foot. Pt stood on her feet for 5 hours when she was having a back to school party three weeks ago when she experienced back pain that radiated to her legs. Pt reports having pain in her feet when she gets up in the morning. Pt states when she's walking around her back is fine, but when she gets home and she starts to relax her back becomes stiff and she's in pain- pt was so stiff one day she had to ask for help to get up. Pt has lower back pain that is worse in the morning when she wakes up. Pt has to go up the stairs about 10x a day and occasionally her knees hurt then. Pt denies burning, tingling, and irregular bm and urination; denies saddle paresthesias.  S/p meniscus tear repair six years ago; had three separate tears menisci.  Has been taking Ibuprofen 800mg  every morning.    Vaginal bleeding: seven day menses after last visit and very heavy; having intermittent L pelvic pain.  Appointment with gynecology this week.  FSH and LH from last visit WNL and not in menopausal range.  Vitamin D deficiency: needs refill on weekly vitamin D supplementation; vitamin D 25 at last  visit.   Review of Systems  Constitutional: Negative for chills, diaphoresis, fatigue and fever.  Eyes: Negative for visual disturbance.  Respiratory: Negative for cough and shortness of breath.   Cardiovascular: Negative for chest pain, palpitations and leg swelling.  Gastrointestinal: Negative for abdominal pain, constipation, diarrhea, nausea and vomiting.  Endocrine: Negative for cold intolerance, heat intolerance, polydipsia, polyphagia and polyuria.  Musculoskeletal: Positive for arthralgias, back pain and myalgias.  Neurological: Positive for numbness. Negative for dizziness, tremors, seizures, syncope, facial asymmetry, speech difficulty, weakness, light-headedness and headaches.   Past Medical History:  Diagnosis Date  . Allergic rhinitis, cause unspecified   . Anemia   . Anxiety   . Arthritis   . Asthma   . Chronic kidney disease   . Diabetes mellitus    Type 2  . GERD (gastroesophageal reflux disease)   . Hypertension   . Hypertensive retinopathy   . Hypothyroidism   . Nonspecific elevation of levels of transaminase or lactic acid dehydrogenase (LDH)   . Obesity, unspecified   . Other and unspecified hyperlipidemia   . Psychic factors associated with disease    Psychogenic factors with other diseases  . Unspecified vitamin D deficiency   . Venous engorgement of retina   . Wegener's granulomatosis [facility](HCC)    Past Surgical History:  Procedure Laterality Date  . abdominal ultrasound  05/17/2012   severe hepatomegaly at 22 cm;  gallbladder normal.   [facility].  . Calcaneous bone spur surgery     x2  . CT abdomen  06/10/12   Hepatomegaly.  [facility].  [facility] Kitchen ESOPHAGOGASTRODUODENOSCOPY  08/19/12   diffuse gastritis antrum; pathology negative for malignancy, H. Pylori; esophagus and duodenum normal.  . hida scan  08/27/12   normal; EF 62%.  [facility].  [facility] Kitchen LUNG BIOPSY  1999  . Meniscus tear     L; x 3 tears.  [facility].  [facility] Kitchen RENAL BIOPSY  1999  . 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 TONSILLECTOMY     Allergies  Allergen Reactions  . Penicillins Hives  . Sulfonamide Derivatives     Reaction: arthralgias.   Current Outpatient Prescriptions  Medication Sig Dispense Refill  . atorvastatin (LIPITOR) 10 MG tablet Take 1 tablet (10 mg total) by mouth daily. 90 tablet 3  . Cholecalciferol (VITAMIN D3) 1000 UNITS CAPS Take 1,000 mg by mouth daily. Reported on 11/09/2015    . DULoxetine (CYMBALTA) 60 MG capsule Take 1 capsule (60 mg total) by mouth 2 (two) times daily. 60 capsule 11  . empagliflozin (JARDIANCE) 25 MG TABS tablet Take 25 mg by mouth daily. 30 tablet 11  . fluticasone (FLONASE) 50 MCG/ACT nasal spray Place 2 sprays into both nostrils daily. 16 g 11  . gabapentin (NEURONTIN) 800 MG tablet Take 1 tablet (800 mg total) by mouth 3 (three) times daily. 90 tablet 11  . glipiZIDE (GLUCOTROL XL) 10 MG 24 hr tablet Take 1 tablet (10 mg total) by mouth daily with breakfast. 30 tablet 11  . Insulin Detemir (LEVEMIR) 100 UNIT/ML Pen Inject 22 Units into the skin daily at 10 pm. 15 mL 11  . Insulin Glargine (LANTUS) 100 UNIT/ML Solostar Pen Inject 26 Units into the skin daily at 10 pm. 15 mL 11  . Insulin Pen Needle (PEN NEEDLES) 32G X 4 MM MISC Inject 1 each into the skin daily. (DX: E11.9) 100 each 4  . lansoprazole (PREVACID) 30 MG capsule Take 1 capsule (30 mg total) by mouth daily. 30 capsule 11  . levothyroxine (SYNTHROID, LEVOTHROID) 100 MCG tablet Take 1 tablet (100 mcg total) by mouth daily. PATIENT NEEDS OFFICE VISIT FOR ADDITIONAL REFILLS 30 tablet 5  . loratadine (CLARITIN) 10 MG tablet Take 10 mg by mouth daily as needed.     . metFORMIN (GLUCOPHAGE) 500 MG tablet Take 1 tablet (500 mg total) by mouth every evening. With food. 90 tablet 3  . mycophenolate (CELLCEPT) 250 MG capsule Take 250 mg by mouth at bedtime. 2 tab in the morning & 1 tab at night.    . ramipril (ALTACE) 5 MG capsule Take 1 capsule (5 mg total) by mouth daily. 90  capsule 3  . Vitamin D, Ergocalciferol, (DRISDOL) 50000 units CAPS capsule Take 1 capsule (50,000 Units total) by mouth every 7 (seven) days. 12 capsule 1  . albuterol (PROVENTIL HFA;VENTOLIN HFA) 108 (90 BASE) MCG/ACT inhaler Inhale 90 mcg into the lungs every 4 (four) hours as needed.    . canagliflozin (INVOKANA) 300 MG TABS tablet Take 300 mg by mouth daily. (Patient not taking: Reported on 08/16/2016) 30 tablet 11  . celecoxib (CELEBREX) 200 MG capsule Take 1 capsule (200 mg total) by mouth 2 (two) times daily. 45 capsule 0  . sitaGLIPtin (JANUVIA) 100 MG tablet Take 1 tablet (100 mg total) by mouth daily. Take with food. (Patient not taking: Reported on 08/16/2016) 30 tablet 2  No current facility-administered medications for this visit.    Social History   Social History  . Marital status: Married    Spouse name: N/A  . Number of children: 0  . Years of education: college   Occupational History  . 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 Smoking status: Never Smoker  . Smokeless tobacco: Never Used     Comment: Tobacco use-no  . Alcohol use No  . Drug use: No  . Sexual activity: Yes    Partners: Male   Other Topics Concern  . Not on file   Social History Narrative   Married x 25 years;happily married; no abuse.      Children: none      Lives: with husband, 3 cats, 1 dog.      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      Tobacco: never      Alcohol: never      Drugs: none      Exercise: none      Seatbelt: 100%      Guns: none             Family History  Problem Relation Age of Onset  . Hyperlipidemia Mother     Hypercholesterolemia  . Arthritis Mother     OA  . Sarcoidosis Father   . Heart disease Maternal Grandfather   . Emphysema Maternal Grandfather     Cause of death  . Diabetes Maternal Grandfather   . Heart failure Paternal Grandfather     CHF  . Heart disease Paternal  Grandfather   . Asthma Brother   . Alcohol abuse Brother   . Emphysema Maternal Grandmother        Objective:    BP 130/80 (BP Location: Right Arm, Patient Position: Sitting, Cuff Size: Large)   Pulse 90   Temp 98.5 F (36.9 C) (Oral)   Resp 17   Ht 5' 5.5" (1.664 m)   Wt 280 lb (127 kg)   LMP 06/24/2015 Comment: per patient periods just decided to stop  SpO2 96%   BMI 45.89 kg/m  Physical Exam  Constitutional: She appears well-developed and well-nourished. No distress.  HENT:  Head: Normocephalic and atraumatic.  Eyes: Conjunctivae are normal.  Neck: Neck supple.  Cardiovascular: Normal rate, regular rhythm and normal heart sounds.  Exam reveals no friction rub.   No murmur heard. Pulmonary/Chest: Effort normal and breath sounds normal. No respiratory distress. She has no wheezes. She has no rales.  Musculoskeletal:       Left knee: She exhibits decreased range of motion, swelling and effusion. She exhibits no ecchymosis, normal patellar mobility, no bony tenderness, normal meniscus and no MCL laxity. No tenderness found. No medial joint line, no lateral joint line, no MCL, no LCL and no patellar tendon tenderness noted.       Lumbar back: She exhibits decreased range of motion and pain. She exhibits no tenderness, no bony tenderness, no spasm and normal pulse.  Negative McMurry's Pain with extension and flexion Positive crepitus in her lt knee  Neurological: She is alert.  Skin: Skin is warm and dry.  Psychiatric: She has a normal mood and affect. Her behavior is normal.  Nursing note and vitals reviewed.  Results for orders placed or performed in visit on 08/16/16  POCT urine pregnancy  Result Value Ref Range   Preg Test, Ur Negative  Negative   Dg Lumbar Spine Complete  Result Date: 08/16/2016 CLINICAL DATA:  Bilateral low back pain without sciatica after prolonged standing EXAM: LUMBAR SPINE - COMPLETE 4+ VIEW COMPARISON:  02/19/2016 FINDINGS: Numbering of the  vertebral bodies as on prior assuming 5 non-rib-bearing lumbar vertebrae. The lumbar vertebral body heights are maintained without evidence of fracture or bone destruction. Moderate disc space narrowing is again noted at L2-3 with degenerative discogenic sclerosis of the endplates and anterior osteophytes. Retrolisthesis of L3 on L4 is unchanged measuring approximately 3 mm. There is L4-5 and L5-S1 facet hypertrophy and sclerosis consistent with degenerative facet arthropathy with probable mild L5-S1 neural foraminal stenosis. No spondylolysis nor spondylolisthesis. 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 Stable 3 mm of retrolisthesis of L3 on L4. L4-5 and L5-S1 facet arthropathy with probable mild L5-S1 foraminal stenosis Electronically Signed   By: Tollie Eth M.D.   On: 08/16/2016 13:09   Dg Knee Complete 4 Views Left  Result Date: 08/16/2016 CLINICAL DATA:  Left knee pain for 1 week EXAM: LEFT KNEE - COMPLETE 4+ VIEW COMPARISON:  MRI from 06/14/2010 FINDINGS: Moderate suprapatellar joint effusion. Slight joint space narrowing of the medial femorotibial compartment. No acute fracture, bone destruction or malalignment. Soft tissues are unremarkable. IMPRESSION: Moderate suprapatellar joint effusion. No acute osseous abnormality. Electronically Signed   By: Tollie Eth M.D.   On: 08/16/2016 13:00       Assessment & Plan:   1. Bilateral low back pain without sciatica   2. Left anterior knee pain   3. Menorrhagia with irregular cycle   4. Need for prophylactic vaccination and inoculation against influenza   5. Vitamin D deficiency    -New. -lumbar spine films reveal DDD lumbar spine; rx for Celebrex provided; recommend Flexeril qhs.  Recommend heat to area; recommend daily home exercise program daily. -L knee pain/strain; concern for underlying meniscus etiology; rx for Celebrex provided; home exercise program provided as well.  Recommend icing bid  for the next week; if no improvement in two weeks, call for ortho referral. -irregular vaginal bleeding with FSH and LH WNL and not supportive of menopausal state; appointment this week with gynecology; suffering with intermittent pelvic pain as well. Warrants pelvic US and endometrial bx. -vitamin D level persistently low; refill of Vitamin D provided.   Orders Placed This Encounter  Procedures  . DG Knee Complete 4 Views Left    Standing Status:   Future    Number of Occurrences:   1    Standing Expiration Date:   08/16/2017    Order Specific Question:   Reason for Exam (SYMPTOM  OR DIAGNOSIS REQUIRED)    Answer:   low back pain B after prolonged standing    Order Specific Question:   Is the patient pregnant?    Answer:   No    Order Specific Question:   Preferred imaging location?    Answer:   External  . DG Lumbar Spine Complete    Standing Status:   Future    Number of Occurrences:   1    Standing Expiration Date:   08/16/2017    Order Specific Question:   Reason for Exam (SYMPTOM  OR DIAGNOSIS REQUIRED)    Answer:   low back pain B after prolonged standing    Order Specific Question:   Is the patient pregnant?    Answer:   No    Order Specific Question:   Preferred imaging location?  Answer:   External  . Flu Vaccine QUAD 36+ mos IM  . POCT urine pregnancy   Meds ordered this encounter  Medications  . Vitamin D, Ergocalciferol, (DRISDOL) 50000 units CAPS capsule    Sig: Take 1 capsule (50,000 Units total) by mouth every 7 (seven) days.    Dispense:  12 capsule    Refill:  1  . atorvastatin (LIPITOR) 10 MG tablet    Sig: Take 1 tablet (10 mg total) by mouth daily.    Dispense:  90 tablet    Refill:  3  . celecoxib (CELEBREX) 200 MG capsule    Sig: Take 1 capsule (200 mg total) by mouth 2 (two) times daily.    Dispense:  45 capsule    Refill:  0    No Follow-up on file.  I personally performed the services described in this documentation, which was scribed in my  presence. The recorded information has been reviewed and considered.  Ekaterina Denise Paulita Fujita, M.D. [facility] [address] [place], [place]  [address] [phone] phone [phone] fax

## 2016-09-10 ENCOUNTER — Other Ambulatory Visit: Payer: Self-pay

## 2016-09-10 NOTE — Telephone Encounter (Signed)
Last ov 07/2016 Last tsh 07/2015 0.898

## 2016-09-12 MED ORDER — LEVOTHYROXINE SODIUM 100 MCG PO TABS: 100.0000 ug | ORAL_TABLET | Freq: Every day | ORAL | 3 refills | Status: DC

## 2016-09-24 ENCOUNTER — Encounter: Payer: BC Managed Care – PPO | Admitting: Family Medicine

## 2016-09-24 ENCOUNTER — Telehealth: Payer: Self-pay

## 2016-09-24 NOTE — Telephone Encounter (Signed)
08/16/16 last ov and refill  06/2016 last labs

## 2016-09-26 MED ORDER — CELECOXIB 200 MG PO CAPS: 200.0000 mg | ORAL_CAPSULE | Freq: Two times a day (BID) | ORAL | 0 refills | Status: DC

## 2016-09-26 NOTE — Addendum Note (Signed)
Addended by: Ethelda ChickSMITH, Ayva Veilleux M on: 09/26/2016 11:41 AM   Modules accepted: Orders

## 2016-10-01 ENCOUNTER — Other Ambulatory Visit: Payer: Self-pay

## 2016-10-01 ENCOUNTER — Encounter: Payer: Self-pay | Admitting: Family Medicine

## 2016-10-01 ENCOUNTER — Ambulatory Visit (INDEPENDENT_AMBULATORY_CARE_PROVIDER_SITE_OTHER): Payer: BC Managed Care – PPO | Admitting: Family Medicine

## 2016-10-01 VITALS — BP 124/86 | HR 81 | Temp 98.2°F | Resp 18 | Ht 65.5 in | Wt 276.8 lb

## 2016-10-01 DIAGNOSIS — J0101 Acute recurrent maxillary sinusitis: Secondary | ICD-10-CM

## 2016-10-01 DIAGNOSIS — M5136 Other intervertebral disc degeneration, lumbar region: Secondary | ICD-10-CM | POA: Diagnosis not present

## 2016-10-01 DIAGNOSIS — E1142 Type 2 diabetes mellitus with diabetic polyneuropathy: Secondary | ICD-10-CM | POA: Diagnosis not present

## 2016-10-01 DIAGNOSIS — J301 Allergic rhinitis due to pollen: Secondary | ICD-10-CM | POA: Diagnosis not present

## 2016-10-01 DIAGNOSIS — I10 Essential (primary) hypertension: Secondary | ICD-10-CM

## 2016-10-01 LAB — POCT GLYCOSYLATED HEMOGLOBIN (HGB A1C): Hemoglobin A1C: 7.9

## 2016-10-01 LAB — GLUCOSE, POCT (MANUAL RESULT ENTRY): POC Glucose: 181 mg/dl — AB (ref 70–99)

## 2016-10-01 MED ORDER — METHOCARBAMOL 500 MG PO TABS: 500.0000 mg | ORAL_TABLET | Freq: Every evening | ORAL | 0 refills | Status: DC | PRN

## 2016-10-01 MED ORDER — INSULIN GLARGINE 100 UNIT/ML SOLOSTAR PEN: 46.0000 [IU] | PEN_INJECTOR | Freq: Every day | SUBCUTANEOUS | 11 refills | Status: DC

## 2016-10-01 MED ORDER — DOXYCYCLINE HYCLATE 100 MG PO CAPS: 100.0000 mg | ORAL_CAPSULE | Freq: Two times a day (BID) | ORAL | 0 refills | Status: DC

## 2016-10-01 NOTE — Progress Notes (Signed)
Subjective:    Patient ID: Rachel Walls, female    DOB: 09/12/1966, 50 y.o.   MRN: [id number]  10/01/2016  Follow-up (diabetes follow up)   HPI This 50 y.o. female presents for three month follow-up for DMII, sinus congestion, hypercholesterolemia.  Just finished diabetic education with husband; was a good refresher for patient and husband.  Blood sugars are not controlled.  Husband is doing much better.   HgbA1c 8.1.  Sugars are still everywhere; highest sugar lately at 299.  Has also been in a lot of pain and a lot of stress.  Has alos missed five days out of school due to acute illness.   Fasting sugars running: 120-299. School is very stressful this year; not having fun at work. Moved to 7th grade who are a whole new breed. No control of them. Jardiance, Glipizide, Lantus 36units for now; Metformin.  Low back Pain: celebrex working really great for pain during the school day.  Having a horrible sharp pain in lower back.  Feels like having the wind knocked out of pt. After one hour, loosens up.  Celebrex does not work through the night and has nighttime awakening. Also helping knees.   Also having R ankle sprain; having pain again.  Ear pain: has been suffering with ear pain and head congestion for the past two weeks.  No fever/chills/sweats.  Wt Readings from Last 3 Encounters:  10/01/16 276 lb 12.8 oz (125.6 kg)  08/16/16 280 lb (127 kg)  06/24/16 277 lb 12.8 oz (126 kg)   BP Readings from Last 3 Encounters:  10/01/16 124/86  08/16/16 130/80  06/24/16 116/78   Immunization History  Administered Date(s) Administered  . Hepatitis A, Adult 09/28/2013, 05/29/2014  . Hepatitis B 03/16/2013  . Hepatitis B, adult 06/27/2013, 09/28/2013  . Influenza Split 08/06/2010  . Influenza,inj,Quad PF,36+ Mos 09/28/2013, 08/28/2014, 08/10/2015, 08/16/2016  . Pneumococcal Conjugate-13 11/01/2014  . Pneumococcal Polysaccharide-23 11/24/1998, 09/28/2013  . Tdap 01/18/2011    Review of  Systems  Constitutional: Negative for chills, diaphoresis, fatigue and fever.  HENT: Positive for congestion, ear pain, postnasal drip, rhinorrhea and voice change. Negative for ear discharge, sinus pain, sinus pressure and trouble swallowing.   Eyes: Negative for visual disturbance.  Respiratory: Negative for cough and shortness of breath.   Cardiovascular: Negative for chest pain, palpitations and leg swelling.  Gastrointestinal: Negative for abdominal pain, constipation, diarrhea, nausea and vomiting.  Endocrine: Negative for cold intolerance, heat intolerance, polydipsia, polyphagia and polyuria.  Musculoskeletal: Positive for arthralgias, back pain, joint swelling, myalgias, neck pain and neck stiffness.  Neurological: Negative for dizziness, tremors, seizures, syncope, facial asymmetry, speech difficulty, weakness, light-headedness, numbness and headaches.  Psychiatric/Behavioral: Positive for dysphoric mood. The patient is nervous/anxious.     Past Medical History:  Diagnosis Date  . Allergic rhinitis, cause unspecified   . Anemia   . Anxiety   . Arthritis   . Asthma   . Chronic kidney disease   . Diabetes mellitus    Type 2  . GERD (gastroesophageal reflux disease)   . Hypertension   . Hypertensive retinopathy   . Hypothyroidism   . Nonspecific elevation of levels of transaminase or lactic acid dehydrogenase (LDH)   . Obesity, unspecified   . Other and unspecified hyperlipidemia   . Psychic factors associated with disease    Psychogenic factors with other diseases  . Unspecified vitamin D deficiency   . Venous engorgement of retina   . Wegener's granulomatosis (HCC)  Past Surgical History:  Procedure Laterality Date  . abdominal ultrasound  05/17/2012   severe hepatomegaly at 22 cm; gallbladder normal.   [facility].  . Calcaneous bone spur surgery     x2  . CT abdomen  06/10/12   Hepatomegaly.  [facility].  [facility]. ESOPHAGOGASTRODUODENOSCOPY  08/19/12   diffuse gastritis antrum;  pathology negative for malignancy, H. Pylori; esophagus and duodenum normal.  . hida scan  08/27/12   normal; EF 62%.  [facility].  [facility]. LUNG BIOPSY  1999  . Meniscus tear     L; x 3 tears.  [facility]  [facility] Kitchen. RENAL BIOPSY  1999  . 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 TONSILLECTOMY     Allergies  Allergen Reactions  . Penicillins Hives  . Sulfonamide Derivatives     Reaction: arthralgias.    Social History   Social History  . Marital status: Married    Spouse name: N/A  . Number of children: 0  . Years of education: college   Occupational History  . 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 Smoking status: Never Smoker  . Smokeless tobacco: Never Used     Comment: Tobacco use-no  . Alcohol use No  . Drug use: No  . Sexual activity: Yes    Partners: Male   Other Topics Concern  . Not on file   Social History Narrative   Married x 25 years;happily married; no abuse.      Children: none      Lives: with husband, 3 cats, 1 dog.      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      Tobacco: never      Alcohol: never      Drugs: none      Exercise: none      Seatbelt: 100%      Guns: none             Family History  Problem Relation Age of Onset  . Hyperlipidemia Mother     Hypercholesterolemia  . Arthritis Mother     OA  . Sarcoidosis Father   . Heart disease Maternal Grandfather   . Emphysema Maternal Grandfather     Cause of death  . Diabetes Maternal Grandfather   . Heart failure Paternal Grandfather     CHF  . Heart disease Paternal Grandfather   . Asthma Brother   . Alcohol abuse Brother   . Emphysema Maternal Grandmother        Objective:    BP 124/86   Pulse 81   Temp 98.2 F (36.8 C) (Oral)   Resp 18   Ht 5' 5.5" (1.664 m)   Wt 276 lb 12.8 oz (125.6 kg)   LMP 06/24/2015 Comment: per patient periods just decided to stop  SpO2 99%   BMI 45.36  kg/m  Physical Exam  Constitutional: She is oriented to person, place, and time. She appears well-developed and well-nourished. No distress.  HENT:  Head: Normocephalic and atraumatic.  Right Ear: Tympanic membrane, external ear and ear canal normal.  Left Ear: Tympanic membrane, external ear and ear canal normal.  Nose: Nose normal. Right sinus exhibits no maxillary sinus tenderness and no frontal sinus tenderness. Left sinus exhibits no maxillary sinus tenderness and no frontal sinus tenderness.  Mouth/Throat: Uvula is midline, oropharynx is clear and moist and mucous membranes  are normal.  Eyes: Conjunctivae and EOM are normal. Pupils are equal, round, and reactive to light.  Neck: Normal range of motion. Neck supple. Carotid bruit is not present. No thyromegaly present.  Cardiovascular: Normal rate, regular rhythm, normal heart sounds and intact distal pulses.  Exam reveals no gallop and no friction rub.   No murmur heard. Pulmonary/Chest: Effort normal and breath sounds normal. She has no wheezes. She has no rales.  Abdominal: Soft. Bowel sounds are normal. She exhibits no distension and no mass. There is no tenderness. There is no rebound and no guarding.  Lymphadenopathy:    She has no cervical adenopathy.  Neurological: She is alert and oriented to person, place, and time. No cranial nerve deficit.  Skin: Skin is warm and dry. No rash noted. She is not diaphoretic. No erythema. No pallor.  Psychiatric: She has a normal mood and affect. Her behavior is normal.   Results for orders placed or performed in visit on 10/01/16  CBC with Differential/Platelet  Result Value Ref Range   WBC 11.5 (H) 3.8 - 10.8 K/uL   RBC 5.21 (H) 3.80 - 5.10 MIL/uL   Hemoglobin 11.3 (L) 11.7 - 15.5 g/dL   HCT 40.936.7 81.135.0 - 91.445.0 %   MCV 70.4 (L) 80.0 - 100.0 fL   MCH 21.7 (L) 27.0 - 33.0 pg   MCHC 30.8 (L) 32.0 - 36.0 g/dL   RDW 78.217.0 (H) 95.611.0 - 21.315.0 %   Platelets 421 (H) 140 - 400 K/uL   MPV 9.2 7.5 -  12.5 fL   Neutro Abs 7,820 (H) 1,500 - 7,800 cells/uL   Lymphs Abs 2,415 850 - 3,900 cells/uL   Monocytes Absolute 920 200 - 950 cells/uL   Eosinophils Absolute 345 15 - 500 cells/uL   Basophils Absolute 0 0 - 200 cells/uL   Neutrophils Relative % 68 %   Lymphocytes Relative 21 %   Monocytes Relative 8 %   Eosinophils Relative 3 %   Basophils Relative 0 %   Smear Review SEE NOTE   Comprehensive metabolic panel  Result Value Ref Range   Sodium 137 135 - 146 mmol/L   Potassium 4.1 3.5 - 5.3 mmol/L   Chloride 104 98 - 110 mmol/L   CO2 25 20 - 31 mmol/L   Glucose, Bld 179 (H) 65 - 99 mg/dL   BUN 23 7 - 25 mg/dL   Creat 0.861.04 5.780.50 - 4.691.05 mg/dL   Total Bilirubin 0.4 0.2 - 1.2 mg/dL   Alkaline Phosphatase 71 33 - 130 U/L   AST 11 10 - 35 U/L   ALT 12 6 - 29 U/L   Total Protein 6.9 6.1 - 8.1 g/dL   Albumin 4.0 3.6 - 5.1 g/dL   Calcium 9.1 8.6 - 62.910.4 mg/dL  POCT glucose (manual entry)  Result Value Ref Range   POC Glucose 181 (A) 70 - 99 mg/dl  POCT glycosylated hemoglobin (Hb A1C)  Result Value Ref Range   Hemoglobin A1C 7.9        Assessment & Plan:   1. Diabetic peripheral neuropathy (HCC)   2. Essential hypertension, benign   3. Chronic seasonal allergic rhinitis due to pollen   4. Degenerative disc disease, lumbar   5. Acute recurrent maxillary sinusitis    -diabetes uncontrolled; increase Levemir to 46 units nightly; recommend exercise, weight loss, and low-carbohydrate diet.  -persistent and worsening lower back pain; refer to ortho for further management.  Continue with home exercise program, Celebrex, Robaxin. -rx  for Doxycycline provided for acute sinusitis secondary to allergic rhinitis.   Orders Placed This Encounter  Procedures  . CBC with Differential/Platelet  . Comprehensive metabolic panel  . Ambulatory referral to Orthopedic Surgery    Referral Priority:   Routine    Referral Type:   Surgical    Referral Reason:   Specialty Services Required     Requested Specialty:   Orthopedic Surgery    Number of Visits Requested:   1  . POCT glucose (manual entry)  . POCT glycosylated hemoglobin (Hb A1C)   Meds ordered this encounter  Medications  . methocarbamol (ROBAXIN) 500 MG tablet    Sig: Take 1-2 tablets (500-1,000 mg total) by mouth at bedtime as needed for muscle spasms.    Dispense:  60 tablet    Refill:  0  . DISCONTD: Insulin Glargine (LANTUS) 100 UNIT/ML Solostar Pen    Sig: Inject 46 Units into the skin daily at 10 pm.    Dispense:  15 mL    Refill:  11  . doxycycline (VIBRAMYCIN) 100 MG capsule    Sig: Take 1 capsule (100 mg total) by mouth 2 (two) times daily.    Dispense:  20 capsule    Refill:  0    No Follow-up on file.   Lacee Grey Paulita Fujita, M.D. [facility] [address] [place], [place]  [id number] [phone] phone [phone] fax

## 2016-10-01 NOTE — Telephone Encounter (Signed)
Last ov 07/2016 10/01/16 next ov

## 2016-10-01 NOTE — Patient Instructions (Signed)
     IF you received an x-ray today, you will receive an invoice from [facility]. Please contact  Radiology at [phone] with questions or concerns regarding your invoice.   IF you received labwork today, you will receive an invoice from [facility]. Please contact [facility] at [phone] with questions or concerns regarding your invoice.   Our billing staff will not be able to assist you with questions regarding bills from these companies.  You will be contacted with the lab results as soon as they are available. The fastest way to get your results is to activate your My Chart account. Instructions are located on the last page of this paperwork. If you have not heard from us regarding the results in 2 weeks, please contact this office.      

## 2016-10-02 ENCOUNTER — Telehealth: Payer: Self-pay

## 2016-10-02 LAB — COMPREHENSIVE METABOLIC PANEL
ALBUMIN: 4 g/dL (ref 3.6–5.1)
ALT: 12 U/L (ref 6–29)
AST: 11 U/L (ref 10–35)
Alkaline Phosphatase: 71 U/L (ref 33–130)
BUN: 23 mg/dL (ref 7–25)
CHLORIDE: 104 mmol/L (ref 98–110)
CO2: 25 mmol/L (ref 20–31)
Calcium: 9.1 mg/dL (ref 8.6–10.4)
Creat: 1.04 mg/dL (ref 0.50–1.05)
Glucose, Bld: 179 mg/dL — ABNORMAL HIGH (ref 65–99)
POTASSIUM: 4.1 mmol/L (ref 3.5–5.3)
Sodium: 137 mmol/L (ref 135–146)
Total Bilirubin: 0.4 mg/dL (ref 0.2–1.2)
Total Protein: 6.9 g/dL (ref 6.1–8.1)

## 2016-10-02 LAB — CBC WITH DIFFERENTIAL/PLATELET
Basophils Absolute: 0 {cells}/uL (ref 0–200)
Basophils Relative: 0 %
Eosinophils Absolute: 345 {cells}/uL (ref 15–500)
Eosinophils Relative: 3 %
HCT: 36.7 % (ref 35.0–45.0)
Hemoglobin: 11.3 g/dL — ABNORMAL LOW (ref 11.7–15.5)
Lymphocytes Relative: 21 %
Lymphs Abs: 2415 {cells}/uL (ref 850–3900)
MCH: 21.7 pg — ABNORMAL LOW (ref 27.0–33.0)
MCHC: 30.8 g/dL — ABNORMAL LOW (ref 32.0–36.0)
MCV: 70.4 fL — ABNORMAL LOW (ref 80.0–100.0)
MPV: 9.2 fL (ref 7.5–12.5)
Monocytes Absolute: 920 {cells}/uL (ref 200–950)
Monocytes Relative: 8 %
Neutro Abs: 7820 {cells}/uL — ABNORMAL HIGH (ref 1500–7800)
Neutrophils Relative %: 68 %
Platelets: 421 10*3/uL — ABNORMAL HIGH (ref 140–400)
RBC: 5.21 MIL/uL — ABNORMAL HIGH (ref 3.80–5.10)
RDW: 17 % — ABNORMAL HIGH (ref 11.0–15.0)
WBC: 11.5 10*3/uL — ABNORMAL HIGH (ref 3.8–10.8)

## 2016-10-02 MED ORDER — GLUCOSE BLOOD VI STRP: ORAL_STRIP | 11 refills | Status: DC

## 2016-10-02 MED ORDER — LANSOPRAZOLE 30 MG PO CPDR: 30.0000 mg | DELAYED_RELEASE_CAPSULE | Freq: Every day | ORAL | 3 refills | Status: DC

## 2016-10-02 NOTE — Telephone Encounter (Signed)
Pharm faxed notice that Lantus is not covered by ins. I checked formulary and looks like her plan prefers [facility]. Do you want to send in a Rx? Pended Rx w/same sig and quantity as Lantus. I will also check for coupon for Basaglar and leave one for pt is so.

## 2016-10-06 MED ORDER — BASAGLAR KWIKPEN 100 UNIT/ML ~~LOC~~ SOPN: PEN_INJECTOR | SUBCUTANEOUS | 11 refills | Status: DC

## 2016-10-06 NOTE — Telephone Encounter (Signed)
Basaglar rx approved and escribed.

## 2016-10-06 NOTE — Telephone Encounter (Signed)
Notified pt on VM of new Rx, advised it should work the same as Lantus but to let us know if not. Also left savings card for pt and notified her of that on VM also.

## 2016-10-08 ENCOUNTER — Other Ambulatory Visit: Payer: Self-pay

## 2016-10-08 MED ORDER — GLIPIZIDE ER 10 MG PO TB24: 10.0000 mg | ORAL_TABLET | Freq: Every day | ORAL | 1 refills | Status: DC

## 2016-11-03 ENCOUNTER — Telehealth: Payer: Self-pay | Admitting: Family Medicine

## 2016-11-03 MED ORDER — CELECOXIB 200 MG PO CAPS: 200.0000 mg | ORAL_CAPSULE | Freq: Two times a day (BID) | ORAL | 0 refills | Status: DC

## 2016-11-03 MED ORDER — DULOXETINE HCL 60 MG PO CPEP: 60.0000 mg | ORAL_CAPSULE | Freq: Two times a day (BID) | ORAL | 3 refills | Status: DC

## 2016-11-03 NOTE — Telephone Encounter (Signed)
Pt requesting refill of Celebrex and Duloxetine; [facility] regarding refills per patient with no response. Refills approved.

## 2016-11-13 ENCOUNTER — Telehealth: Payer: Self-pay | Admitting: Family Medicine

## 2016-11-13 ENCOUNTER — Other Ambulatory Visit: Payer: Self-pay | Admitting: Family Medicine

## 2016-11-13 MED ORDER — LANSOPRAZOLE 30 MG PO CPDR: 30.0000 mg | DELAYED_RELEASE_CAPSULE | Freq: Every day | ORAL | 3 refills | Status: DC

## 2016-11-13 NOTE — Telephone Encounter (Signed)
[facility] been going to [facility] and not [facility]; see MyChart message to patient with this feedback.

## 2016-11-13 NOTE — Progress Notes (Signed)
Received [facility]ill request [facility]id 30mg  [facility] [place].

## 2016-11-25 ENCOUNTER — Ambulatory Visit (INDEPENDENT_AMBULATORY_CARE_PROVIDER_SITE_OTHER): Payer: BC Managed Care – PPO | Admitting: Family Medicine

## 2016-11-25 ENCOUNTER — Encounter: Payer: Self-pay | Admitting: Family Medicine

## 2016-11-25 DIAGNOSIS — M3130 Wegener's granulomatosis without renal involvement: Secondary | ICD-10-CM

## 2016-11-25 DIAGNOSIS — Z0000 Encounter for general adult medical examination without abnormal findings: Secondary | ICD-10-CM | POA: Diagnosis not present

## 2016-11-25 DIAGNOSIS — E559 Vitamin D deficiency, unspecified: Secondary | ICD-10-CM | POA: Diagnosis not present

## 2016-11-25 DIAGNOSIS — E7800 Pure hypercholesterolemia, unspecified: Secondary | ICD-10-CM | POA: Diagnosis not present

## 2016-11-25 DIAGNOSIS — E1142 Type 2 diabetes mellitus with diabetic polyneuropathy: Secondary | ICD-10-CM

## 2016-11-25 DIAGNOSIS — I10 Essential (primary) hypertension: Secondary | ICD-10-CM | POA: Diagnosis not present

## 2016-11-25 DIAGNOSIS — J301 Allergic rhinitis due to pollen: Secondary | ICD-10-CM | POA: Diagnosis not present

## 2016-11-25 DIAGNOSIS — Z124 Encounter for screening for malignant neoplasm of cervix: Secondary | ICD-10-CM

## 2016-11-25 DIAGNOSIS — E034 Atrophy of thyroid (acquired): Secondary | ICD-10-CM | POA: Diagnosis not present

## 2016-11-25 DIAGNOSIS — E539 Vitamin B deficiency, unspecified: Secondary | ICD-10-CM | POA: Diagnosis not present

## 2016-11-25 DIAGNOSIS — E611 Iron deficiency: Secondary | ICD-10-CM | POA: Diagnosis not present

## 2016-11-25 DIAGNOSIS — F418 Other specified anxiety disorders: Secondary | ICD-10-CM | POA: Diagnosis not present

## 2016-11-25 DIAGNOSIS — Z794 Long term (current) use of insulin: Secondary | ICD-10-CM

## 2016-11-25 DIAGNOSIS — F32A Depression, unspecified: Secondary | ICD-10-CM

## 2016-11-25 DIAGNOSIS — F419 Anxiety disorder, unspecified: Secondary | ICD-10-CM

## 2016-11-25 DIAGNOSIS — F329 Major depressive disorder, single episode, unspecified: Secondary | ICD-10-CM

## 2016-11-25 DIAGNOSIS — R102 Pelvic and perineal pain: Secondary | ICD-10-CM | POA: Diagnosis not present

## 2016-11-25 DIAGNOSIS — J0101 Acute recurrent maxillary sinusitis: Secondary | ICD-10-CM

## 2016-11-25 LAB — POCT URINALYSIS DIP (MANUAL ENTRY)
BILIRUBIN UA: NEGATIVE
Ketones, POC UA: NEGATIVE
Leukocytes, UA: NEGATIVE
NITRITE UA: NEGATIVE
Protein Ur, POC: NEGATIVE
Urobilinogen, UA: 0.2
pH, UA: 5

## 2016-11-25 LAB — POCT GLYCOSYLATED HEMOGLOBIN (HGB A1C): HEMOGLOBIN A1C: 7.8

## 2016-11-25 MED ORDER — LEVOFLOXACIN 750 MG PO TABS: 750.0000 mg | ORAL_TABLET | Freq: Every day | ORAL | 0 refills | Status: DC

## 2016-11-25 NOTE — Progress Notes (Signed)
Subjective:    Patient ID: Rachel Walls, female    DOB: 10/12/1966, 51 y.o.   MRN: [id number]  11/25/2016  No chief complaint on file.   HPI This 51 y.o. female presents for Complete Physical Examination.  Last physical: 08-10-2015 Pap smear: 08-10-2015 Mammogram:  05-2016 Colonoscopy:  2012 Eye exam:  06-2015 Dental exam:  Immunization History  Administered Date(s) Administered  . Hepatitis A, Adult 09/28/2013, 05/29/2014  . Hepatitis B 03/16/2013  . Hepatitis B, adult 06/27/2013, 09/28/2013  . Influenza Split 08/06/2010  . Influenza,inj,Quad PF,36+ Mos 09/28/2013, 08/28/2014, 08/10/2015, 08/16/2016  . Pneumococcal Conjugate-13 11/01/2014  . Pneumococcal Polysaccharide-23 11/24/1998, 09/28/2013  . Tdap 01/18/2011   BP Readings from Last 3 Encounters:  10/01/16 124/86  08/16/16 130/80  06/24/16 116/78   Wt Readings from Last 3 Encounters:  10/01/16 276 lb 12.8 oz (125.6 kg)  08/16/16 280 lb (127 kg)  06/24/16 277 lb 12.8 oz (126 kg)    Acute sinusitis: on 11/10/16 started Doxycycline. Then evaluated by [facility] in [place]; still coughing; not any better.  Colors are brown/green/red.  Had fever with onset.  Then throat hurts also but not strep bumps but there are bumps back in oropharynx.  Sores on tongue as well but not sure if connected. ENT/[facility]  Did not keep appointment with gynecology; FSH and LH WNL. Continues to have pain on L side; occurs time of ovulation.  Duration of L pelvic pain for 3-4 days and resolves.     Review of Systems  Constitutional: Negative for activity change, appetite change, chills, diaphoresis, fatigue, fever and unexpected weight change.  HENT: Positive for congestion, sinus pain, sinus pressure, sore throat and voice change. Negative for dental problem, drooling, ear discharge, ear pain, facial swelling, hearing loss, mouth sores, nosebleeds, postnasal drip, rhinorrhea, sneezing, tinnitus and trouble swallowing.   Eyes: Negative for  photophobia, pain, discharge, redness, itching and visual disturbance.  Respiratory: Negative for apnea, cough, choking, chest tightness, shortness of breath, wheezing and stridor.   Cardiovascular: Negative for chest pain, palpitations and leg swelling.  Gastrointestinal: Negative for abdominal distention, abdominal pain, anal bleeding, blood in stool, constipation, diarrhea, nausea, rectal pain and vomiting.  Endocrine: Negative for cold intolerance, heat intolerance, polydipsia, polyphagia and polyuria.  Genitourinary: Positive for pelvic pain. Negative for decreased urine volume, difficulty urinating, dyspareunia, dysuria, enuresis, flank pain, frequency, genital sores, hematuria, menstrual problem, urgency, vaginal bleeding, vaginal discharge and vaginal pain.  Musculoskeletal: Negative for arthralgias, back pain, gait problem, joint swelling, myalgias, neck pain and neck stiffness.  Skin: Negative for color change, pallor, rash and wound.  Allergic/Immunologic: Negative for environmental allergies, food allergies and immunocompromised state.  Neurological: Negative for dizziness, tremors, seizures, syncope, facial asymmetry, speech difficulty, weakness, light-headedness, numbness and headaches.  Hematological: Negative for adenopathy. Does not bruise/bleed easily.  Psychiatric/Behavioral: Negative for agitation, behavioral problems, confusion, decreased concentration, dysphoric mood, hallucinations, self-injury, sleep disturbance and suicidal ideas. The patient is not nervous/anxious and is not hyperactive.     Past Medical History:  Diagnosis Date  . Allergic rhinitis, cause unspecified   . Anemia   . Anxiety   . Arthritis   . Asthma   . Chronic kidney disease   . Diabetes mellitus    Type 2  . GERD (gastroesophageal reflux disease)   . Hypertension   . Hypertensive retinopathy   . Hypothyroidism   . Nonspecific elevation of levels of transaminase or lactic acid dehydrogenase (LDH)    . Obesity, unspecified   .  Other and unspecified hyperlipidemia   . Psychic factors associated with disease    Psychogenic factors with other diseases  . Unspecified vitamin D deficiency   . Venous engorgement of retina   . Wegener's granulomatosis (HCC)    Past Surgical History:  Procedure Laterality Date  . abdominal ultrasound  05/17/2012   severe hepatomegaly at 22 cm; gallbladder normal.   [facility].  . Calcaneous bone spur surgery     x2  . CT abdomen  06/10/12   Hepatomegaly.  [facility].  Marland Kitchen ESOPHAGOGASTRODUODENOSCOPY  08/19/12   diffuse gastritis antrum; pathology negative for malignancy, H. Pylori; esophagus and duodenum normal.  . hida scan  08/27/12   normal; EF 62%.  [facility].  Marland Kitchen LUNG BIOPSY  1999  . Meniscus tear     L; x 3 tears.  [facility].  Marland Kitchen RENAL BIOPSY  1999  . 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 TONSILLECTOMY     Allergies  Allergen Reactions  . Penicillins Hives  . Sulfonamide Derivatives     Reaction: arthralgias.    Social History   Social History  . Marital status: Married    Spouse name: Renella Cunas  . Number of children: 0  . Years of education: college   Occupational History  . 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 Smoking status: Never Smoker  . Smokeless tobacco: Never Used     Comment: Tobacco use-no  . Alcohol use No  . Drug use: No  . Sexual activity: Yes    Partners: Male   Other Topics Concern  . Not on file   Social History Narrative   Marital status:  Married x 26 years;happily married; no abuse.      Children: none      Lives: with husband, 3 cats, 1 dog.      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      Tobacco: never      Alcohol: never      Drugs: none      Exercise: none      Seatbelt: 100%; no texting while driving.      Guns: none             Family History  Problem Relation Age of Onset  .  Hyperlipidemia Mother     Hypercholesterolemia  . Arthritis Mother     OA  . Sarcoidosis Father   . Heart disease Maternal Grandfather   . Emphysema Maternal Grandfather     Cause of death  . Diabetes Maternal Grandfather   . Heart failure Paternal Grandfather     CHF  . Heart disease Paternal Grandfather   . Asthma Brother   . Alcohol abuse Brother   . Emphysema Maternal Grandmother        Objective:    LMP 06/24/2015 Comment: per patient periods just decided to stop Physical Exam  Constitutional: She is oriented to person, place, and time. She appears well-developed and well-nourished. No distress.  HENT:  Head: Normocephalic and atraumatic.  Right Ear: External ear normal.  Left Ear: External ear normal.  Nose: Right sinus exhibits maxillary sinus tenderness and frontal sinus tenderness. Left sinus exhibits maxillary sinus tenderness. Left sinus exhibits no frontal sinus tenderness.  Mouth/Throat: Oropharynx is clear and moist.  Eyes: Conjunctivae and EOM are normal. Pupils are equal, round, and reactive  to light.  Neck: Normal range of motion and full passive range of motion without pain. Neck supple. No JVD present. Carotid bruit is not present. No thyromegaly present.  Cardiovascular: Normal rate, regular rhythm and normal heart sounds.  Exam reveals no gallop and no friction rub.   No murmur heard. Pulmonary/Chest: Effort normal and breath sounds normal. She has no wheezes. She has no rales. Right breast exhibits no inverted nipple, no mass, no nipple discharge, no skin change and no tenderness. Left breast exhibits no inverted nipple, no mass, no nipple discharge, no skin change and no tenderness. Breasts are symmetrical.  Abdominal: Soft. Bowel sounds are normal. She exhibits no distension and no mass. There is no tenderness. There is no rebound and no guarding.  Genitourinary: Vagina normal and uterus normal. There is no rash, tenderness, lesion or injury on the right  labia. There is no rash, tenderness or lesion on the left labia. Right adnexum displays no mass, no tenderness and no fullness. Left adnexum displays no mass, no tenderness and no fullness.  Musculoskeletal:       Right shoulder: Normal.       Left shoulder: Normal.       Cervical back: Normal.  Lymphadenopathy:    She has no cervical adenopathy.  Neurological: She is alert and oriented to person, place, and time. She has normal reflexes. No cranial nerve deficit. She exhibits normal muscle tone. Coordination normal.  Skin: Skin is warm and dry. No rash noted. She is not diaphoretic. No erythema. No pallor.  Psychiatric: She has a normal mood and affect. Her behavior is normal. Judgment and thought content normal.  Nursing note and vitals reviewed.  Results for orders placed or performed in visit on 11/25/16  POCT urinalysis dipstick  Result Value Ref Range   Color, UA yellow yellow   Clarity, UA clear clear   Glucose, UA =250 (A) negative   Bilirubin, UA negative negative   Ketones, POC UA negative negative   Spec Grav, UA <=1.005    Blood, UA small (A) negative   pH, UA 5.0    Protein Ur, POC negative negative   Urobilinogen, UA 0.2    Nitrite, UA Negative Negative   Leukocytes, UA Negative Negative   Depression screen [facility] 2/9 11/25/2016 11/25/2016 10/01/2016 08/16/2016 06/24/2016  Decreased Interest 0 0 0 0 0  Down, Depressed, Hopeless 0 0 0 0 0  PHQ - 2 Score 0 0 0 0 0       Assessment & Plan:   1. Routine physical examination   2. Essential hypertension, benign   3. Wegener's granulomatosis (HCC)   4. Chronic seasonal allergic rhinitis due to pollen   5. Hypothyroidism due to acquired atrophy of thyroid   6. Diabetic peripheral neuropathy (HCC)   7. Pure hypercholesterolemia   8. Vitamin D deficiency   9. Low iron   10. Vitamin B deficiency   11. Anxiety and depression   12. Type 2 diabetes mellitus with diabetic polyneuropathy, with long-term current use of insulin (HCC)    13. Morbid obesity (HCC)   14. Pelvic pain in female   105. Cervical cancer screening   16. Acute recurrent maxillary sinusitis    -anticipatory guidance ---weight loss, exercise, 3 servings calcium daily, low calorie food choices. -due to ongoing L pelvic pain, refer for pelvic US; non-compliant with gynecology consultation. -obtain labs for chronic disease management. -pap smear obtained. -rx for Levaquin for sinusitis. -continue current medications.   Orders  Placed This Encounter  Procedures  . US Pelvis Complete    Standing Status:   Future    Standing Expiration Date:   01/23/2018    Order Specific Question:   Reason for Exam (SYMPTOM  OR DIAGNOSIS REQUIRED)    Answer:   L pelvic pain    Order Specific Question:   Preferred imaging location?    Answer:   [facility]  . CBC with Differential/Platelet  . Comprehensive metabolic panel    Order Specific Question:   Has the patient fasted?    Answer:   Yes  . TSH  . T4, free  . Lipid panel    Order Specific Question:   Has the patient fasted?    Answer:   Yes  . Microalbumin, urine  . Vitamin B12  . VITAMIN D 25 Hydroxy (Vit-D Deficiency, Fractures)  . POCT urinalysis dipstick  . POCT glycosylated hemoglobin (Hb A1C)  . EKG 12-Lead   Meds ordered this encounter  Medications  . levofloxacin (LEVAQUIN) 750 MG tablet    Sig: Take 1 tablet (750 mg total) by mouth daily.    Dispense:  10 tablet    Refill:  0    Return in about 3 months (around 02/23/2017) for recheck diabetes.   Mick Tanguma Paulita FujitaMartin Winda Summerall, M.D. [facility]  [place] [address] [place], [place]  [id number] [phone] phone [phone] fax

## 2016-11-25 NOTE — Patient Instructions (Signed)
   IF you received an x-ray today, you will receive an invoice from [facility]. Please contact [facility] at [phone] with questions or concerns regarding your invoice.   IF you received labwork today, you will receive an invoice from [facility]. Please contact [facility] at [phone] with questions or concerns regarding your invoice.   Our billing staff will not be able to assist you with questions regarding bills from these companies.  You will be contacted with the lab results as soon as they are available. The fastest way to get your results is to activate your My Chart account. Instructions are located on the last page of this paperwork. If you have not heard from us regarding the results in 2 weeks, please contact this office.     Keeping You Healthy  Get These Tests  Blood Pressure- Have your blood pressure checked by your healthcare provider at least once a year.  Normal blood pressure is 120/80.  Weight- Have your body mass index (BMI) calculated to screen for obesity.  BMI is a measure of body fat based on height and weight.  You can calculate your own BMI at www.nhlbisupport.com/bmi/  Cholesterol- Have your cholesterol checked every year.  Diabetes- Have your blood sugar checked every year if you have high blood pressure, high cholesterol, a family history of diabetes or if you are overweight.  Pap Test - Have a pap test every 1 to 5 years if you have been sexually active.  If you are older than 65 and recent pap tests have been normal you may not need additional pap tests.  In addition, if you have had a hysterectomy  for benign disease additional pap tests are not necessary.  Mammogram-Yearly mammograms are essential for early detection of breast cancer  Screening for Colon Cancer- Colonoscopy starting at age 50. Screening may begin sooner depending on your family history and other health conditions.  Follow up colonoscopy as directed by your  Gastroenterologist.  Screening for Osteoporosis- Screening begins at age 65 with bone density scanning, sooner if you are at higher risk for developing Osteoporosis.  Get these medicines  Calcium with Vitamin D- Your body requires 1200-1500 mg of Calcium a day and 800-1000 IU of Vitamin D a day.  You can only absorb 500 mg of Calcium at a time therefore Calcium must be taken in 2 or 3 separate doses throughout the day.  Hormones- Hormone therapy has been associated with increased risk for certain cancers and heart disease.  Talk to your healthcare provider about if you need relief from menopausal symptoms.  Aspirin- Ask your healthcare provider about taking Aspirin to prevent Heart Disease and Stroke.  Get these Immuniztions  Flu shot- Every fall  Pneumonia shot- Once after the age of 65; if you are younger ask your healthcare provider if you need a pneumonia shot.  Tetanus- Every ten years.  Zostavax- Once after the age of 60 to prevent shingles.  Take these steps  Don't smoke- Your healthcare provider can help you quit. For tips on how to quit, ask your healthcare provider or go to www.smokefree.gov or call 1-800 QUIT-NOW.  Be physically active- Exercise 5 days a week for a minimum of 30 minutes.  If you are not already physically active, start slow and gradually work up to 30 minutes of moderate physical activity.  Try walking, dancing, bike riding, swimming, etc.  Eat a healthy diet- Eat a variety of healthy foods such as fruits, vegetables, whole grains, low   fat milk, low fat cheeses, yogurt, lean meats, chicken, fish, eggs, dried beans, tofu, etc.  For more information go to www.thenutritionsource.org  Dental visit- Brush and floss teeth twice daily; visit your dentist twice a year.  Eye exam- Visit your Optometrist or Ophthalmologist yearly.  Drink alcohol in moderation- Limit alcohol intake to one drink or less a day.  Never drink and drive.  Depression- Your emotional  health is as important as your physical health.  If you're feeling down or losing interest in things you normally enjoy, please talk to your healthcare provider.  Seat Belts- can save your life; always wear one  Smoke/Carbon Monoxide detectors- These detectors need to be installed on the appropriate level of your home.  Replace batteries at least once a year.  Violence- If anyone is threatening or hurting you, please tell your healthcare provider.  Living Will/ Health care power of attorney- Discuss with your healthcare provider and family.  

## 2016-11-26 LAB — COMPREHENSIVE METABOLIC PANEL
ALT: 12 IU/L (ref 0–32)
AST: 11 IU/L (ref 0–40)
Albumin/Globulin Ratio: 1.5 (ref 1.2–2.2)
Albumin: 4.3 g/dL (ref 3.5–5.5)
Alkaline Phosphatase: 94 IU/L (ref 39–117)
BILIRUBIN TOTAL: 0.3 mg/dL (ref 0.0–1.2)
BUN/Creatinine Ratio: 21 (ref 9–23)
BUN: 18 mg/dL (ref 6–24)
CALCIUM: 9.4 mg/dL (ref 8.7–10.2)
CHLORIDE: 101 mmol/L (ref 96–106)
CO2: 25 mmol/L (ref 18–29)
Creatinine, Ser: 0.86 mg/dL (ref 0.57–1.00)
GFR, EST AFRICAN AMERICAN: 90 mL/min/{1.73_m2} (ref >59)
GFR, EST NON AFRICAN AMERICAN: 78 mL/min/{1.73_m2} (ref >59)
GLOBULIN, TOTAL: 2.8 g/dL (ref 1.5–4.5)
Glucose: 69 mg/dL (ref 65–99)
POTASSIUM: 4.6 mmol/L (ref 3.5–5.2)
SODIUM: 143 mmol/L (ref 134–144)
TOTAL PROTEIN: 7.1 g/dL (ref 6.0–8.5)

## 2016-11-26 LAB — CBC WITH DIFFERENTIAL/PLATELET
BASOS: 0 %
Basophils Absolute: 0 10*3/uL (ref 0.0–0.2)
EOS (ABSOLUTE): 0.3 10*3/uL (ref 0.0–0.4)
EOS: 3 %
Hematocrit: 38.9 % (ref 34.0–46.6)
Hemoglobin: 11.9 g/dL (ref 11.1–15.9)
IMMATURE GRANULOCYTES: 0 %
Immature Grans (Abs): 0 10*3/uL (ref 0.0–0.1)
Lymphocytes Absolute: 2.2 10*3/uL (ref 0.7–3.1)
Lymphs: 21 %
MCH: 21.4 pg — ABNORMAL LOW (ref 26.6–33.0)
MCHC: 30.6 g/dL — ABNORMAL LOW (ref 31.5–35.7)
MCV: 70 fL — ABNORMAL LOW (ref 79–97)
MONOS ABS: 1 10*3/uL — AB (ref 0.1–0.9)
Monocytes: 9 %
NEUTROS PCT: 67 %
Neutrophils Absolute: 7.2 10*3/uL — ABNORMAL HIGH (ref 1.4–7.0)
PLATELETS: 397 10*3/uL — AB (ref 150–379)
RBC: 5.56 x10E6/uL — ABNORMAL HIGH (ref 3.77–5.28)
RDW: 17.7 % — AB (ref 12.3–15.4)
WBC: 10.8 10*3/uL (ref 3.4–10.8)

## 2016-11-26 LAB — VITAMIN B12: Vitamin B-12: 303 pg/mL (ref 232–1245)

## 2016-11-26 LAB — T4, FREE: Free T4: 1.35 ng/dL (ref 0.82–1.77)

## 2016-11-26 LAB — LIPID PANEL
Chol/HDL Ratio: 2.7 ratio units (ref 0.0–4.4)
Cholesterol, Total: 158 mg/dL (ref 100–199)
HDL: 58 mg/dL (ref >39)
LDL CALC: 74 mg/dL (ref 0–99)
Triglycerides: 130 mg/dL (ref 0–149)
VLDL CHOLESTEROL CAL: 26 mg/dL (ref 5–40)

## 2016-11-26 LAB — TSH: TSH: 1.84 u[IU]/mL (ref 0.450–4.500)

## 2016-11-26 LAB — MICROALBUMIN, URINE: MICROALBUM., U, RANDOM: 370 ug/mL

## 2016-11-26 LAB — VITAMIN D 25 HYDROXY (VIT D DEFICIENCY, FRACTURES): Vit D, 25-Hydroxy: 35.2 ng/mL (ref 30.0–100.0)

## 2016-11-28 ENCOUNTER — Encounter: Payer: Self-pay | Admitting: Family Medicine

## 2016-11-28 LAB — PAP IG, CT-NG, RFX HPV ASCU
CHLAMYDIA, NUC. ACID AMP: NEGATIVE
GONOCOCCUS BY NUCLEIC ACID AMP: NEGATIVE
PAP Smear Comment: 0

## 2016-12-02 ENCOUNTER — Other Ambulatory Visit: Payer: Self-pay | Admitting: *Deleted

## 2016-12-02 DIAGNOSIS — E1142 Type 2 diabetes mellitus with diabetic polyneuropathy: Secondary | ICD-10-CM

## 2016-12-02 MED ORDER — GABAPENTIN 800 MG PO TABS: 800.0000 mg | ORAL_TABLET | Freq: Three times a day (TID) | ORAL | 0 refills | Status: DC

## 2016-12-13 ENCOUNTER — Other Ambulatory Visit: Payer: Self-pay

## 2016-12-13 MED ORDER — EMPAGLIFLOZIN 25 MG PO TABS: 25.0000 mg | ORAL_TABLET | Freq: Every day | ORAL | 2 refills | Status: DC

## 2016-12-13 NOTE — Telephone Encounter (Signed)
[facility] [address] [facility] [place] -jardiance Pt returning in 3 mo - sent 3 mo refills

## 2017-01-10 ENCOUNTER — Other Ambulatory Visit: Payer: Self-pay

## 2017-01-10 DIAGNOSIS — E1142 Type 2 diabetes mellitus with diabetic polyneuropathy: Secondary | ICD-10-CM

## 2017-01-10 MED ORDER — METFORMIN HCL 500 MG PO TABS: 500.0000 mg | ORAL_TABLET | Freq: Every evening | ORAL | 1 refills | Status: DC

## 2017-01-12 ENCOUNTER — Encounter: Payer: Self-pay | Admitting: Family Medicine

## 2017-02-13 ENCOUNTER — Other Ambulatory Visit: Payer: Self-pay

## 2017-02-13 MED ORDER — GABAPENTIN 800 MG PO TABS: 800.0000 mg | ORAL_TABLET | Freq: Three times a day (TID) | ORAL | 0 refills | Status: DC

## 2017-02-13 NOTE — Telephone Encounter (Signed)
[facility]ax [facility] [address] [place] Gabapentin 800mg   Sent #90 = 30 day supply with -0-RF-Pt has appt 02/2017

## 2017-02-25 ENCOUNTER — Other Ambulatory Visit: Payer: Self-pay | Admitting: Family Medicine

## 2017-02-25 ENCOUNTER — Ambulatory Visit (INDEPENDENT_AMBULATORY_CARE_PROVIDER_SITE_OTHER): Payer: BC Managed Care – PPO | Admitting: Family Medicine

## 2017-02-25 ENCOUNTER — Encounter: Payer: Self-pay | Admitting: Family Medicine

## 2017-02-25 VITALS — BP 129/88 | HR 101 | Temp 98.9°F | Resp 16 | Ht 65.0 in | Wt 277.2 lb

## 2017-02-25 DIAGNOSIS — E1142 Type 2 diabetes mellitus with diabetic polyneuropathy: Secondary | ICD-10-CM

## 2017-02-25 DIAGNOSIS — E559 Vitamin D deficiency, unspecified: Secondary | ICD-10-CM | POA: Diagnosis not present

## 2017-02-25 DIAGNOSIS — E611 Iron deficiency: Secondary | ICD-10-CM | POA: Diagnosis not present

## 2017-02-25 DIAGNOSIS — E7800 Pure hypercholesterolemia, unspecified: Secondary | ICD-10-CM | POA: Diagnosis not present

## 2017-02-25 DIAGNOSIS — I10 Essential (primary) hypertension: Secondary | ICD-10-CM

## 2017-02-25 LAB — POCT GLYCOSYLATED HEMOGLOBIN (HGB A1C): Hemoglobin A1C: 8.1

## 2017-02-25 LAB — GLUCOSE, POCT (MANUAL RESULT ENTRY): POC GLUCOSE: 228 mg/dL — AB (ref 70–99)

## 2017-02-25 MED ORDER — LIRAGLUTIDE 18 MG/3ML ~~LOC~~ SOPN: 0.6000 mg | PEN_INJECTOR | Freq: Every morning | SUBCUTANEOUS | 99 refills | Status: DC

## 2017-02-25 NOTE — Patient Instructions (Signed)
     IF you received an x-ray today, you will receive an invoice from [facility]. Please contact [facility] at [phone] with questions or concerns regarding your invoice.   IF you received labwork today, you will receive an invoice from [facility]. Please contact [facility] at [phone] with questions or concerns regarding your invoice.   Our billing staff will not be able to assist you with questions regarding bills from these companies.  You will be contacted with the lab results as soon as they are available. The fastest way to get your results is to activate your My Chart account. Instructions are located on the last page of this paperwork. If you have not heard from us regarding the results in 2 weeks, please contact this office.     

## 2017-02-25 NOTE — Progress Notes (Signed)
Subjective:    Patient ID: Rachel Walls, female    DOB: 05/15/1966, 51 y.o.   MRN: [id number]  02/25/2017  Follow-up (DIABETES and a1c)   HPI This 51 y.o. female presents for three month follow-up of DMII, hypertension, hypercholesterolemia.Patient reports good compliance with medication, good tolerance to medication, and good symptom control.   Has a lot of pain.  Part of way of life. Hurts everywhere; knee, back, L forearm.  Husband is far worse than pt.  Husband gets injection tomorrow in neck.    Sugars running in middle probably; not awesome yet not too bad.  Checking sugars every morning.  Never checks during evenings.   Not checking blood pressure at home. basaglar 56 units.   Nocturia x 2-3.  Less water during the day; in evenings, drinks excessively.   Intermittent walking; does not like warm weather.   Immunization History  Administered Date(s) Administered  . Hepatitis A, Adult 09/28/2013, 05/29/2014  . Hepatitis B 03/16/2013  . Hepatitis B, adult 06/27/2013, 09/28/2013  . Influenza Split 08/06/2010  . Influenza,inj,Quad PF,36+ Mos 09/28/2013, 08/28/2014, 08/10/2015, 08/16/2016  . Pneumococcal Conjugate-13 11/01/2014  . Pneumococcal Polysaccharide-23 11/24/1998, 09/28/2013  . Tdap 01/18/2011   BP Readings from Last 3 Encounters:  02/25/17 129/88  10/01/16 124/86  08/16/16 130/80   Wt Readings from Last 3 Encounters:  02/25/17 277 lb 3.2 oz (125.7 kg)  10/01/16 276 lb 12.8 oz (125.6 kg)  08/16/16 280 lb (127 kg)    Review of Systems  Constitutional: Negative for chills, diaphoresis, fatigue and fever.  Eyes: Negative for visual disturbance.  Respiratory: Negative for cough and shortness of breath.   Cardiovascular: Negative for chest pain, palpitations and leg swelling.  Gastrointestinal: Negative for abdominal pain, constipation, diarrhea, nausea and vomiting.  Endocrine: Negative for cold intolerance, heat intolerance, polydipsia, polyphagia and polyuria.   Musculoskeletal: Positive for arthralgias, back pain, gait problem and myalgias.  Neurological: Positive for numbness. Negative for dizziness, tremors, seizures, syncope, facial asymmetry, speech difficulty, weakness, light-headedness and headaches.  Psychiatric/Behavioral: Negative for dysphoric mood. The patient is not nervous/anxious.     Past Medical History:  Diagnosis Date  . Allergic rhinitis, cause unspecified   . Anemia   . Anxiety   . Arthritis   . Asthma   . Chronic kidney disease   . Diabetes mellitus    Type 2  . GERD (gastroesophageal reflux disease)   . Hypertension   . Hypertensive retinopathy   . Hypothyroidism   . Nonspecific elevation of levels of transaminase or lactic acid dehydrogenase (LDH)   . Obesity, unspecified   . Other and unspecified hyperlipidemia   . Psychic factors associated with disease    Psychogenic factors with other diseases  . Unspecified vitamin D deficiency   . Venous engorgement of retina   . Wegener's granulomatosis (HCC)    Past Surgical History:  Procedure Laterality Date  . abdominal ultrasound  05/17/2012   severe hepatomegaly at 22 cm; gallbladder normal.   [facility].  . Calcaneous bone spur surgery     x2  . CT abdomen  06/10/12   Hepatomegaly.  [facility].  [facility] ESOPHAGOGASTRODUODENOSCOPY  08/19/12   diffuse gastritis antrum; pathology negative for malignancy, H. Pylori; esophagus and duodenum normal.  . hida scan  08/27/12   normal; EF 62%.  [facility].  [facility] LUNG BIOPSY  1999  . Meniscus tear     L; x 3 tears.  [facility].  [facility] RENAL BIOPSY  1999  .  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 TONSILLECTOMY     Allergies  Allergen Reactions  . Penicillins Hives  . Sulfonamide Derivatives     Reaction: arthralgias.    Social History   Social History  . Marital status: Married    Spouse name: Renella Cunas  . Number of children: 0  . Years of education: college   Occupational History  . 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 Smoking status: Never Smoker  . Smokeless tobacco: Never Used     Comment: Tobacco use-no  . Alcohol use No  . Drug use: No  . Sexual activity: Yes    Partners: Male   Other Topics Concern  . Not on file   Social History Narrative   Marital status:  Married x 26 years;happily married; no abuse.      Children: none      Lives: with husband, 3 cats, 1 dog.      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      Tobacco: never      Alcohol: never      Drugs: none      Exercise: none      Seatbelt: 100%; no texting while driving.      Guns: none             Family History  Problem Relation Age of Onset  . Hyperlipidemia Mother     Hypercholesterolemia  . Arthritis Mother     OA  . Sarcoidosis Father   . Heart disease Maternal Grandfather   . Emphysema Maternal Grandfather     Cause of death  . Diabetes Maternal Grandfather   . Heart failure Paternal Grandfather     CHF  . Heart disease Paternal Grandfather   . Asthma Brother   . Alcohol abuse Brother   . Emphysema Maternal Grandmother        Objective:    BP 129/88 (BP Location: Right Arm, Patient Position: Sitting, Cuff Size: Large)   Pulse (!) 101   Temp 98.9 F (37.2 C) (Oral)   Resp 16   Ht  (1.651 m)   Wt 277 lb 3.2 oz (125.7 kg)   LMP 06/24/2015 Comment: per patient periods just decided to stop  SpO2 98%   BMI 46.13 kg/m  Physical Exam  Constitutional: She is oriented to person, place, and time. She appears well-developed and well-nourished. No distress.  HENT:  Head: Normocephalic and atraumatic.  Right Ear: External ear normal.  Left Ear: External ear normal.  Nose: Nose normal.  Mouth/Throat: Oropharynx is clear and moist.  Eyes: Conjunctivae and EOM are normal. Pupils are equal, round, and reactive to light.  Neck: Normal range of motion. Neck supple. Carotid bruit is not present. No  thyromegaly present.  Cardiovascular: Normal rate, regular rhythm, normal heart sounds and intact distal pulses.  Exam reveals no gallop and no friction rub.   No murmur heard. Pulmonary/Chest: Effort normal and breath sounds normal. She has no wheezes. She has no rales.  Abdominal: Soft. Bowel sounds are normal. She exhibits no distension and no mass. There is no tenderness. There is no rebound and no guarding.  Lymphadenopathy:    She has no cervical adenopathy.  Neurological: She is alert and oriented to person, place, and time. No cranial nerve deficit.  Skin: Skin is warm  and dry. No rash noted. She is not diaphoretic. No erythema. No pallor.  Psychiatric: She has a normal mood and affect. Her behavior is normal.   Results for orders placed or performed in visit on 02/25/17  POCT glycosylated hemoglobin (Hb A1C)  Result Value Ref Range   Hemoglobin A1C 8.1   POCT glucose (manual entry)  Result Value Ref Range   POC Glucose 228 (A) 70 - 99 mg/dl   Depression screen [facility] 2/9 02/25/2017 11/25/2016 11/25/2016 10/01/2016 08/16/2016  Decreased Interest 0 0 0 0 0  Down, Depressed, Hopeless 0 0 0 0 0  PHQ - 2 Score 0 0 0 0 0   Fall Risk  02/25/2017 11/25/2016 11/25/2016 10/01/2016 08/16/2016  Falls in the past year? No No No No No  Number falls in past yr: - - - - -  Injury with Fall? - - - - -       Assessment & Plan:   1. Diabetic peripheral neuropathy (HCC)   2. Essential hypertension, benign   3. Low iron   4. Vitamin D deficiency   5. Pure hypercholesterolemia    -uncontrolled; start Victoza; continue other diabetes agents.   -highly recommend exercise, weight loss, and low-sugar low-carbohydrate food choices.   Orders Placed This Encounter  Procedures  . CBC with Differential/Platelet  . Comprehensive metabolic panel    Order Specific Question:   Has the patient fasted?    Answer:   Yes  . Lipid panel    Order Specific Question:   Has the patient fasted?    Answer:   Yes  . Iron    . POCT glycosylated hemoglobin (Hb A1C)  . POCT glucose (manual entry)   Meds ordered this encounter  Medications  . liraglutide (VICTOZA) 18 MG/3ML SOPN    Sig: Inject 0.1 mLs (0.6 mg total) into the skin every morning.    Dispense:  3 mL    Refill:  prn    No Follow-up on file.   Safiatou Islam Paulita Fujita, M.D. Primary Care at [facility] [address] [place], [place]  [address] [phone] phone [phone] fax

## 2017-02-26 LAB — LIPID PANEL
CHOL/HDL RATIO: 3.2 ratio (ref 0.0–4.4)
Cholesterol, Total: 157 mg/dL (ref 100–199)
HDL: 49 mg/dL (ref >39)
LDL CALC: 64 mg/dL (ref 0–99)
Triglycerides: 222 mg/dL — ABNORMAL HIGH (ref 0–149)
VLDL Cholesterol Cal: 44 mg/dL — ABNORMAL HIGH (ref 5–40)

## 2017-02-26 LAB — COMPREHENSIVE METABOLIC PANEL
A/G RATIO: 1.4 (ref 1.2–2.2)
ALBUMIN: 4 g/dL (ref 3.5–5.5)
ALT: 13 IU/L (ref 0–32)
AST: 13 IU/L (ref 0–40)
Alkaline Phosphatase: 96 IU/L (ref 39–117)
BUN/Creatinine Ratio: 24 — ABNORMAL HIGH (ref 9–23)
BUN: 23 mg/dL (ref 6–24)
Bilirubin Total: 0.3 mg/dL (ref 0.0–1.2)
CALCIUM: 9.3 mg/dL (ref 8.7–10.2)
CO2: 23 mmol/L (ref 18–29)
Chloride: 100 mmol/L (ref 96–106)
Creatinine, Ser: 0.96 mg/dL (ref 0.57–1.00)
GFR, EST AFRICAN AMERICAN: 79 mL/min/{1.73_m2} (ref >59)
GFR, EST NON AFRICAN AMERICAN: 69 mL/min/{1.73_m2} (ref >59)
Globulin, Total: 2.9 g/dL (ref 1.5–4.5)
Glucose: 218 mg/dL — ABNORMAL HIGH (ref 65–99)
POTASSIUM: 4.8 mmol/L (ref 3.5–5.2)
SODIUM: 139 mmol/L (ref 134–144)
TOTAL PROTEIN: 6.9 g/dL (ref 6.0–8.5)

## 2017-02-26 LAB — CBC WITH DIFFERENTIAL/PLATELET
BASOS: 1 %
Basophils Absolute: 0.1 10*3/uL (ref 0.0–0.2)
EOS (ABSOLUTE): 0.3 10*3/uL (ref 0.0–0.4)
EOS: 3 %
HEMATOCRIT: 38.1 % (ref 34.0–46.6)
Hemoglobin: 11.7 g/dL (ref 11.1–15.9)
IMMATURE GRANULOCYTES: 0 %
Immature Grans (Abs): 0 10*3/uL (ref 0.0–0.1)
Lymphocytes Absolute: 2 10*3/uL (ref 0.7–3.1)
Lymphs: 20 %
MCH: 21.7 pg — ABNORMAL LOW (ref 26.6–33.0)
MCHC: 30.7 g/dL — ABNORMAL LOW (ref 31.5–35.7)
MCV: 71 fL — AB (ref 79–97)
MONOS ABS: 0.7 10*3/uL (ref 0.1–0.9)
Monocytes: 7 %
NEUTROS PCT: 69 %
Neutrophils Absolute: 7 10*3/uL (ref 1.4–7.0)
Platelets: 402 10*3/uL — ABNORMAL HIGH (ref 150–379)
RBC: 5.39 x10E6/uL — ABNORMAL HIGH (ref 3.77–5.28)
RDW: 17.4 % — ABNORMAL HIGH (ref 12.3–15.4)
WBC: 10.1 10*3/uL (ref 3.4–10.8)

## 2017-02-26 LAB — IRON: IRON: 34 ug/dL (ref 27–159)

## 2017-02-26 MED ORDER — CELECOXIB 200 MG PO CAPS: 200.0000 mg | ORAL_CAPSULE | Freq: Two times a day (BID) | ORAL | 0 refills | Status: DC

## 2017-03-19 ENCOUNTER — Encounter: Payer: Self-pay | Admitting: Family Medicine

## 2017-03-21 ENCOUNTER — Other Ambulatory Visit: Payer: Self-pay

## 2017-03-21 DIAGNOSIS — I10 Essential (primary) hypertension: Secondary | ICD-10-CM

## 2017-03-21 MED ORDER — RAMIPRIL 5 MG PO CAPS: 5.0000 mg | ORAL_CAPSULE | Freq: Every day | ORAL | 0 refills | Status: DC

## 2017-03-21 NOTE — Progress Notes (Unsigned)
Pt requested refill on Ramapril Due for follow up July 2018-  Sent #[phone] with note to follow up

## 2017-03-23 ENCOUNTER — Other Ambulatory Visit: Payer: Self-pay | Admitting: Family Medicine

## 2017-03-24 ENCOUNTER — Other Ambulatory Visit: Payer: Self-pay

## 2017-03-24 MED ORDER — EMPAGLIFLOZIN 25 MG PO TABS
25.0000 mg | ORAL_TABLET | Freq: Every day | ORAL | 5 refills | Status: DC
Start: 2017-03-24 — End: 2017-10-19

## 2017-03-25 ENCOUNTER — Other Ambulatory Visit: Payer: Self-pay | Admitting: Family Medicine

## 2017-03-26 NOTE — Telephone Encounter (Signed)
02/26/17 last ov and refill

## 2017-04-02 ENCOUNTER — Other Ambulatory Visit: Payer: Self-pay | Admitting: Family Medicine

## 2017-04-23 ENCOUNTER — Other Ambulatory Visit: Payer: Self-pay | Admitting: Family Medicine

## 2017-04-28 ENCOUNTER — Other Ambulatory Visit: Payer: Self-pay | Admitting: Family Medicine

## 2017-04-28 DIAGNOSIS — I10 Essential (primary) hypertension: Secondary | ICD-10-CM

## 2017-04-29 ENCOUNTER — Other Ambulatory Visit: Payer: Self-pay | Admitting: Family Medicine

## 2017-04-29 MED ORDER — GABAPENTIN 800 MG PO TABS: 800.0000 mg | ORAL_TABLET | Freq: Three times a day (TID) | ORAL | 1 refills | Status: DC

## 2017-04-29 MED ORDER — RAMIPRIL 5 MG PO CAPS: 5.0000 mg | ORAL_CAPSULE | Freq: Every day | ORAL | 3 refills | Status: DC

## 2017-05-19 ENCOUNTER — Ambulatory Visit (INDEPENDENT_AMBULATORY_CARE_PROVIDER_SITE_OTHER): Payer: BC Managed Care – PPO | Admitting: Physician Assistant

## 2017-05-19 ENCOUNTER — Encounter: Payer: Self-pay | Admitting: Physician Assistant

## 2017-05-19 VITALS — BP 119/84 | HR 104 | Temp 98.7°F | Resp 18 | Ht 65.0 in | Wt 282.6 lb

## 2017-05-19 DIAGNOSIS — R42 Dizziness and giddiness: Secondary | ICD-10-CM | POA: Diagnosis not present

## 2017-05-19 DIAGNOSIS — R110 Nausea: Secondary | ICD-10-CM

## 2017-05-19 LAB — POCT URINALYSIS DIP (MANUAL ENTRY)
BILIRUBIN UA: NEGATIVE
BILIRUBIN UA: NEGATIVE mg/dL
LEUKOCYTES UA: NEGATIVE
Nitrite, UA: NEGATIVE
Urobilinogen, UA: 0.2 E.U./dL
pH, UA: 5 (ref 5.0–8.0)

## 2017-05-19 MED ORDER — ONDANSETRON 8 MG PO TBDP: 8.0000 mg | ORAL_TABLET | Freq: Three times a day (TID) | ORAL | 0 refills | Status: DC | PRN

## 2017-05-19 MED ORDER — MECLIZINE HCL 25 MG PO TABS: 12.5000 mg | ORAL_TABLET | Freq: Three times a day (TID) | ORAL | 0 refills | Status: DC | PRN

## 2017-05-19 NOTE — Patient Instructions (Addendum)
We recommend that you schedule a mammogram for breast cancer screening. Typically, you do not need a referral to do this. Please contact a local imaging center to schedule your mammogram.  [facility] - [phone]  *ask for the Radiology Department [facility]) - [phone] or [phone]  [facility] - [phone] [facility] - [phone] [facility] - [phone]  *ask for the Radiology Department [facility] - [phone]  *ask for the Radiology Department [facility] - [phone]  *ask for the Mammography Department [facility] - [phone]     IF you received an x-ray today, you will receive an invoice from [facility]. Please contact [facility] at [phone] with questions or concerns regarding your invoice.   IF you received labwork today, you will receive an invoice from [facility]. Please contact [facility] at [phone] with questions or concerns regarding your invoice.   Our billing staff will not be able to assist you with questions regarding bills from these companies.  You will be contacted with the lab results as soon as they are available. The fastest way to get your results is to activate your My Chart account. Instructions are located on the last page of this paperwork. If you have not heard from us regarding the results in 2 weeks, please contact this office.      

## 2017-05-19 NOTE — Progress Notes (Signed)
Subjective:    Patient ID: Rachel Walls, female    DOB: 1966/05/11, 51 y.o.   MRN: [id number] Ethelda Chick, MD  HPI Presenting for dizziness.  Dizzy spells onset three weeks ago that lasted a few minutes before resolving. She reports it "feels like the room is spinning." Happens most often with bending over, standing up, turning her neck, reaching for something. Avoiding movement alleviates dizziness some.   Two days ago, the spells began lasting longer- 10-30 minutes.  Today, she reports having no energy at all and increased dizziness. Her symptoms are the worst today. She has not fallen.  No recent illness or sick contacts, but she does report allergic-type symptoms a few weeks ago- congestion, sinus pressure. Reports staying well hydrated.   Patient Active Problem List   Diagnosis Date Noted  . Morbid obesity (HCC) 11/25/2016  . Diabetic peripheral neuropathy (HCC) 01/23/2013  . Anxiety and depression 01/23/2013  . Type 2 diabetes mellitus with diabetic polyneuropathy, with long-term current use of insulin (HCC) 09/13/2012  . Essential hypertension, benign 09/13/2012  . Pure hypercholesterolemia 09/13/2012  . Hypothyroidism 09/13/2012  . Hepatomegaly 09/13/2012  . Allergic rhinitis 09/13/2012  . Wegener's granulomatosis (HCC) 09/13/2012  . Vitamin D deficiency 09/13/2012  . Low iron 09/13/2012  . Vitamin B deficiency 09/13/2012   Prior to Admission medications   Medication Sig Start Date End Date Taking? Authorizing Provider  atorvastatin (LIPITOR) 10 MG tablet Take 1 tablet (10 mg total) by mouth daily. 08/16/16  Yes Ethelda Chick, MD  celecoxib (CELEBREX) 200 MG capsule Take 1 capsule (200 mg total) by mouth 2 (two) times daily. 04/23/17  Yes Ethelda Chick, MD  DULoxetine (CYMBALTA) 60 MG capsule Take 1 capsule (60 mg total) by mouth 2 (two) times daily. 11/03/16  Yes Ethelda Chick, MD  empagliflozin (JARDIANCE) 25 MG TABS tablet Take 25 mg by mouth daily.  03/24/17  Yes Ethelda Chick, MD  fluticasone [facility]) 50 MCG/ACT nasal spray Place 2 sprays into both nostrils daily. 09/16/15  Yes Ethelda Chick, MD  gabapentin (NEURONTIN) 800 MG tablet Take 1 tablet (800 mg total) by mouth 3 (three) times daily. 04/29/17  Yes Ethelda Chick, MD  GLIPIZIDE XL 10 MG 24 hr tablet Take 1 tablet (10 mg total) by mouth daily with breakfast. 04/03/17  Yes Ethelda Chick, MD  glucose blood test strip Use as instructed--check sugar tid 10/02/16  Yes Ethelda Chick, MD  Insulin Glargine [facility] KWIKPEN) 100 UNIT/ML SOPN Inject 46 Units into the skin daily at 10 pm. 10/06/16  Yes Ethelda Chick, MD  Insulin Pen Needle (PEN NEEDLES) 32G X 4 MM MISC Inject 1 each into the skin daily. (DX: E11.9) 05/22/16  Yes Ethelda Chick, MD  lansoprazole (PREVACID) 30 MG capsule Take 1 capsule (30 mg total) by mouth daily. 11/13/16  Yes Ethelda Chick, MD  levothyroxine (SYNTHROID, LEVOTHROID) 100 MCG tablet Take 1 tablet (100 mcg total) by mouth daily. 09/12/16  Yes Ethelda Chick, MD  liraglutide (VICTOZA) 18 MG/3ML SOPN Inject 0.1 mLs (0.6 mg total) into the skin every morning. 02/25/17  Yes Ethelda Chick, MD  metFORMIN (GLUCOPHAGE) 500 MG tablet Take 1 tablet (500 mg total) by mouth every evening. With food. 01/10/17  Yes Ethelda Chick, MD  mycophenolate (CELLCEPT) 250 MG capsule Take 250 mg by mouth at bedtime. 2 tab in the morning & 1 tab at night.   Yes [provider]  ramipril (ALTACE) 5 MG capsule Take 1 capsule (5 mg total) by mouth daily. 04/29/17  Yes Ethelda ChickSmith, Kristi M, MD  Vitamin D, Ergocalciferol, (DRISDOL) 50000 units CAPS capsule Take 1 capsule (50,000 Units total) by mouth every 7 (seven) days. 04/29/17  Yes Ethelda ChickSmith, Kristi M, MD  albuterol (PROVENTIL HFA;VENTOLIN HFA) 108 (90 BASE) MCG/ACT inhaler Inhale 90 mcg into the lungs every 4 (four) hours as needed. 12/23/14 12/23/15  [provider]   Allergies  Allergen Reactions  . Penicillins Hives  .  Sulfonamide Derivatives     Reaction: arthralgias.      Review of Systems  Constitutional: Positive for fatigue. Negative for fever.  HENT: Positive for congestion and sinus pressure. Negative for rhinorrhea, sore throat and tinnitus.   Cardiovascular: Positive for palpitations. Negative for chest pain.  Gastrointestinal: Positive for nausea. Negative for abdominal pain and vomiting.  Endocrine: Positive for cold intolerance and heat intolerance.  Genitourinary: Negative for dysuria and frequency.  Musculoskeletal: Negative for neck pain and neck stiffness.  Skin: Negative for color change.  Neurological: Positive for dizziness. Negative for seizures, syncope, weakness and headaches.  Psychiatric/Behavioral: Negative for confusion and sleep disturbance.  Feels off-balance while walking due to dizziness.     Objective:   Physical Exam  Constitutional: She is oriented to person, place, and time. She appears well-developed and well-nourished.  HENT:  Head: Normocephalic and atraumatic.  Right Ear: External ear normal.  Left Ear: External ear normal.  Nose: Nose normal.  Eyes: Conjunctivae and EOM are normal. Pupils are equal, round, and reactive to light.  Neck: Normal range of motion. Neck supple. Carotid bruit is not present. No thyromegaly present.  Mild tenderness on anterior cervical neck.  Cardiovascular: Normal rate, regular rhythm, normal heart sounds and intact distal pulses.   Pulmonary/Chest: Effort normal and breath sounds normal.  Lymphadenopathy:    She has no cervical adenopathy.  Neurological: She is alert and oriented to person, place, and time. She has normal strength. No cranial nerve deficit. Gait normal.  Reflex Scores:      Brachioradialis reflexes are 2+ on the right side and 2+ on the left side.      Patellar reflexes are 2+ on the right side and 2+ on the left side. Skin: Skin is warm, dry and intact.       Assessment & Plan:   1.  Dizziness Discussed sinusitis as a potential cause of dizziness, and her h/o Wegener's granulomatosis makes her more likely to develop these infections.  Labs pending. Plan non-contrast head CT if labs are WNL and symptoms persist. - meclizine (ANTIVERT) 25 MG tablet; Take 0.5-1 tablets (12.5-25 mg total) by mouth 3 (three) times daily as needed for dizziness.  Dispense: 30 tablet; Refill: 0 - CBC with Differential/Platelet - Comprehensive metabolic panel - TSH - POCT urinalysis dipstick  2. Nausea without vomiting Encouraged increased hydration and fiber intake to avoid constipation with Zofran. - ondansetron (ZOFRAN-ODT) 8 MG disintegrating tablet; Take 1 tablet (8 mg total) by mouth every 8 (eight) hours as needed for nausea.  Dispense: 20 tablet; Refill: 0

## 2017-05-19 NOTE — Progress Notes (Signed)
Patient ID: Rachel Walls, female    DOB: 09/16/1966, 51 y.o.   MRN: [id number]  PCP: Ethelda ChickSmith, Kristi M, MD  Chief Complaint  Patient presents with  . Dizziness    x3 weeks, per pt has been worse since Sunday and not going away as quick. Per PT says sometimes her heart will race. No blurred vision, no ringing in the ears, no headaches    Subjective:   Presents for evaluation of dizziness x 3 weeks.  Initially she thought this was due to allergies, but as those symptoms have improved and resolved, the dizziness has not. For the past 3 days the dizziness has been more severe and doesn't resolve as quickly.  Typically happens with rapid position change. Initially resolved after just a few minutes. Now lasting 10-30 minutes. Reduced energy. Reducing her movement helps. Has been working to stay well hydrated. Occasionally she has associated fast heart beat. Mild nausea. No associated headache, ear pain, fullness or ringing, visual changes. No CP, SOB, nausea, near syncope. No weakness, confusion, paresthesias. Feels like the room is spinning.  She has a history of recurrent sinusitis, but has never had dizziness associated with sinusitis before. 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 She also has diabetes and hypothyroidism. Labs were normal at her visit 2 months ago.    Review of Systems Constitutional: Positive for fatigue. Negative for fever.  HENT: Positive for congestion and sinus pressure. Negative for rhinorrhea, sore throat and tinnitus.   Cardiovascular: Positive for palpitations. Negative for chest pain.  Gastrointestinal: Positive for nausea. Negative for abdominal pain and vomiting.  Endocrine: Positive for cold intolerance and heat intolerance.  Genitourinary: Negative for dysuria and frequency.  Musculoskeletal: Negative for neck pain and neck stiffness.  Skin: Negative for color change.  Neurological: Positive for dizziness. Negative  for seizures, syncope, weakness and headaches.  Psychiatric/Behavioral: Negative for confusion and sleep disturbance.  Feels off-balance while walking due to dizziness.    Patient Active Problem List   Diagnosis Date Noted  . Morbid obesity (HCC) 11/25/2016  . Diabetic peripheral neuropathy (HCC) 01/23/2013  . Anxiety and depression 01/23/2013  . Type 2 diabetes mellitus with diabetic polyneuropathy, with long-term current use of insulin (HCC) 09/13/2012  . Essential hypertension, benign 09/13/2012  . Pure hypercholesterolemia 09/13/2012  . Hypothyroidism 09/13/2012  . Hepatomegaly 09/13/2012  . Allergic rhinitis 09/13/2012  . Wegener's granulomatosis (HCC) 09/13/2012  . Vitamin D deficiency 09/13/2012  . Low iron 09/13/2012  . Vitamin B deficiency 09/13/2012     Prior to Admission medications   Medication Sig Start Date End Date Taking? Authorizing Provider  atorvastatin (LIPITOR) 10 MG tablet Take 1 tablet (10 mg total) by mouth daily. 08/16/16  Yes Ethelda ChickSmith, Kristi M, MD  celecoxib (CELEBREX) 200 MG capsule Take 1 capsule (200 mg total) by mouth 2 (two) times daily. 04/23/17  Yes Ethelda ChickSmith, Kristi M, MD  DULoxetine (CYMBALTA) 60 MG capsule Take 1 capsule (60 mg total) by mouth 2 (two) times daily. 11/03/16  Yes Ethelda ChickSmith, Kristi M, MD  empagliflozin (JARDIANCE) 25 MG TABS tablet Take 25 mg by mouth daily. 03/24/17  Yes Ethelda ChickSmith, Kristi M, MD  fluticasone [facility](FLONASE) 50 MCG/ACT nasal spray Place 2 sprays into both nostrils daily. 09/16/15  Yes Ethelda ChickSmith, Kristi M, MD  gabapentin (NEURONTIN) 800 MG tablet Take 1 tablet (800 mg total) by mouth 3 (three) times daily. 04/29/17  Yes Ethelda ChickSmith, Kristi M, MD  GLIPIZIDE XL 10 MG 24 hr tablet Take 1 tablet (  10 mg total) by mouth daily with breakfast. 04/03/17  Yes Ethelda Chick, MD  glucose blood test strip Use as instructed--check sugar tid 10/02/16  Yes Ethelda Chick, MD  Insulin Glargine [facility]) 100 UNIT/ML SOPN Inject 46 Units into the skin daily at 10  pm. 10/06/16  Yes Ethelda Chick, MD  Insulin Pen Needle (PEN NEEDLES) 32G X 4 MM MISC Inject 1 each into the skin daily. (DX: E11.9) 05/22/16  Yes Ethelda Chick, MD  lansoprazole (PREVACID) 30 MG capsule Take 1 capsule (30 mg total) by mouth daily. 11/13/16  Yes Ethelda Chick, MD  levothyroxine (SYNTHROID, LEVOTHROID) 100 MCG tablet Take 1 tablet (100 mcg total) by mouth daily. 09/12/16  Yes Ethelda Chick, MD  liraglutide (VICTOZA) 18 MG/3ML SOPN Inject 0.1 mLs (0.6 mg total) into the skin every morning. 02/25/17  Yes Ethelda Chick, MD  metFORMIN (GLUCOPHAGE) 500 MG tablet Take 1 tablet (500 mg total) by mouth every evening. With food. 01/10/17  Yes Ethelda Chick, MD  mycophenolate (CELLCEPT) 250 MG capsule Take 250 mg by mouth at bedtime. 2 tab in the morning & 1 tab at night.   Yes [provider]  ramipril (ALTACE) 5 MG capsule Take 1 capsule (5 mg total) by mouth daily. 04/29/17  Yes Ethelda Chick, MD  Vitamin D, Ergocalciferol, (DRISDOL) 50000 units CAPS capsule Take 1 capsule (50,000 Units total) by mouth every 7 (seven) days. 04/29/17  Yes Ethelda Chick, MD  albuterol (PROVENTIL HFA;VENTOLIN HFA) 108 (90 BASE) MCG/ACT inhaler Inhale 90 mcg into the lungs every 4 (four) hours as needed. 12/23/14 12/23/15  [provider]     Allergies  Allergen Reactions  . Penicillins Hives  . Sulfonamide Derivatives     Reaction: arthralgias.       Objective:  Physical Exam  Constitutional: She is oriented to person, place, and time. She appears well-developed and well-nourished. She is active and cooperative. No distress.  BP 119/84 (BP Location: Right Arm, Patient Position: Sitting, Cuff Size: Large)   Pulse (!) 104   Temp 98.7 F (37.1 C) (Oral)   Resp 18   Ht 5\' 5"  (1.651 m)   Wt 282 lb 9.6 oz (128.2 kg)   LMP 06/24/2015 Comment: per patient periods just decided to stop  SpO2 95%   BMI 47.03 kg/m   HENT:  Head: Normocephalic and atraumatic.  Right Ear: Hearing,  tympanic membrane, external ear and ear canal normal.  Left Ear: Hearing, tympanic membrane, external ear and ear canal normal.  Nose: Nose normal.  Mouth/Throat: Uvula is midline, oropharynx is clear and moist and mucous membranes are normal.  Eyes: Conjunctivae and lids are normal. Pupils are equal, round, and reactive to light. No scleral icterus.  Neck: Normal range of motion, full passive range of motion without pain and phonation normal. Neck supple. No thyromegaly present.    Cardiovascular: Normal rate, regular rhythm and normal heart sounds.   Pulses:      Radial pulses are 2+ on the right side, and 2+ on the left side.  Pulmonary/Chest: Effort normal and breath sounds normal.  Lymphadenopathy:       Head (right side): No tonsillar, no preauricular, no posterior auricular and no occipital adenopathy present.       Head (left side): No tonsillar, no preauricular, no posterior auricular and no occipital adenopathy present.    She has no cervical adenopathy.       Right: No supraclavicular  adenopathy present.       Left: No supraclavicular adenopathy present.  Neurological: She is alert and oriented to person, place, and time. She has normal strength. No cranial nerve deficit or sensory deficit.  Reflex Scores:      Brachioradialis reflexes are 2+ on the right side and 2+ on the left side.      Patellar reflexes are 2+ on the right side and 2+ on the left side.      Achilles reflexes are 2+ on the right side and 2+ on the left side. No nystagmus.  Skin: Skin is warm, dry and intact. No rash noted. No cyanosis or erythema. Nails show no clubbing.  Psychiatric: She has a normal mood and affect. Her speech is normal and behavior is normal.        Assessment & Plan:   1. Dizziness Unclear etiology. Await lab results. If normal, would consider sinusitis and CT scan. Trial of meclizine. - meclizine (ANTIVERT) 25 MG tablet; Take 0.5-1 tablets (12.5-25 mg total) by mouth 3 (three)  times daily as needed for dizziness.  Dispense: 30 tablet; Refill: 0 - CBC with Differential/Platelet - Comprehensive metabolic panel - TSH - POCT urinalysis dipstick  2. Nausea without vomiting Due to #1. Supportive care. - ondansetron (ZOFRAN-ODT) 8 MG disintegrating tablet; Take 1 tablet (8 mg total) by mouth every 8 (eight) hours as needed for nausea.  Dispense: 20 tablet; Refill: 0    Return if symptoms worsen or fail to improve.   Fernande Bras, PA-C Primary Care at [facility]

## 2017-05-21 LAB — CBC WITH DIFFERENTIAL/PLATELET
BASOS ABS: 0 10*3/uL (ref 0.0–0.2)
Basos: 0 %
EOS (ABSOLUTE): 0.3 10*3/uL (ref 0.0–0.4)
EOS: 3 %
Hematocrit: 40.4 % (ref 34.0–46.6)
Hemoglobin: 11.9 g/dL (ref 11.1–15.9)
IMMATURE GRANS (ABS): 0 10*3/uL (ref 0.0–0.1)
IMMATURE GRANULOCYTES: 0 %
LYMPHS ABS: 2.4 10*3/uL (ref 0.7–3.1)
LYMPHS: 23 %
MCH: 21.6 pg — ABNORMAL LOW (ref 26.6–33.0)
MCHC: 29.5 g/dL — AB (ref 31.5–35.7)
MCV: 73 fL — ABNORMAL LOW (ref 79–97)
MONOCYTES: 8 %
Monocytes Absolute: 0.9 10*3/uL (ref 0.1–0.9)
NEUTROS PCT: 66 %
Neutrophils Absolute: 7 10*3/uL (ref 1.4–7.0)
PLATELETS: 439 10*3/uL — AB (ref 150–379)
RBC: 5.52 x10E6/uL — AB (ref 3.77–5.28)
RDW: 17.5 % — AB (ref 12.3–15.4)
WBC: 10.7 10*3/uL (ref 3.4–10.8)

## 2017-05-21 LAB — TSH: TSH: 2.7 u[IU]/mL (ref 0.450–4.500)

## 2017-05-21 LAB — COMPREHENSIVE METABOLIC PANEL
ALK PHOS: 98 IU/L (ref 39–117)
ALT: 19 IU/L (ref 0–32)
AST: 15 IU/L (ref 0–40)
Albumin/Globulin Ratio: 1.8 (ref 1.2–2.2)
Albumin: 4.3 g/dL (ref 3.5–5.5)
BUN/Creatinine Ratio: 21 (ref 9–23)
BUN: 20 mg/dL (ref 6–24)
Bilirubin Total: <0.2 mg/dL (ref 0.0–1.2)
CO2: 25 mmol/L (ref 20–29)
CREATININE: 0.96 mg/dL (ref 0.57–1.00)
Calcium: 9.4 mg/dL (ref 8.7–10.2)
Chloride: 100 mmol/L (ref 96–106)
GFR calc Af Amer: 79 mL/min/{1.73_m2} (ref >59)
GFR, EST NON AFRICAN AMERICAN: 69 mL/min/{1.73_m2} (ref >59)
GLOBULIN, TOTAL: 2.4 g/dL (ref 1.5–4.5)
GLUCOSE: 173 mg/dL — AB (ref 65–99)
Potassium: 4.8 mmol/L (ref 3.5–5.2)
SODIUM: 141 mmol/L (ref 134–144)
Total Protein: 6.7 g/dL (ref 6.0–8.5)

## 2017-06-01 ENCOUNTER — Encounter: Payer: Self-pay | Admitting: Physician Assistant

## 2017-06-09 ENCOUNTER — Telehealth: Payer: Self-pay | Admitting: Family Medicine

## 2017-06-09 ENCOUNTER — Encounter: Payer: Self-pay | Admitting: Family Medicine

## 2017-06-09 ENCOUNTER — Ambulatory Visit (INDEPENDENT_AMBULATORY_CARE_PROVIDER_SITE_OTHER): Payer: BC Managed Care – PPO | Admitting: Family Medicine

## 2017-06-09 VITALS — BP 110/80 | HR 93 | Temp 98.0°F | Resp 18 | Ht 65.75 in | Wt 282.0 lb

## 2017-06-09 DIAGNOSIS — M5136 Other intervertebral disc degeneration, lumbar region: Secondary | ICD-10-CM

## 2017-06-09 DIAGNOSIS — R102 Pelvic and perineal pain: Secondary | ICD-10-CM | POA: Diagnosis not present

## 2017-06-09 DIAGNOSIS — Z794 Long term (current) use of insulin: Secondary | ICD-10-CM

## 2017-06-09 DIAGNOSIS — E1142 Type 2 diabetes mellitus with diabetic polyneuropathy: Secondary | ICD-10-CM

## 2017-06-09 DIAGNOSIS — G4710 Hypersomnia, unspecified: Secondary | ICD-10-CM

## 2017-06-09 LAB — POCT GLYCOSYLATED HEMOGLOBIN (HGB A1C): HEMOGLOBIN A1C: 7

## 2017-06-09 LAB — GLUCOSE, POCT (MANUAL RESULT ENTRY): POC Glucose: 178 mg/dl — AB (ref 70–99)

## 2017-06-09 MED ORDER — KETOCONAZOLE 2 % EX CREA: 1.0000 "application " | TOPICAL_CREAM | Freq: Two times a day (BID) | CUTANEOUS | 0 refills | Status: DC | PRN

## 2017-06-09 NOTE — Telephone Encounter (Signed)
Noted. Lab results will get mailed when they result and provider looks at them

## 2017-06-09 NOTE — Patient Instructions (Addendum)
  INCREASE METFORMIN TO ONE TABLET WITH LUNCH AND SUPPER.   INCREASE VICTOZA TO 1.2 DAILY FOR TWO WEEKS AND THEN INCREASE TO 1.8 DAILY. STOP/HOLD GLIPIZIDE FOR NOW.  60 grams of protein per day (20 grams per meal). Limit 1500 calories day.  IF you received an x-ray today, you will receive an invoice from [facility]. Please contact [facility] at [phone] with questions or concerns regarding your invoice.   IF you received labwork today, you will receive an invoice from [facility]. Please contact [facility] at [phone] with questions or concerns regarding your invoice.   Our billing staff will not be able to assist you with questions regarding bills from these companies.  You will be contacted with the lab results as soon as they are available. The fastest way to get your results is to activate your My Chart account. Instructions are located on the last page of this paperwork. If you have not heard from us regarding the results in 2 weeks, please contact this office.

## 2017-06-09 NOTE — Progress Notes (Signed)
Subjective:    Patient ID: Rachel Walls, female    DOB: 07/10/1966, 51 y.o.   MRN: [id number]  06/09/2017  Diabetes ([place] [place] follow-up) and Hypertension   HPI This 51 y.o. female presents for evaluation of DMII, hypertension, hypercholesterolemia.   DMII.  Very frustrated today due to lack of weight loss and lack of improvement in sugars.  Taking several diabetes medications and not making progress.  Not losing weight.  Trying to be diligent with diet. Qhs: Metformin 500mg  qhs. Basaglar 56units qhs.  Am: Victoza 0.6 Jardiance 25mg  Glipizide XL 10mg   Lower back pain: questions on Celebrex; wants to know mechanism of action.  Lower back pain is fine during the day.  At night, when goes to sleep, then lower back pain is horrible . Will not calm down.  Celebrex is not controlling pain.  Getting up in the morning is horrible.  Intermittent radiation.   Considering Dimonsky.  Not taking Gabapentin at night one.  BP Readings from Last 3 Encounters:  06/09/17 110/80  05/19/17 119/84  02/25/17 129/88   Wt Readings from Last 3 Encounters:  06/09/17 282 lb (127.9 kg)  05/19/17 282 lb 9.6 oz (128.2 kg)  02/25/17 277 lb 3.2 oz (125.7 kg)   Immunization History  Administered Date(s) Administered  . Hepatitis A, Adult 09/28/2013, 05/29/2014  . Hepatitis B 03/16/2013  . Hepatitis B, adult 06/27/2013, 09/28/2013  . Influenza Split 08/06/2010  . Influenza,inj,Quad PF,36+ Mos 09/28/2013, 08/28/2014, 08/10/2015, 08/16/2016  . Pneumococcal Conjugate-13 11/01/2014  . Pneumococcal Polysaccharide-23 11/24/1998, 09/28/2013  . Tdap 01/18/2011    Review of Systems  Constitutional: Negative for chills, diaphoresis, fatigue and fever.  Eyes: Negative for visual disturbance.  Respiratory: Negative for cough and shortness of breath.   Cardiovascular: Negative for chest pain, palpitations and leg swelling.  Gastrointestinal: Negative for abdominal pain, constipation, diarrhea, nausea and  vomiting.  Endocrine: Negative for cold intolerance, heat intolerance, polydipsia, polyphagia and polyuria.  Musculoskeletal: Positive for back pain.  Neurological: Negative for dizziness, tremors, seizures, syncope, facial asymmetry, speech difficulty, weakness, light-headedness, numbness and headaches.  Psychiatric/Behavioral: Negative for dysphoric mood. The patient is nervous/anxious.     Past Medical History:  Diagnosis Date  . Allergic rhinitis, cause unspecified   . Anemia   . Anxiety   . Arthritis   . Asthma   . Chronic kidney disease   . Diabetes mellitus    Type 2  . GERD (gastroesophageal reflux disease)   . Hypertension   . Hypertensive retinopathy   . Hypothyroidism   . Nonspecific elevation of levels of transaminase or lactic acid dehydrogenase (LDH)   . Obesity, unspecified   . Other and unspecified hyperlipidemia   . Psychic factors associated with disease    Psychogenic factors with other diseases  . Unspecified vitamin D deficiency   . Venous engorgement of retina   . Wegener's granulomatosis (HCC)    Past Surgical History:  Procedure Laterality Date  . abdominal ultrasound  05/17/2012   severe hepatomegaly at 22 cm; gallbladder normal.   [facility].  . Calcaneous bone spur surgery     x2  . CT abdomen  06/10/12   Hepatomegaly.  [facility].  Marland Kitchen. ESOPHAGOGASTRODUODENOSCOPY  08/19/12   diffuse gastritis antrum; pathology negative for malignancy, H. Pylori; esophagus and duodenum normal.  . hida scan  08/27/12   normal; EF 62%.  [facility].  Marland Kitchen. LUNG BIOPSY  1999  . Meniscus tear     L; x 3 tears.  [facility].  Marland Kitchen RENAL BIOPSY  1999  . 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 TONSILLECTOMY     Allergies  Allergen Reactions  . Penicillins Hives  . Sulfonamide Derivatives     Reaction: arthralgias.    Social History   Social History  . Marital status: Married    Spouse name: Renella Cunas  . Number of children: 0  . Years of education:  college   Occupational History  . 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 Smoking status: Never Smoker  . Smokeless tobacco: Never Used     Comment: Tobacco use-no  . Alcohol use No  . Drug use: No  . Sexual activity: Yes    Partners: Male   Other Topics Concern  . Not on file   Social History Narrative   Marital status:  Married x 26 years;happily married; no abuse.      Children: none      Lives: with husband, 3 cats, 1 dog.      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      Tobacco: never      Alcohol: never      Drugs: none      Exercise: none      Seatbelt: 100%; no texting while driving.      Guns: none             Family History  Problem Relation Age of Onset  . Hyperlipidemia Mother        Hypercholesterolemia  . Arthritis Mother        OA  . Sarcoidosis Father   . Heart disease Maternal Grandfather   . Emphysema Maternal Grandfather        Cause of death  . Diabetes Maternal Grandfather   . Heart failure Paternal Grandfather        CHF  . Heart disease Paternal Grandfather   . Asthma Brother   . Alcohol abuse Brother   . Emphysema Maternal Grandmother        Objective:    BP 110/80   Pulse 93   Temp 98 F (36.7 C) (Oral)   Resp 18   Ht 5' 5.75" (1.67 m)   Wt 282 lb (127.9 kg)   LMP 06/24/2015 Comment: per patient periods just decided to stop  SpO2 95%   BMI 45.87 kg/m  Physical Exam  Constitutional: She is oriented to person, place, and time. She appears well-developed and well-nourished. No distress.  HENT:  Head: Normocephalic and atraumatic.  Right Ear: External ear normal.  Left Ear: External ear normal.  Nose: Nose normal.  Mouth/Throat: Oropharynx is clear and moist.  Eyes: Pupils are equal, round, and reactive to light. Conjunctivae and EOM are normal.  Neck: Normal range of motion. Neck supple. Carotid bruit is not present. No thyromegaly present.    Cardiovascular: Normal rate, regular rhythm, normal heart sounds and intact distal pulses.  Exam reveals no gallop and no friction rub.   No murmur heard. Pulmonary/Chest: Effort normal and breath sounds normal. She has no wheezes. She has no rales.  Abdominal: Soft. Bowel sounds are normal. She exhibits no distension and no mass. There is no tenderness. There is no rebound and no guarding.  Musculoskeletal:       Lumbar back: She exhibits decreased range of motion, pain and spasm. She exhibits no  tenderness and no bony tenderness.  Lymphadenopathy:    She has no cervical adenopathy.  Neurological: She is alert and oriented to person, place, and time. No cranial nerve deficit.  Skin: Skin is warm and dry. Rash noted. Rash is maculopapular. She is not diaphoretic. No erythema. No pallor.  Psychiatric: She has a normal mood and affect. Her behavior is normal.   Results for orders placed or performed in visit on 06/09/17  CBC with Differential/Platelet  Result Value Ref Range   WBC 8.9 3.4 - 10.8 x10E3/uL   RBC 5.55 (H) 3.77 - 5.28 x10E6/uL   Hemoglobin 11.4 11.1 - 15.9 g/dL   Hematocrit 69.6 29.5 - 46.6 %   MCV 70 (L) 79 - 97 fL   MCH 20.5 (L) 26.6 - 33.0 pg   MCHC 29.5 (L) 31.5 - 35.7 g/dL   RDW 28.4 (H) 13.2 - 44.0 %   Platelets 413 (H) 150 - 379 x10E3/uL   Neutrophils 72 Not Estab. %   Lymphs 17 Not Estab. %   Monocytes 7 Not Estab. %   Eos 4 Not Estab. %   Basos 0 Not Estab. %   Neutrophils Absolute 6.4 1.4 - 7.0 x10E3/uL   Lymphocytes Absolute 1.5 0.7 - 3.1 x10E3/uL   Monocytes Absolute 0.6 0.1 - 0.9 x10E3/uL   EOS (ABSOLUTE) 0.3 0.0 - 0.4 x10E3/uL   Basophils Absolute 0.0 0.0 - 0.2 x10E3/uL   Immature Granulocytes 0 Not Estab. %   Immature Grans (Abs) 0.0 0.0 - 0.1 x10E3/uL  Comprehensive metabolic panel  Result Value Ref Range   Glucose 159 (H) 65 - 99 mg/dL   BUN 21 6 - 24 mg/dL   Creatinine, Ser 1.02 0.57 - 1.00 mg/dL   GFR calc non Af Amer 68 >59 mL/min/1.73   GFR  calc Af Amer 78 >59 mL/min/1.73   BUN/Creatinine Ratio 22 9 - 23   Sodium 141 134 - 144 mmol/L   Potassium 5.0 3.5 - 5.2 mmol/L   Chloride 102 96 - 106 mmol/L   CO2 22 20 - 29 mmol/L   Calcium 9.3 8.7 - 10.2 mg/dL   Total Protein 6.8 6.0 - 8.5 g/dL   Albumin 4.2 3.5 - 5.5 g/dL   Globulin, Total 2.6 1.5 - 4.5 g/dL   Albumin/Globulin Ratio 1.6 1.2 - 2.2   Bilirubin Total 0.2 0.0 - 1.2 mg/dL   Alkaline Phosphatase 81 39 - 117 IU/L   AST 18 0 - 40 IU/L   ALT 12 0 - 32 IU/L  Lipid panel  Result Value Ref Range   Cholesterol, Total 150 100 - 199 mg/dL   Triglycerides 725 (H) 0 - 149 mg/dL   HDL 53 >36 mg/dL   VLDL Cholesterol Cal 34 5 - 40 mg/dL   LDL Calculated 63 0 - 99 mg/dL   Chol/HDL Ratio 2.8 0.0 - 4.4 ratio  POCT glucose (manual entry)  Result Value Ref Range   POC Glucose 178 (A) 70 - 99 mg/dl  POCT glycosylated hemoglobin (Hb A1C)  Result Value Ref Range   Hemoglobin A1C 7.0    Depression screen [facility] 2/9 06/09/2017 05/19/2017 02/25/2017 11/25/2016 11/25/2016  Decreased Interest 0 0 0 0 0  Down, Depressed, Hopeless 0 0 0 0 0  PHQ - 2 Score 0 0 0 0 0       Assessment & Plan:   1. Type 2 diabetes mellitus with diabetic polyneuropathy, with long-term current use of insulin (HCC)   2. Degenerative disc disease, lumbar  3. Hypersomnolence   4. Pelvic pain    -DMII improving finally; pt frustrated by lack of control of DMII: increase Metformin to one tablet with lunch and supper; increase Victoza t 1.2 daily for two weeks and then to 1.8 daily; stop/hold Glipizide to avoid weight gain.  I recommend weight loss, exercise, and low-carbohydrate low-sugar food choices. You should AVOID: regular sodas, sweetened tea, fruit juices.  You should LIMIT: breads, pastas, rice, potatoes, and desserts/sweets.  I would recommend limiting your total carbohydrate intake per meal to 45 grams; I would limit your total carbohydrate intake per snack to 30 grams.  I would also have a goal of 60 grams of  protein intake per day; this would equal 10-15 grams of protein per meal and 5-10 grams of protein per snack. -recommend weight loss, exercise for 30-60 minutes five days per week; recommend 1200 kcal restriction per day with a minimum of 60 grams of protein per day. -refer to ortho for worsening DDD lumbar spine/lower back pain. -refer for pelvic US due to ongoing pelvic pain LEFT. -suffering with fatigue and hypersomnolence; refer for sleep study.   Orders Placed This Encounter  Procedures  . US Pelvis Complete    Standing Status:   Future    Number of Occurrences:   1    Standing Expiration Date:   08/11/2018    Order Specific Question:   Reason for Exam (SYMPTOM  OR DIAGNOSIS REQUIRED)    Answer:   L pelvic pain    Order Specific Question:   Preferred imaging location?    Answer:   [facility]  . US Transvaginal Non-OB    Standing Status:   Future    Number of Occurrences:   1    Standing Expiration Date:   08/11/2018    Order Specific Question:   Reason for Exam (SYMPTOM  OR DIAGNOSIS REQUIRED)    Answer:   L pelvic pain    Order Specific Question:   Preferred imaging location?    Answer:   [facility]  . CBC with Differential/Platelet  . Comprehensive metabolic panel    Order Specific Question:   Has the patient fasted?    Answer:   Yes  . Lipid panel    Order Specific Question:   Has the patient fasted?    Answer:   Yes  . Ambulatory referral to Orthopedic Surgery    Referral Priority:   Routine    Referral Type:   Surgical    Referral Reason:   Specialty Services Required    Requested Specialty:   Orthopedic Surgery    Number of Visits Requested:   1  . Ambulatory referral to Sleep Studies    Referral Priority:   Routine    Referral Type:   Consultation    Referral Reason:   Specialty Services Required    Number of Visits Requested:   1  . POCT glucose (manual entry)  . POCT glycosylated hemoglobin (Hb A1C)   Meds ordered this encounter    Medications  . ketoconazole (NIZORAL) 2 % cream    Sig: Apply 1 application topically 2 (two) times daily as needed for irritation.    Dispense:  30 g    Refill:  0    Return in about 4 weeks (around 07/07/2017) for recheck sugars, weight, rash.   Adie Vilar Paulita Fujita, M.D. Primary Care at [facility] [address] [place], [place]  [address] [phone]  phone [phone] fax

## 2017-06-09 NOTE — Telephone Encounter (Signed)
Pt's MyChart is currently not working.  Pt is requesting her lab reports to be mailed to her when results come back.

## 2017-06-10 LAB — CBC WITH DIFFERENTIAL/PLATELET
BASOS: 0 %
Basophils Absolute: 0 10*3/uL (ref 0.0–0.2)
EOS (ABSOLUTE): 0.3 10*3/uL (ref 0.0–0.4)
EOS: 4 %
HEMATOCRIT: 38.7 % (ref 34.0–46.6)
Hemoglobin: 11.4 g/dL (ref 11.1–15.9)
Immature Grans (Abs): 0 10*3/uL (ref 0.0–0.1)
Immature Granulocytes: 0 %
LYMPHS ABS: 1.5 10*3/uL (ref 0.7–3.1)
Lymphs: 17 %
MCH: 20.5 pg — AB (ref 26.6–33.0)
MCHC: 29.5 g/dL — AB (ref 31.5–35.7)
MCV: 70 fL — AB (ref 79–97)
MONOS ABS: 0.6 10*3/uL (ref 0.1–0.9)
Monocytes: 7 %
NEUTROS ABS: 6.4 10*3/uL (ref 1.4–7.0)
Neutrophils: 72 %
PLATELETS: 413 10*3/uL — AB (ref 150–379)
RBC: 5.55 x10E6/uL — ABNORMAL HIGH (ref 3.77–5.28)
RDW: 17.7 % — AB (ref 12.3–15.4)
WBC: 8.9 10*3/uL (ref 3.4–10.8)

## 2017-06-10 LAB — LIPID PANEL
CHOL/HDL RATIO: 2.8 ratio (ref 0.0–4.4)
Cholesterol, Total: 150 mg/dL (ref 100–199)
HDL: 53 mg/dL (ref >39)
LDL Calculated: 63 mg/dL (ref 0–99)
TRIGLYCERIDES: 168 mg/dL — AB (ref 0–149)
VLDL Cholesterol Cal: 34 mg/dL (ref 5–40)

## 2017-06-10 LAB — COMPREHENSIVE METABOLIC PANEL
A/G RATIO: 1.6 (ref 1.2–2.2)
ALK PHOS: 81 IU/L (ref 39–117)
ALT: 12 IU/L (ref 0–32)
AST: 18 IU/L (ref 0–40)
Albumin: 4.2 g/dL (ref 3.5–5.5)
BILIRUBIN TOTAL: 0.2 mg/dL (ref 0.0–1.2)
BUN/Creatinine Ratio: 22 (ref 9–23)
BUN: 21 mg/dL (ref 6–24)
CALCIUM: 9.3 mg/dL (ref 8.7–10.2)
CHLORIDE: 102 mmol/L (ref 96–106)
CO2: 22 mmol/L (ref 20–29)
Creatinine, Ser: 0.97 mg/dL (ref 0.57–1.00)
GFR calc Af Amer: 78 mL/min/{1.73_m2} (ref >59)
GFR, EST NON AFRICAN AMERICAN: 68 mL/min/{1.73_m2} (ref >59)
GLOBULIN, TOTAL: 2.6 g/dL (ref 1.5–4.5)
Glucose: 159 mg/dL — ABNORMAL HIGH (ref 65–99)
Potassium: 5 mmol/L (ref 3.5–5.2)
SODIUM: 141 mmol/L (ref 134–144)
Total Protein: 6.8 g/dL (ref 6.0–8.5)

## 2017-06-18 ENCOUNTER — Ambulatory Visit
Admission: RE | Admit: 2017-06-18 | Discharge: 2017-06-18 | Disposition: A | Discharge status: Home / Self Care | Payer: BC Managed Care – PPO | Source: Ambulatory Visit | Attending: Family Medicine | Admitting: Family Medicine

## 2017-06-18 DIAGNOSIS — R102 Pelvic and perineal pain: Secondary | ICD-10-CM | POA: Diagnosis present

## 2017-06-25 ENCOUNTER — Telehealth: Payer: Self-pay | Admitting: Family Medicine

## 2017-06-25 NOTE — Telephone Encounter (Signed)
Rachel Walls from [facility] called to let us know that she has tried to contact the pt to schedule an appt three times and has not been able to reach the pt. The pt has now been marked as unable to contact. Zenon MayoSheena can be reached for any questions at [phone].

## 2017-06-25 NOTE — Telephone Encounter (Signed)
Patient called back. She stated she will have to delay sleep study appointment. She did not say why. I gave her Sheena contact # to let her know. Also patient states she has been taking prednisone given to her by orthopedic dr. She has 4 days left of prednisone and Glucose has been in the 300's. She wants to know what she should do. Please advise.

## 2017-06-25 NOTE — Telephone Encounter (Signed)
See note below. Called and left message for patient to call back on home answering machine.

## 2017-06-27 NOTE — Telephone Encounter (Signed)
Call -- have patient RESTART her Glipizide for just one week while taking Prednisone.  If she is OUT of Glipizide, she should increase Basaglar insulin to 65 units daily while taking Prednisone.

## 2017-06-29 ENCOUNTER — Other Ambulatory Visit: Payer: Self-pay | Admitting: Orthopedic Surgery

## 2017-06-29 ENCOUNTER — Other Ambulatory Visit: Payer: Self-pay | Admitting: Family Medicine

## 2017-06-29 DIAGNOSIS — M5416 Radiculopathy, lumbar region: Secondary | ICD-10-CM

## 2017-06-29 DIAGNOSIS — E1142 Type 2 diabetes mellitus with diabetic polyneuropathy: Secondary | ICD-10-CM

## 2017-06-29 NOTE — Telephone Encounter (Signed)
Left detailed message- RESTART her Glipizide for just one week while taking Prednisone.  If she is OUT of Glipizide, she should increase Basaglar insulin to 65 units daily while taking Prednisone. Per Dr. Katrinka BlazingSmith

## 2017-06-30 ENCOUNTER — Other Ambulatory Visit: Payer: Self-pay | Admitting: Orthopedic Surgery

## 2017-06-30 DIAGNOSIS — M5416 Radiculopathy, lumbar region: Secondary | ICD-10-CM

## 2017-07-08 ENCOUNTER — Ambulatory Visit (INDEPENDENT_AMBULATORY_CARE_PROVIDER_SITE_OTHER): Payer: BC Managed Care – PPO | Admitting: Family Medicine

## 2017-07-08 ENCOUNTER — Encounter: Payer: Self-pay | Admitting: Family Medicine

## 2017-07-08 VITALS — BP 117/83 | HR 93 | Temp 98.2°F | Resp 16 | Ht 64.57 in | Wt 281.0 lb

## 2017-07-08 DIAGNOSIS — Z794 Long term (current) use of insulin: Secondary | ICD-10-CM

## 2017-07-08 DIAGNOSIS — R102 Pelvic and perineal pain: Secondary | ICD-10-CM

## 2017-07-08 DIAGNOSIS — M1712 Unilateral primary osteoarthritis, left knee: Secondary | ICD-10-CM

## 2017-07-08 DIAGNOSIS — M5136 Other intervertebral disc degeneration, lumbar region: Secondary | ICD-10-CM

## 2017-07-08 DIAGNOSIS — E1142 Type 2 diabetes mellitus with diabetic polyneuropathy: Secondary | ICD-10-CM | POA: Diagnosis not present

## 2017-07-08 DIAGNOSIS — R5383 Other fatigue: Secondary | ICD-10-CM | POA: Diagnosis not present

## 2017-07-08 LAB — GLUCOSE, POCT (MANUAL RESULT ENTRY): POC GLUCOSE: 117 mg/dL — AB (ref 70–99)

## 2017-07-08 NOTE — Progress Notes (Signed)
Subjective:    Patient ID: Rachel HighlandBeverly A Negash, female    DOB: 02/24/1966, 51 y.o.   MRN: [id number]  07/08/2017  Diabetes (4 week follow-up); Weight Check; and Rash   HPI This 51 y.o. female presents for evaluation of DMII, fatigue, DDD cervical spine, obesity, pelvic pain.  Every meeting went to every Tuesday at [employer].  Was losing 1-2 pounds per week.  Then, went to orthopedist for DDD lumbar spine; prescribed Prednisone for six days.  Sugars went crazy.  Called office when taking Prednisone; sugars increased to 300; when stopped Prednisone, sugars improved.  Wanted to lose 10 pounds because gained 10 pounds last year; wants to get off 10 pounds before starting school.  Our new leader, is very motivated.  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  Shared what she did to make this happen.  Quite motivated.  Starting to walk.  Starting school.  During summer months, does not eating right.  Victoza is causing vomiting; stomach is feeling really weird.  Tried 1.2mg  and was horrible.  Starts school next week.  Scheduled for MRI on Saturday; has Diazepam waiting.  Open MRI.  Still taking Metformin; never started Glipizide.    Also saw orthopedic for knees. Took xrays but have decided that no MRI needed for knees.  Just arthritis.  Recommend physical therapy 2-3 times per week.   Postponed things for knees.  Meloxicam and some other muscle relaxers.  Pelvic pain:  S/p pelvic ultrasound.  L sided pelvic pain.  Intermittent.  Not all the time.  No cycle at all. When has pain, feels like ovary pain with radiation down LEFT leg.   Only occurs once monthly when usually would have menses, lasts that week and then resolves.    Hypersomnolence:  Referred for sleep study.  Has decided to postpone sleep study for now due to MRI; also seeing ortho.     BP Readings from Last 3 Encounters:  07/08/17 117/83  06/09/17 110/80  05/19/17 119/84   Wt Readings from Last 3 Encounters:  07/08/17 281 lb (127.5 kg)  06/09/17 282 lb  (127.9 kg)  05/19/17 282 lb 9.6 oz (128.2 kg)   Immunization History  Administered Date(s) Administered  . Hepatitis A, Adult 09/28/2013, 05/29/2014  . Hepatitis B 03/16/2013  . Hepatitis B, adult 06/27/2013, 09/28/2013  . Influenza Split 08/06/2010  . Influenza,inj,Quad PF,36+ Mos 09/28/2013, 08/28/2014, 08/10/2015, 08/16/2016  . Pneumococcal Conjugate-13 11/01/2014  . Pneumococcal Polysaccharide-23 11/24/1998, 09/28/2013  . Tdap 01/18/2011    Review of Systems  Constitutional: Negative for chills, diaphoresis, fatigue and fever.  Eyes: Negative for visual disturbance.  Respiratory: Negative for cough and shortness of breath.   Cardiovascular: Negative for chest pain, palpitations and leg swelling.  Gastrointestinal: Negative for abdominal pain, constipation, diarrhea, nausea and vomiting.  Endocrine: Negative for cold intolerance, heat intolerance, polydipsia, polyphagia and polyuria.  Genitourinary: Positive for pelvic pain.  Musculoskeletal: Positive for arthralgias.  Skin: Positive for rash.  Neurological: Negative for dizziness, tremors, seizures, syncope, facial asymmetry, speech difficulty, weakness, light-headedness, numbness and headaches.    Past Medical History:  Diagnosis Date  . Allergic rhinitis, cause unspecified   . Anemia   . Anxiety   . Arthritis   . Asthma   . Chronic kidney disease   . Diabetes mellitus    Type 2  . GERD (gastroesophageal reflux disease)   . Hypertension   . Hypertensive retinopathy   . Hypothyroidism   . Nonspecific elevation of levels of transaminase or lactic  acid dehydrogenase (LDH)   . Obesity, unspecified   . Other and unspecified hyperlipidemia   . Psychic factors associated with disease    Psychogenic factors with other diseases  . Unspecified vitamin D deficiency   . Venous engorgement of retina   . Wegener's granulomatosis (HCC)    Past Surgical History:  Procedure Laterality Date  . abdominal ultrasound  05/17/2012     severe hepatomegaly at 22 cm; gallbladder normal.   [facility].  . Calcaneous bone spur surgery     x2  . CT abdomen  06/10/12   Hepatomegaly.  [facility].  [facility] ESOPHAGOGASTRODUODENOSCOPY  08/19/12   diffuse gastritis antrum; pathology negative for malignancy, H. Pylori; esophagus and duodenum normal.  . hida scan  08/27/12   normal; EF 62%.  [facility].  [facility] Kitchen LUNG BIOPSY  1999  . Meniscus tear     L; x 3 tears.  [facility].  [facility] Kitchen RENAL BIOPSY  1999  . 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 TONSILLECTOMY     Allergies  Allergen Reactions  . Penicillins Hives  . Sulfonamide Derivatives     Reaction: arthralgias.    Social History   Social History  . Marital status: Married    Spouse name: Renella Cunas  . Number of children: 0  . Years of education: college   Occupational History  . 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 Smoking status: Never Smoker  . Smokeless tobacco: Never Used     Comment: Tobacco use-no  . Alcohol use No  . Drug use: No  . Sexual activity: Yes    Partners: Male   Other Topics Concern  . Not on file   Social History Narrative   Marital status:  Married x 26 years;happily married; no abuse.      Children: none      Lives: with husband, 3 cats, 1 dog.      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      Tobacco: never      Alcohol: never      Drugs: none      Exercise: none      Seatbelt: 100%; no texting while driving.      Guns: none             Family History  Problem Relation Age of Onset  . Hyperlipidemia Mother        Hypercholesterolemia  . Arthritis Mother        OA  . Sarcoidosis Father   . Heart disease Maternal Grandfather   . Emphysema Maternal Grandfather        Cause of death  . Diabetes Maternal Grandfather   . Heart failure Paternal Grandfather        CHF  . Heart disease Paternal Grandfather   . Asthma Brother   . Alcohol  abuse Brother   . Emphysema Maternal Grandmother        Objective:    BP 117/83   Pulse 93   Temp 98.2 F (36.8 C) (Oral)   Resp 16   Ht 5' 4.57" (1.64 m)   Wt 281 lb (127.5 kg)   LMP 06/24/2015 Comment: per patient periods just decided to stop  SpO2 97%   BMI 47.39 kg/m  Physical Exam  Constitutional: She is oriented to person, place, and time. She appears well-developed  and well-nourished. No distress.  HENT:  Head: Normocephalic and atraumatic.  Right Ear: External ear normal.  Left Ear: External ear normal.  Nose: Nose normal.  Mouth/Throat: Oropharynx is clear and moist.  Eyes: Pupils are equal, round, and reactive to light. Conjunctivae and EOM are normal.  Neck: Normal range of motion. Neck supple. Carotid bruit is not present. No thyromegaly present.  Cardiovascular: Normal rate, regular rhythm, normal heart sounds and intact distal pulses.  Exam reveals no gallop and no friction rub.   No murmur heard. Pulmonary/Chest: Effort normal and breath sounds normal. She has no wheezes. She has no rales.  Abdominal: Soft. Bowel sounds are normal. She exhibits no distension and no mass. There is no tenderness. There is no rebound and no guarding.  Lymphadenopathy:    She has no cervical adenopathy.  Neurological: She is alert and oriented to person, place, and time. No cranial nerve deficit.  Skin: Skin is warm and dry. No rash noted. She is not diaphoretic. No erythema. No pallor.  Psychiatric: She has a normal mood and affect. Her behavior is normal.  Nursing note and vitals reviewed.  Results for orders placed or performed in visit on 07/08/17  POCT glucose (manual entry)  Result Value Ref Range   POC Glucose 117 (A) 70 - 99 mg/dl   No results found. Depression screen [facility] 2/9 07/08/2017 06/09/2017 05/19/2017 02/25/2017 11/25/2016  Decreased Interest 0 0 0 0 0  Down, Depressed, Hopeless 0 0 0 0 0  PHQ - 2 Score 0 0 0 0 0   Fall Risk  07/08/2017 06/09/2017 05/19/2017 02/25/2017  11/25/2016  Falls in the past year? No No No No No  Number falls in past yr: - - - - -  Injury with Fall? - - - - -  Comment - - - - -        Assessment & Plan:   1. Type 2 diabetes mellitus with diabetic polyneuropathy, with long-term current use of insulin (HCC)   2. Morbid obesity (HCC)   3. Other fatigue   4. Degenerative disc disease, lumbar    -improved glycemic control with weight loss attempts; recent steroid therapy for DDD lumbar has interfered with weight loss GOAL: 10 POUNDS IN TWO MONTHS -s/p ortho consultation for knee pain B and lower back pain.  S/p steroids. -s/p pelvic US for L pelvic pain that occurs once monthly when should be having menses; recommend monitoring; if increases in frequency or severity, refer to gynecology. -pt declined sleep study at this time due to financial restrictions.  Orders Placed This Encounter  Procedures  . POCT glucose (manual entry)   Meds ordered this encounter  Medications  . meloxicam (MOBIC) 15 MG tablet    Sig: Take 15 mg by mouth as needed for pain.    No Follow-up on file.   Creig Landin Paulita Fujita, M.D. Primary Care at [facility] [address] [place], [place]  [address] [phone] phone [phone] fax

## 2017-07-08 NOTE — Patient Instructions (Addendum)
   IF you received an x-ray today, you will receive an invoice from [facility]. Please contact [facility] at [phone] with questions or concerns regarding your invoice.   IF you received labwork today, you will receive an invoice from [facility]. Please contact [facility] at [phone] with questions or concerns regarding your invoice.   Our billing staff will not be able to assist you with questions regarding bills from these companies.  You will be contacted with the lab results as soon as they are available. The fastest way to get your results is to activate your My Chart account. Instructions are located on the last page of this paperwork. If you have not heard from us regarding the results in 2 weeks, please contact this office.     Diabetes Mellitus and Exercise Exercising regularly is important for your overall health, especially when you have diabetes (diabetes mellitus). Exercising is not only about losing weight. It has many health benefits, such as increasing muscle strength and bone density and reducing body fat and stress. This leads to improved fitness, flexibility, and endurance, all of which result in better overall health. Exercise has additional benefits for people with diabetes, including:  Reducing appetite.  Helping to lower and control blood glucose.  Lowering blood pressure.  Helping to control amounts of fatty substances (lipids) in the blood, such as cholesterol and triglycerides.  Helping the body to respond better to insulin (improving insulin sensitivity).  Reducing how much insulin the body needs.  Decreasing the risk for heart disease by: ? Lowering cholesterol and triglyceride levels. ? Increasing the levels of good cholesterol. ? Lowering blood glucose levels.  What is my activity plan? Your health care provider or certified diabetes educator can help you make a plan for the type and frequency of exercise (activity plan) that  works for you. Make sure that you:  Do at least 150 minutes of moderate-intensity or vigorous-intensity exercise each week. This could be brisk walking, biking, or water aerobics. ? Do stretching and strength exercises, such as yoga or weightlifting, at least 2 times a week. ? Spread out your activity over at least 3 days of the week.  Get some form of physical activity every day. ? Do not go more than 2 days in a row without some kind of physical activity. ? Avoid being inactive for more than 90 minutes at a time. Take frequent breaks to walk or stretch.  Choose a type of exercise or activity that you enjoy, and set realistic goals.  Start slowly, and gradually increase the intensity of your exercise over time.  What do I need to know about managing my diabetes?  Check your blood glucose before and after exercising. ? If your blood glucose is higher than 240 mg/dL (13.3 mmol/L) before you exercise, check your urine for ketones. If you have ketones in your urine, do not exercise until your blood glucose returns to normal.  Know the symptoms of low blood glucose (hypoglycemia) and how to treat it. Your risk for hypoglycemia increases during and after exercise. Common symptoms of hypoglycemia can include: ? Hunger. ? Anxiety. ? Sweating and feeling clammy. ? Confusion. ? Dizziness or feeling light-headed. ? Increased heart rate or palpitations. ? Blurry vision. ? Tingling or numbness around the mouth, lips, or tongue. ? Tremors or shakes. ? Irritability.  Keep a rapid-acting carbohydrate snack available before, during, and after exercise to help prevent or treat hypoglycemia.  Avoid injecting insulin into areas of the body   that are going to be exercised. For example, avoid injecting insulin into: ? The arms, when playing tennis. ? The legs, when jogging.  Keep records of your exercise habits. Doing this can help you and your health care provider adjust your diabetes management plan  as needed. Write down: ? Food that you eat before and after you exercise. ? Blood glucose levels before and after you exercise. ? The type and amount of exercise you have done. ? When your insulin is expected to peak, if you use insulin. Avoid exercising at times when your insulin is peaking.  When you start a new exercise or activity, work with your health care provider to make sure the activity is safe for you, and to adjust your insulin, medicines, or food intake as needed.  Drink plenty of water while you exercise to prevent dehydration or heat stroke. Drink enough fluid to keep your urine clear or pale yellow. This information is not intended to replace advice given to you by your health care provider. Make sure you discuss any questions you have with your health care provider. Document Released: 01/31/2004 Document Revised: 05/30/2016 Document Reviewed: 04/21/2016 Elsevier Interactive Patient Education  2018 [employer]  

## 2017-07-11 ENCOUNTER — Ambulatory Visit
Admission: RE | Admit: 2017-07-11 | Discharge: 2017-07-11 | Disposition: A | Discharge status: Home / Self Care | Payer: BC Managed Care – PPO | Source: Ambulatory Visit | Attending: Orthopedic Surgery | Admitting: Orthopedic Surgery

## 2017-07-11 ENCOUNTER — Other Ambulatory Visit: Payer: BC Managed Care – PPO

## 2017-07-11 DIAGNOSIS — M5416 Radiculopathy, lumbar region: Secondary | ICD-10-CM

## 2017-07-20 ENCOUNTER — Other Ambulatory Visit: Payer: Self-pay | Admitting: Orthopedic Surgery

## 2017-07-20 DIAGNOSIS — M533 Sacrococcygeal disorders, not elsewhere classified: Secondary | ICD-10-CM

## 2017-07-23 ENCOUNTER — Other Ambulatory Visit: Payer: Self-pay | Admitting: Family Medicine

## 2017-08-31 ENCOUNTER — Ambulatory Visit (INDEPENDENT_AMBULATORY_CARE_PROVIDER_SITE_OTHER): Payer: BC Managed Care – PPO

## 2017-08-31 ENCOUNTER — Ambulatory Visit (INDEPENDENT_AMBULATORY_CARE_PROVIDER_SITE_OTHER): Payer: BC Managed Care – PPO | Admitting: Family Medicine

## 2017-08-31 ENCOUNTER — Encounter: Payer: Self-pay | Admitting: Family Medicine

## 2017-08-31 VITALS — BP 118/70 | HR 119 | Temp 98.0°F | Resp 16 | Ht 64.96 in | Wt 281.0 lb

## 2017-08-31 DIAGNOSIS — Z23 Encounter for immunization: Secondary | ICD-10-CM | POA: Diagnosis not present

## 2017-08-31 DIAGNOSIS — M25462 Effusion, left knee: Secondary | ICD-10-CM

## 2017-08-31 DIAGNOSIS — M25562 Pain in left knee: Secondary | ICD-10-CM

## 2017-08-31 DIAGNOSIS — M1712 Unilateral primary osteoarthritis, left knee: Secondary | ICD-10-CM

## 2017-08-31 MED ORDER — HYDROCODONE-ACETAMINOPHEN 5-325 MG PO TABS: 1.0000 | ORAL_TABLET | Freq: Four times a day (QID) | ORAL | 0 refills | Status: DC | PRN

## 2017-08-31 NOTE — Progress Notes (Signed)
Subjective:    Patient ID: Rachel Walls, female    DOB: 1966/02/11, 51 y.o.   MRN: [id number]  08/31/2017  Knee Pain (with swelling x 5 days left knee)    HPI This 51 y.o. female presents for evaluation of L KNEE PAIN.   Onset four days ago.  No injury.  Started swelling and now leg is swelling.    Ice is not working.  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    Meniscus surgery seven years ago.  Tramadol not effective.  Upon review of chart, last knee films in 07/2017 when suffered with knee pain with swelling without injury.  Knee films at that visit showed medial osteoarthritis.    BP Readings from Last 3 Encounters:  08/31/17 118/70  07/08/17 117/83  06/09/17 110/80   Wt Readings from Last 3 Encounters:  08/31/17 281 lb (127.5 kg)  07/08/17 281 lb (127.5 kg)  06/09/17 282 lb (127.9 kg)   Immunization History  Administered Date(s) Administered  . Hepatitis A, Adult 09/28/2013, 05/29/2014  . Hepatitis B 03/16/2013  . Hepatitis B, adult 06/27/2013, 09/28/2013  . Influenza Split 08/06/2010  . Influenza,inj,Quad PF,6+ Mos 09/28/2013, 08/28/2014, 08/10/2015, 08/16/2016, 08/31/2017  . Pneumococcal Conjugate-13 11/01/2014  . Pneumococcal Polysaccharide-23 11/24/1998, 09/28/2013  . Tdap 01/18/2011    Review of Systems  Constitutional: Negative for chills, diaphoresis, fatigue and fever.  HENT: Negative for ear pain, postnasal drip, rhinorrhea, sinus pressure, sore throat and trouble swallowing.   Respiratory: Negative for cough and shortness of breath.   Cardiovascular: Negative for chest pain, palpitations and leg swelling.  Gastrointestinal: Negative for abdominal pain, constipation, diarrhea, nausea and vomiting.  Musculoskeletal: Positive for arthralgias, gait problem, joint swelling and myalgias.  Neurological: Negative for weakness and numbness.    Past Medical History:  Diagnosis Date  . Allergic rhinitis, cause  unspecified   . Anemia   . Anxiety   . Arthritis   . Asthma   . Chronic kidney disease   . Diabetes mellitus    Type 2  . GERD (gastroesophageal reflux disease)   . Hypertension   . Hypertensive retinopathy   . Hypothyroidism   . Nonspecific elevation of levels of transaminase or lactic acid dehydrogenase (LDH)   . Obesity, unspecified   . Other and unspecified hyperlipidemia   . Psychic factors associated with disease    Psychogenic factors with other diseases  . Unspecified vitamin D deficiency   . Venous engorgement of retina   . Wegener's granulomatosis (HCC)    Past Surgical History:  Procedure Laterality Date  . abdominal ultrasound  05/17/2012   severe hepatomegaly at 22 cm; gallbladder normal.   [facility].  . Calcaneous bone spur surgery     x2  . CT abdomen  06/10/12   Hepatomegaly.  [facility].  Marland Kitchen ESOPHAGOGASTRODUODENOSCOPY  08/19/12   diffuse gastritis antrum; pathology negative for malignancy, H. Pylori; esophagus and duodenum normal.  . hida scan  08/27/12   normal; EF 62%.  [facility].  Marland Kitchen LUNG BIOPSY  1999  . Meniscus tear     L; x 3 tears.  [facility].  Marland Kitchen RENAL BIOPSY  1999  . 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 TONSILLECTOMY     Allergies  Allergen Reactions  . Penicillins Hives  . Sulfonamide Derivatives     Reaction: arthralgias.   Current Outpatient Prescriptions on File Prior to Visit  Medication Sig Dispense  Refill  . atorvastatin (LIPITOR) 10 MG tablet Take 1 tablet (10 mg total) by mouth daily. 90 tablet 3  . celecoxib (CELEBREX) 200 MG capsule Take 1 capsule (200 mg total) by mouth 2 (two) times daily. 60 capsule 0  . DULoxetine (CYMBALTA) 60 MG capsule Take 1 capsule (60 mg total) by mouth 2 (two) times daily. 180 capsule 3  . empagliflozin (JARDIANCE) 25 MG TABS tablet Take 25 mg by mouth daily. 30 tablet 5  . fluticasone (FLONASE) 50 MCG/ACT nasal spray Place 2 sprays into both nostrils daily. 16 g 11  . gabapentin  (NEURONTIN) 800 MG tablet Take 1 tablet (800 mg total) by mouth 3 (three) times daily. 270 tablet 1  . GLIPIZIDE XL 10 MG 24 hr tablet Take 1 tablet (10 mg total) by mouth daily with breakfast. 90 tablet 3  . glucose blood test strip Use as instructed--check sugar tid 100 each 11  . Insulin Glargine (BASAGLAR KWIKPEN) 100 UNIT/ML SOPN Inject 46 Units into the skin daily at 10 pm. 15 mL 11  . Insulin Pen Needle (PEN NEEDLES) 32G X 4 MM MISC Inject 1 each into the skin daily. (DX: E11.9) 100 each 4  . lansoprazole (PREVACID) 30 MG capsule Take 1 capsule (30 mg total) by mouth daily. 90 capsule 3  . levothyroxine (SYNTHROID, LEVOTHROID) 100 MCG tablet Take 1 tablet (100 mcg total) by mouth daily. 90 tablet 3  . meloxicam (MOBIC) 15 MG tablet Take 15 mg by mouth as needed for pain.    . metFORMIN (GLUCOPHAGE) 500 MG tablet Take 1 tablet (500 mg total) by mouth every evening. With food. 90 tablet 0  . mycophenolate (CELLCEPT) 250 MG capsule Take 250 mg by mouth at bedtime. 2 tab in the morning & 1 tab at night.    . ramipril (ALTACE) 5 MG capsule Take 1 capsule (5 mg total) by mouth daily. 90 capsule 3  . Vitamin D, Ergocalciferol, (DRISDOL) 50000 units CAPS capsule Take 1 capsule (50,000 Units total) by mouth every 7 (seven) days. 12 capsule 1  . albuterol (PROVENTIL HFA;VENTOLIN HFA) 108 (90 BASE) MCG/ACT inhaler Inhale 90 mcg into the lungs every 4 (four) hours as needed.     No current facility-administered medications on file prior to visit.    Social History   Social History  . Marital status: Married    Spouse name: Renella Cunas  . Number of children: 0  . Years of education: college   Occupational History  . 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 Smoking status: Never Smoker  . Smokeless tobacco: Never Used     Comment: Tobacco use-no  . Alcohol use No  . Drug use: No  . Sexual activity: Yes    Partners: Male   Other Topics Concern  . Not on file    Social History Narrative   Marital status:  Married x 26 years;happily married; no abuse.      Children: none      Lives: with husband, 3 cats, 1 dog.      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      Tobacco: never      Alcohol: never      Drugs: none      Exercise: none      Seatbelt: 100%; no texting while driving.      Guns: none  Family History  Problem Relation Age of Onset  . Hyperlipidemia Mother        Hypercholesterolemia  . Arthritis Mother        OA  . Sarcoidosis Father   . Heart disease Maternal Grandfather   . Emphysema Maternal Grandfather        Cause of death  . Diabetes Maternal Grandfather   . Heart failure Paternal Grandfather        CHF  . Heart disease Paternal Grandfather   . Asthma Brother   . Alcohol abuse Brother   . Emphysema Maternal Grandmother        Objective:    BP 118/70   Pulse (!) 119   Temp 98 F (36.7 C) (Oral)   Resp 16   Ht 5' 4.96" (1.65 m)   Wt 281 lb (127.5 kg)   LMP 06/24/2015 Comment: per patient periods just decided to stop  SpO2 95%   BMI 46.82 kg/m  Physical Exam  Constitutional: She is oriented to person, place, and time. She appears well-developed and well-nourished. No distress.  HENT:  Head: Normocephalic and atraumatic.  Eyes: Pupils are equal, round, and reactive to light. Conjunctivae are normal.  Neck: Normal range of motion. Neck supple.  Cardiovascular: Normal rate, regular rhythm and normal heart sounds.  Exam reveals no gallop and no friction rub.   No murmur heard. Pulmonary/Chest: Effort normal and breath sounds normal. She has no wheezes. She has no rales.  Musculoskeletal:       Left knee: She exhibits decreased range of motion, swelling and effusion. She exhibits no ecchymosis, no deformity, no laceration, no erythema, normal alignment, no LCL laxity, normal patellar mobility, no bony tenderness, normal meniscus and no MCL  laxity. Tenderness found. Medial joint line tenderness noted. No lateral joint line, no MCL, no LCL and no patellar tendon tenderness noted.       Left lower leg: Normal. She exhibits no tenderness and no bony tenderness.  L KNEE: +mild to moderate effusion; pain with flexion more than extension; +TTP medial joint line; McMurrays negative; Lachman's negative; V/V strain intact.  Neurological: She is alert and oriented to person, place, and time.  Skin: She is not diaphoretic.  Psychiatric: She has a normal mood and affect. Her behavior is normal.  Nursing note and vitals reviewed.  Dg Knee Complete 4 Views Left  Result Date: 08/31/2017 CLINICAL DATA:  LEFT medial pain and swelling, no injury EXAM: LEFT KNEE - COMPLETE 4+ VIEW COMPARISON:  08/16/2016 FINDINGS: Mild osseous demineralization. Medial compartment joint space narrowing and minimal spur formation. Mild patellofemoral joint space narrowing. No acute fracture, dislocation, or bone destruction. Probable small knee joint effusion. IMPRESSION: Degenerative changes LEFT knee with probable small knee joint effusion. No acute osseous abnormalities. Electronically Signed   By: Ulyses Southward M.D.   On: 08/31/2017 18:10   Depression screen [facility] 2/9 08/31/2017 07/08/2017 06/09/2017 05/19/2017 02/25/2017  Decreased Interest 0 0 0 0 0  Down, Depressed, Hopeless 0 0 0 0 0  PHQ - 2 Score 0 0 0 0 0   Fall Risk  08/31/2017 07/08/2017 06/09/2017 05/19/2017 02/25/2017  Falls in the past year? No No No No No  Number falls in past yr: - - - - -  Injury with Fall? - - - - -  Comment - - - - -        Assessment & Plan:   1. Pain and swelling of knee, left   2. Primary osteoarthritis  of left knee   3. Need for prophylactic vaccination and inoculation against influenza    -recurrent knee pain with swelling at the beginning of academic year; suspect that change in activity with beginning of school year is triggering/exacerbating osteoarthritis of LEFT knee.   Recommend continued rest, icing, Meloxicam or Celebrex and not both.  Home exercise program provided to perform.  If no improvement in one month, recommend orthopedic consultation.  Patient currently seeing orthopedist at [facility]. -rx for hydrocodone provided for nighttime pain; also recommend OTC Lidoderm patches 4% during the day.   Orders Placed This Encounter  Procedures  . DG Knee Complete 4 Views Left    Standing Status:   Future    Number of Occurrences:   1    Standing Expiration Date:   08/31/2018    Order Specific Question:   Reason for Exam (SYMPTOM  OR DIAGNOSIS REQUIRED)    Answer:   L medial pain with swelling; no injury    Order Specific Question:   Is the patient pregnant?    Answer:   No    Order Specific Question:   Preferred imaging location?    Answer:   External  . Flu Vaccine QUAD 36+ mos IM   Meds ordered this encounter  Medications  . HYDROcodone-acetaminophen (NORCO) 5-325 MG tablet    Sig: Take 1 tablet by mouth every 6 (six) hours as needed.    Dispense:  30 tablet    Refill:  0    No Follow-up on file.   Salam Micucci Paulita Fujita, M.D. Primary Care at [facility] previously [facility] [address] [place], [place]  [address] [phone] phone [phone] fax

## 2017-08-31 NOTE — Patient Instructions (Addendum)
Lidoderm 4% patches --- apply to knee for pain.   Medial Collateral Knee Ligament Sprain, Phase I Rehab Ask your health care provider which exercises are safe for you. Do exercises exactly as told by your health care provider and adjust them as directed. It is normal to feel mild stretching, pulling, tightness, or discomfort as you do these exercises, but you should stop right away if you feel sudden pain or your pain gets worse.Do not begin these exercises until told by your health care provider. Stretching and range of motion exercises These exercises warm up your muscles and joints and improve the movement and flexibility of your knee. These exercises also help to relieve pain, numbness, and tingling. Exercise A: Knee flexion, passive 1. Start this exercise in one of these positions: ? Lying on the floor in front of an open doorway, with your left / right heel and foot lightly touching the wall. ? Lying on the floor with both feet on the wall. 2. Without using any effort, allow gravity to let your foot slide down the wall slowly until you feel a gentle stretch in the front of your left / right knee. 3. Hold this stretch for __________ seconds. 4. Return your leg to the starting position, using your healthy leg to do the work or to help if needed. Repeat __________ times. Complete this stretch __________ times a day. Exercise B: Knee flexion, active  1. Lie on your back with both knees straight. If this causes back discomfort, bend your healthy knee so your foot is flat on the floor. 2. Slowly slide your left / right heel back toward your buttocks until you feel a gentle stretch in the front of your knee or thigh. 3. Hold this position for __________ seconds. 4. Slowly slide your left / right heel back to the starting position. Repeat __________ times. Complete this exercise __________ times a day. Exercise C: Knee extension, sitting 1. Sit with your left / right heel propped on a chair,  a coffee table, or a footstool. Do not have anything under your knee to support it. 2. Allow your leg muscles to relax, letting gravity straighten out your knee. Do not let your knee roll inward. You should feel a stretch behind your left / right knee. 3. If told by your health care provider, deepen the stretch by placing a __________ weight on your thigh, just above your kneecap. 4. Hold this position for __________ seconds. Repeat __________ times. Complete this stretch __________ times a day. Strengthening exercises These exercises build strength and endurance in your knee. Endurance is the ability to use your muscles for a long time, even after they get tired. Isometric exercises involve squeezing your muscles but not moving your knee. Exercise D: Quadriceps, isometric  1. Lie on your back with your left / right leg extended and your other knee bent. 2. If told by your health care provider, put a rolled towel or small pillow under your left / right knee. 3. Slowly tense the muscles in the front of your left / right thigh by pushing your knee down. You should see your kneecap slide up toward your hip or see increased dimpling just above the knee. 4. For __________ seconds, keep the muscle as tight as you can without increasing your pain. 5. Relax your muscles slowly and completely. Repeat __________ times. Complete this exercise __________ times a day. Exercise E: Hamstring, isometric  1. Lie on your back on a firm surface. 2. [place] your  left / right knee about __________ degrees. You can prop your knee on a pillow if needed. 3. Dig your heel down and back into the surface as if you are trying to pull your heel toward your buttocks. Tighten the muscles in the back of your thighs to "dig" as hard as you can without increasing any pain. 4. Hold this position for __________ seconds. 5. Relax your muscles slowly and completely. Repeat __________ times. Complete this exercise __________ times a  day. This information is not intended to replace advice given to you by your health care provider. Make sure you discuss any questions you have with your health care provider. Document Released: 11/10/2005 Document Revised: 07/17/2016 Document Reviewed: 09/22/2015 Elsevier Interactive Patient Education  2018 [employer].    IF you received an x-ray today, you will receive an invoice from [facility]. Please contact [facility] at [phone] with questions or concerns regarding your invoice.   IF you received labwork today, you will receive an invoice from [facility]. Please contact [facility] at [phone] with questions or concerns regarding your invoice.   Our billing staff will not be able to assist you with questions regarding bills from these companies.  You will be contacted with the lab results as soon as they are available. The fastest way to get your results is to activate your My Chart account. Instructions are located on the last page of this paperwork. If you have not heard from [place] regarding the results in 2 weeks, please contact this office.

## 2017-09-01 ENCOUNTER — Encounter: Payer: Self-pay | Admitting: Family Medicine

## 2017-09-09 ENCOUNTER — Ambulatory Visit: Payer: BC Managed Care – PPO | Admitting: Family Medicine

## 2017-09-15 ENCOUNTER — Ambulatory Visit
Admission: RE | Admit: 2017-09-15 | Discharge: 2017-09-15 | Disposition: A | Discharge status: Home / Self Care | Payer: BC Managed Care – PPO | Source: Ambulatory Visit | Attending: Orthopedic Surgery | Admitting: Orthopedic Surgery

## 2017-09-15 DIAGNOSIS — M533 Sacrococcygeal disorders, not elsewhere classified: Secondary | ICD-10-CM

## 2017-09-24 ENCOUNTER — Other Ambulatory Visit: Payer: Self-pay | Admitting: Family Medicine

## 2017-09-24 DIAGNOSIS — E1142 Type 2 diabetes mellitus with diabetic polyneuropathy: Secondary | ICD-10-CM

## 2017-09-28 ENCOUNTER — Other Ambulatory Visit: Payer: Self-pay | Admitting: Family Medicine

## 2017-10-05 ENCOUNTER — Other Ambulatory Visit: Payer: Self-pay | Admitting: Family Medicine

## 2017-10-05 NOTE — Telephone Encounter (Signed)
Test strips

## 2017-10-19 ENCOUNTER — Other Ambulatory Visit: Payer: Self-pay | Admitting: Family Medicine

## 2017-10-28 ENCOUNTER — Ambulatory Visit: Payer: Self-pay | Admitting: Family Medicine

## 2017-10-30 ENCOUNTER — Ambulatory Visit: Payer: BC Managed Care – PPO | Admitting: Family Medicine

## 2017-10-30 ENCOUNTER — Encounter: Payer: Self-pay | Admitting: Family Medicine

## 2017-10-30 ENCOUNTER — Other Ambulatory Visit: Payer: Self-pay

## 2017-10-30 VITALS — BP 128/78 | HR 118 | Temp 98.0°F | Resp 16 | Ht 64.96 in | Wt 279.0 lb

## 2017-10-30 DIAGNOSIS — K219 Gastro-esophageal reflux disease without esophagitis: Secondary | ICD-10-CM | POA: Diagnosis not present

## 2017-10-30 DIAGNOSIS — E7800 Pure hypercholesterolemia, unspecified: Secondary | ICD-10-CM

## 2017-10-30 DIAGNOSIS — J301 Allergic rhinitis due to pollen: Secondary | ICD-10-CM

## 2017-10-30 DIAGNOSIS — E034 Atrophy of thyroid (acquired): Secondary | ICD-10-CM | POA: Diagnosis not present

## 2017-10-30 DIAGNOSIS — I10 Essential (primary) hypertension: Secondary | ICD-10-CM | POA: Diagnosis not present

## 2017-10-30 DIAGNOSIS — E1142 Type 2 diabetes mellitus with diabetic polyneuropathy: Secondary | ICD-10-CM

## 2017-10-30 DIAGNOSIS — G4710 Hypersomnia, unspecified: Secondary | ICD-10-CM | POA: Diagnosis not present

## 2017-10-30 LAB — GLUCOSE, POCT (MANUAL RESULT ENTRY): POC Glucose: 166 mg/dl — AB (ref 70–99)

## 2017-10-30 LAB — POCT GLYCOSYLATED HEMOGLOBIN (HGB A1C): HEMOGLOBIN A1C: 8.6

## 2017-10-30 MED ORDER — PEN NEEDLES 32G X 4 MM MISC: 1.0000 | Freq: Every day | 4 refills | Status: AC

## 2017-10-30 MED ORDER — LANSOPRAZOLE 30 MG PO CPDR: 30.0000 mg | DELAYED_RELEASE_CAPSULE | Freq: Every day | ORAL | 3 refills | Status: AC

## 2017-10-30 MED ORDER — EMPAGLIFLOZIN 25 MG PO TABS: 25.0000 mg | ORAL_TABLET | Freq: Every day | ORAL | 3 refills | Status: AC

## 2017-10-30 MED ORDER — DULOXETINE HCL 60 MG PO CPEP: 60.0000 mg | ORAL_CAPSULE | Freq: Two times a day (BID) | ORAL | 3 refills | Status: AC

## 2017-10-30 MED ORDER — LEVOTHYROXINE SODIUM 100 MCG PO TABS: 100.0000 ug | ORAL_TABLET | Freq: Every day | ORAL | 3 refills | Status: AC

## 2017-10-30 MED ORDER — BASAGLAR KWIKPEN 100 UNIT/ML ~~LOC~~ SOPN: 64.0000 [IU] | PEN_INJECTOR | Freq: Every day | SUBCUTANEOUS | 11 refills | Status: AC

## 2017-10-30 MED ORDER — METFORMIN HCL 500 MG PO TABS: 500.0000 mg | ORAL_TABLET | Freq: Two times a day (BID) | ORAL | 1 refills | Status: DC

## 2017-10-30 NOTE — Progress Notes (Signed)
Subjective:    Patient ID: Rachel Walls, female    DOB: 08-Jan-1966, 51 y.o.   MRN: [id number]  10/30/2017  Diabetes (3 month follow-up ); Hyperlipidemia; Hypertension; Hypothyroidism; and Allergic Rhinitis     HPI This 51 y.o. female presents for four month follow-up of DMII, hypertension, hypercholesterolemia. Disappointed in lack of success with weight loss.  If there is food and in a certain zone, will not be able to resist.  Concluded this temptation. Usually will monitor portion sizes really well.  Has a fit bit.  Goal is 5,000-6,000 per day.  Goal is to increase to 10,000 steps per day.   Got to get out of my head.  Educated and knows what needs to be done.   Sugars running 150s.  A few days, where over 200.  Always know what did it.  When eats pizza, sugars run high.  Stopped Glipizide at last visit.   Taking Metformin 500mg  in the evenings.  Tolerating Metformin without side effects.   Would like to increase Metformin to 1000mg  daily. Insulin 56 units. Drinks excessive water.   Comfort eats when had a bad day.   Never drinks soda; only drinks water.    Congestion for several days.  Sore in nose as well.  Takes claritin.  School is going well; back in sixth grade with babies.    Episodes when becomes foggy and wants to fall asleep. Sugars are not really high or low during these episodes.  Onset two years ago.  Not productive. Getting 6-7 hours of sleep per night which is normal.   Jumps out of bed every morning.  Naps on weekends.  Falls asleep in car when on trip yet onset in childhood.  Postponed sleep study.  If walks around, symptoms resolve.  Taking a nap will treat symptoms.  Knee and SI joint.     BP Readings from Last 3 Encounters:  10/30/17 128/78  08/31/17 118/70  07/08/17 117/83   Wt Readings from Last 3 Encounters:  10/30/17 279 lb (126.6 kg)  08/31/17 281 lb (127.5 kg)  07/08/17 281 lb (127.5 kg)   Immunization History  Administered Date(s)  Administered  . Hepatitis A, Adult 09/28/2013, 05/29/2014  . Hepatitis B 03/16/2013  . Hepatitis B, adult 06/27/2013, 09/28/2013  . Influenza Split 08/06/2010  . Influenza,inj,Quad PF,6+ Mos 09/28/2013, 08/28/2014, 08/10/2015, 08/16/2016, 08/31/2017  . Pneumococcal Conjugate-13 11/01/2014  . Pneumococcal Polysaccharide-23 11/24/1998, 09/28/2013  . Tdap 01/18/2011    Review of Systems  Constitutional: Negative for chills, diaphoresis, fatigue and fever.  HENT: Positive for congestion, postnasal drip and rhinorrhea. Negative for ear pain, sinus pressure, sinus pain, sneezing and sore throat.   Eyes: Negative for visual disturbance.  Respiratory: Negative for cough and shortness of breath.   Cardiovascular: Negative for chest pain, palpitations and leg swelling.  Gastrointestinal: Negative for abdominal pain, constipation, diarrhea, nausea and vomiting.  Endocrine: Negative for cold intolerance, heat intolerance, polydipsia, polyphagia and polyuria.  Musculoskeletal: Positive for arthralgias and back pain.  Neurological: Negative for dizziness, tremors, seizures, syncope, facial asymmetry, speech difficulty, weakness, light-headedness, numbness and headaches.  Psychiatric/Behavioral: Negative for dysphoric mood. The patient is not nervous/anxious.     Past Medical History:  Diagnosis Date  . Allergic rhinitis, cause unspecified   . Anemia   . Anxiety   . Arthritis   . Asthma   . Chronic kidney disease   . Diabetes mellitus    Type 2  . GERD (gastroesophageal reflux disease)   .  Hypertension   . Hypertensive retinopathy   . Hypothyroidism   . Nonspecific elevation of levels of transaminase or lactic acid dehydrogenase (LDH)   . Obesity, unspecified   . Other and unspecified hyperlipidemia   . Psychic factors associated with disease    Psychogenic factors with other diseases  . Unspecified vitamin D deficiency   . Venous engorgement of retina   . Wegener's granulomatosis (HCC)     Past Surgical History:  Procedure Laterality Date  . abdominal ultrasound  05/17/2012   severe hepatomegaly at 22 cm; gallbladder normal.   [facility].  . Calcaneous bone spur surgery     x2  . CT abdomen  06/10/12   Hepatomegaly.  [facility].  [facility] Kitchen. ESOPHAGOGASTRODUODENOSCOPY  08/19/12   diffuse gastritis antrum; pathology negative for malignancy, H. Pylori; esophagus and duodenum normal.  . hida scan  08/27/12   normal; EF 62%.  [facility].  [facility] Kitchen. LUNG BIOPSY  1999  . Meniscus tear     L; x 3 tears.  [facility]  Marland Kitchen. RENAL BIOPSY  1999  . 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 TONSILLECTOMY     Allergies  Allergen Reactions  . Penicillins Hives  . Sulfonamide Derivatives     Reaction: arthralgias.   Current Outpatient Medications on File Prior to Visit  Medication Sig Dispense Refill  . atorvastatin (LIPITOR) 10 MG tablet Take 1 tablet (10 mg total) by mouth daily. 90 tablet 3  . fluticasone (FLONASE) 50 MCG/ACT nasal spray Place 2 sprays into both nostrils daily. 16 g 11  . gabapentin (NEURONTIN) 800 MG tablet Take 1 tablet (800 mg total) by mouth 3 (three) times daily. 270 tablet 1  . glucose blood (ONE TOUCH ULTRA TEST) test strip Use as instructed--check sugar 3 TIMES A DAY. 100 each 0  . mycophenolate (CELLCEPT) 250 MG capsule Take 250 mg by mouth at bedtime. 2 tab in the morning & 1 tab at night.    . ramipril (ALTACE) 5 MG capsule Take 1 capsule (5 mg total) by mouth daily. 90 capsule 3  . Vitamin D, Ergocalciferol, (DRISDOL) 50000 units CAPS capsule Take 1 capsule (50,000 Units total) by mouth every 7 (seven) days. 12 capsule 1  . albuterol (PROVENTIL HFA;VENTOLIN HFA) 108 (90 BASE) MCG/ACT inhaler Inhale 90 mcg into the lungs every 4 (four) hours as needed.     No current facility-administered medications on file prior to visit.    Social History   Socioeconomic History  . Marital status: Married    Spouse name: Renella CunasShawn Kirkman  . Number of children: 0  .  Years of education: college  . Highest education level: Not on file  Social Needs  . Financial resource strain: Not on file  . Food insecurity - worry: Not on file  . Food insecurity - inability: Not on file  . Transportation needs - medical: Not on file  . Transportation needs - non-medical: Not on file  Occupational History  . Occupation: TEACHER    Employer: [employer]    Comment: Full time  Tobacco Use  . Smoking status: Never Smoker  . Smokeless tobacco: Never Used  . Tobacco comment: Tobacco use-no  Substance and Sexual Activity  . Alcohol use: No  . Drug use: No  . Sexual activity: Yes    Partners: Male  Other Topics Concern  . Not on file  Social History Narrative   Marital status:  Married x 26 years;happily married; no abuse.  Children: none      Lives: with husband, 3 cats, 1 dog.      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      Tobacco: never      Alcohol: never      Drugs: none      Exercise: none      Seatbelt: 100%; no texting while driving.      Guns: none          Family History  Problem Relation Age of Onset  . Hyperlipidemia Mother        Hypercholesterolemia  . Arthritis Mother        OA  . Sarcoidosis Father   . Heart disease Maternal Grandfather   . Emphysema Maternal Grandfather        Cause of death  . Diabetes Maternal Grandfather   . Heart failure Paternal Grandfather        CHF  . Heart disease Paternal Grandfather   . Asthma Brother   . Alcohol abuse Brother   . Emphysema Maternal Grandmother        Objective:    BP 128/78   Pulse (!) 118   Temp 98 F (36.7 C) (Oral)   Resp 16   Ht 5' 4.96" (1.65 m)   Wt 279 lb (126.6 kg)   LMP 06/24/2015 Comment: per patient periods just decided to stop  SpO2 96%   BMI 46.48 kg/m  Physical Exam  Constitutional: She is oriented to person, place, and time. She appears well-developed and well-nourished. No distress.  HENT:    Head: Normocephalic and atraumatic.  Right Ear: External ear normal.  Left Ear: External ear normal.  Nose: Nose normal.  Mouth/Throat: Oropharynx is clear and moist.  Eyes: Conjunctivae and EOM are normal. Pupils are equal, round, and reactive to light.  Neck: Normal range of motion. Neck supple. Carotid bruit is not present. No thyromegaly present.  Cardiovascular: Normal rate, regular rhythm, normal heart sounds and intact distal pulses. Exam reveals no gallop and no friction rub.  No murmur heard. Pulmonary/Chest: Effort normal and breath sounds normal. She has no wheezes. She has no rales.  Abdominal: Soft. Bowel sounds are normal. She exhibits no distension and no mass. There is no tenderness. There is no rebound and no guarding.  Lymphadenopathy:    She has no cervical adenopathy.  Neurological: She is alert and oriented to person, place, and time. No cranial nerve deficit.  Skin: Skin is warm and dry. No rash noted. She is not diaphoretic. No erythema. No pallor.  Psychiatric: She has a normal mood and affect. Her behavior is normal.   No results found. Depression screen [facility] 2/9 10/30/2017 08/31/2017 07/08/2017 06/09/2017 05/19/2017  Decreased Interest 0 0 0 0 0  Down, Depressed, Hopeless 0 0 0 0 0  PHQ - 2 Score 0 0 0 0 0   Fall Risk  10/30/2017 08/31/2017 07/08/2017 06/09/2017 05/19/2017  Falls in the past year? No No No No No  Number falls in past yr: - - - - -  Injury with Fall? - - - - -  Comment - - - - -         Assessment & Plan:   1. Diabetic peripheral neuropathy (HCC)   2. Hypothyroidism due to acquired atrophy of thyroid   3. Essential hypertension, benign   4. Pure hypercholesterolemia   5. Hypersomnolence   6. Seasonal allergic rhinitis  due to pollen   7. Gastroesophageal reflux disease without esophagitis     Worsening DMII control; encourage improved compliance with exercise and weight loss and dietary modification. Increase Basaglar to 64 units daily;  incrrease Metformin to 1000mg  every evening. New onset hypersomnolence; refer for sleep study; highly suggestive of OSA.    Orders Placed This Encounter  Procedures  . CBC with Differential/Platelet  . Comprehensive metabolic panel    Order Specific Question:   Has the patient fasted?    Answer:   No  . Lipid panel    Order Specific Question:   Has the patient fasted?    Answer:   No  . Ambulatory referral to Sleep Studies    Referral Priority:   Routine    Referral Type:   Consultation    Referral Reason:   Specialty Services Required    Number of Visits Requested:   1  . POCT glycosylated hemoglobin (Hb A1C)  . POCT glucose (manual entry)   Meds ordered this encounter  Medications  . Insulin Glargine (BASAGLAR KWIKPEN) 100 UNIT/ML SOPN    Sig: Inject 0.64 mLs (64 Units total) into the skin daily at 10 pm.    Dispense:  30 mL    Refill:  11  . metFORMIN (GLUCOPHAGE) 500 MG tablet    Sig: Take 1 tablet (500 mg total) by mouth 2 (two) times daily with a meal. With food.    Dispense:  180 tablet    Refill:  1  . DULoxetine (CYMBALTA) 60 MG capsule    Sig: Take 1 capsule (60 mg total) by mouth 2 (two) times daily.    Dispense:  180 capsule    Refill:  3  . empagliflozin (JARDIANCE) 25 MG TABS tablet    Sig: Take 25 mg by mouth daily.    Dispense:  90 tablet    Refill:  3  . levothyroxine (SYNTHROID, LEVOTHROID) 100 MCG tablet    Sig: Take 1 tablet (100 mcg total) by mouth daily.    Dispense:  90 tablet    Refill:  3  . lansoprazole (PREVACID) 30 MG capsule    Sig: Take 1 capsule (30 mg total) by mouth daily.    Dispense:  90 capsule    Refill:  3  . Insulin Pen Needle (PEN NEEDLES) 32G X 4 MM MISC    Sig: Inject 1 each into the skin daily. (DX: E11.9)    Dispense:  100 each    Refill:  4    Return in about 3 months (around 01/28/2018) for complete physical examiniation.   Judi Jaffe Paulita Fujita, M.D. Primary Care at [facility] previously [facility] [address] [place], [place]  [address] [phone] phone [phone] fax

## 2017-10-30 NOTE — Patient Instructions (Addendum)
   Increase Basaglar to 64 units daily. Increase Metformin to 500mg  two at bedtime.   IF you received an x-ray today, you will receive an invoice from [facility]. Please contact [facility] at [phone] with questions or concerns regarding your invoice.   IF you received labwork today, you will receive an invoice from [facility]. Please contact [facility] at [phone] with questions or concerns regarding your invoice.   Our billing staff will not be able to assist you with questions regarding bills from these companies.  You will be contacted with the lab results as soon as they are available. The fastest way to get your results is to activate your My Chart account. Instructions are located on the last page of this paperwork. If you have not heard from us regarding the results in 2 weeks, please contact this office.

## 2017-10-31 LAB — LIPID PANEL
CHOLESTEROL TOTAL: 152 mg/dL (ref 100–199)
Chol/HDL Ratio: 3.5 ratio (ref 0.0–4.4)
HDL: 44 mg/dL (ref >39)
LDL CALC: 67 mg/dL (ref 0–99)
TRIGLYCERIDES: 203 mg/dL — AB (ref 0–149)
VLDL CHOLESTEROL CAL: 41 mg/dL — AB (ref 5–40)

## 2017-10-31 LAB — CBC WITH DIFFERENTIAL/PLATELET
BASOS ABS: 0 10*3/uL (ref 0.0–0.2)
Basos: 0 %
EOS (ABSOLUTE): 0.3 10*3/uL (ref 0.0–0.4)
Eos: 3 %
HEMOGLOBIN: 11.1 g/dL (ref 11.1–15.9)
Hematocrit: 36.3 % (ref 34.0–46.6)
Immature Grans (Abs): 0 10*3/uL (ref 0.0–0.1)
Immature Granulocytes: 0 %
LYMPHS ABS: 2.5 10*3/uL (ref 0.7–3.1)
LYMPHS: 22 %
MCH: 21.1 pg — ABNORMAL LOW (ref 26.6–33.0)
MCHC: 30.6 g/dL — AB (ref 31.5–35.7)
MCV: 69 fL — ABNORMAL LOW (ref 79–97)
MONOCYTES: 8 %
Monocytes Absolute: 0.9 10*3/uL (ref 0.1–0.9)
Neutrophils Absolute: 7.6 10*3/uL — ABNORMAL HIGH (ref 1.4–7.0)
Neutrophils: 67 %
PLATELETS: 423 10*3/uL — AB (ref 150–379)
RBC: 5.27 x10E6/uL (ref 3.77–5.28)
RDW: 17.5 % — ABNORMAL HIGH (ref 12.3–15.4)
WBC: 11.2 10*3/uL — AB (ref 3.4–10.8)

## 2017-10-31 LAB — COMPREHENSIVE METABOLIC PANEL
ALBUMIN: 4.1 g/dL (ref 3.5–5.5)
ALK PHOS: 89 IU/L (ref 39–117)
ALT: 14 IU/L (ref 0–32)
AST: 12 IU/L (ref 0–40)
Albumin/Globulin Ratio: 1.5 (ref 1.2–2.2)
BUN / CREAT RATIO: 30 — AB (ref 9–23)
BUN: 30 mg/dL — AB (ref 6–24)
Bilirubin Total: 0.2 mg/dL (ref 0.0–1.2)
CO2: 21 mmol/L (ref 20–29)
CREATININE: 1 mg/dL (ref 0.57–1.00)
Calcium: 9.5 mg/dL (ref 8.7–10.2)
Chloride: 105 mmol/L (ref 96–106)
GFR calc Af Amer: 75 mL/min/{1.73_m2} (ref >59)
GFR calc non Af Amer: 65 mL/min/{1.73_m2} (ref >59)
GLUCOSE: 145 mg/dL — AB (ref 65–99)
Globulin, Total: 2.7 g/dL (ref 1.5–4.5)
Potassium: 4.5 mmol/L (ref 3.5–5.2)
Sodium: 142 mmol/L (ref 134–144)
Total Protein: 6.8 g/dL (ref 6.0–8.5)

## 2017-11-03 ENCOUNTER — Other Ambulatory Visit: Payer: Self-pay | Admitting: Family Medicine

## 2017-11-30 ENCOUNTER — Other Ambulatory Visit: Payer: Self-pay | Admitting: Family Medicine

## 2017-12-22 ENCOUNTER — Other Ambulatory Visit: Payer: Self-pay | Admitting: Family Medicine

## 2017-12-22 NOTE — Telephone Encounter (Signed)
Celebrex refill request Last OV 10/30/17 but looks like this was discontinued but wasn't sure. Pharmacy [facility], [place]  [address]

## 2017-12-29 ENCOUNTER — Telehealth: Payer: Self-pay

## 2017-12-29 NOTE — Telephone Encounter (Signed)
L/m for pt.  Have received request for refill of Celebrex but this med was indicated as taken. Would like to clarify why being requested and/or if pt needs to be seen.

## 2017-12-30 ENCOUNTER — Other Ambulatory Visit: Payer: Self-pay | Admitting: Family Medicine

## 2017-12-30 DIAGNOSIS — Z1231 Encounter for screening mammogram for malignant neoplasm of breast: Secondary | ICD-10-CM

## 2018-01-03 ENCOUNTER — Encounter: Payer: Self-pay | Admitting: Family Medicine

## 2018-01-04 ENCOUNTER — Other Ambulatory Visit: Payer: Self-pay | Admitting: Family Medicine

## 2018-01-04 NOTE — Telephone Encounter (Signed)
Celecoxib discontinued at 10/30/17 visit with Dr. Katrinka BlazingSmith. Cannot see supporting documentation for discontinuing medication, please advise.

## 2018-01-05 ENCOUNTER — Other Ambulatory Visit: Payer: Self-pay | Admitting: Family Medicine

## 2018-01-05 MED ORDER — CELECOXIB 200 MG PO CAPS: 200.0000 mg | ORAL_CAPSULE | Freq: Every day | ORAL | 0 refills | Status: DC | PRN

## 2018-01-19 ENCOUNTER — Ambulatory Visit
Admission: RE | Admit: 2018-01-19 | Discharge: 2018-01-19 | Disposition: A | Discharge status: Home / Self Care | Payer: BC Managed Care – PPO | Source: Ambulatory Visit | Attending: Family Medicine | Admitting: Family Medicine

## 2018-01-19 DIAGNOSIS — Z1231 Encounter for screening mammogram for malignant neoplasm of breast: Secondary | ICD-10-CM

## 2018-01-29 ENCOUNTER — Other Ambulatory Visit: Payer: Self-pay | Admitting: Family Medicine

## 2018-02-01 ENCOUNTER — Ambulatory Visit (INDEPENDENT_AMBULATORY_CARE_PROVIDER_SITE_OTHER): Payer: BC Managed Care – PPO | Admitting: Family Medicine

## 2018-02-01 ENCOUNTER — Other Ambulatory Visit: Payer: Self-pay

## 2018-02-01 ENCOUNTER — Encounter: Payer: Self-pay | Admitting: Family Medicine

## 2018-02-01 VITALS — BP 110/82 | HR 122 | Temp 97.8°F | Resp 16 | Ht 64.57 in | Wt 273.4 lb

## 2018-02-01 DIAGNOSIS — Z794 Long term (current) use of insulin: Secondary | ICD-10-CM | POA: Diagnosis not present

## 2018-02-01 DIAGNOSIS — I10 Essential (primary) hypertension: Secondary | ICD-10-CM | POA: Diagnosis not present

## 2018-02-01 DIAGNOSIS — F329 Major depressive disorder, single episode, unspecified: Secondary | ICD-10-CM

## 2018-02-01 DIAGNOSIS — F419 Anxiety disorder, unspecified: Secondary | ICD-10-CM | POA: Diagnosis not present

## 2018-02-01 DIAGNOSIS — M3130 Wegener's granulomatosis without renal involvement: Secondary | ICD-10-CM | POA: Diagnosis not present

## 2018-02-01 DIAGNOSIS — E559 Vitamin D deficiency, unspecified: Secondary | ICD-10-CM | POA: Diagnosis not present

## 2018-02-01 DIAGNOSIS — E034 Atrophy of thyroid (acquired): Secondary | ICD-10-CM

## 2018-02-01 DIAGNOSIS — N912 Amenorrhea, unspecified: Secondary | ICD-10-CM

## 2018-02-01 DIAGNOSIS — E7800 Pure hypercholesterolemia, unspecified: Secondary | ICD-10-CM | POA: Diagnosis not present

## 2018-02-01 DIAGNOSIS — E539 Vitamin B deficiency, unspecified: Secondary | ICD-10-CM

## 2018-02-01 DIAGNOSIS — E1142 Type 2 diabetes mellitus with diabetic polyneuropathy: Secondary | ICD-10-CM

## 2018-02-01 DIAGNOSIS — Z124 Encounter for screening for malignant neoplasm of cervix: Secondary | ICD-10-CM

## 2018-02-01 DIAGNOSIS — Z0000 Encounter for general adult medical examination without abnormal findings: Secondary | ICD-10-CM | POA: Diagnosis not present

## 2018-02-01 LAB — GLUCOSE, POCT (MANUAL RESULT ENTRY): POC GLUCOSE: 189 mg/dL — AB (ref 70–99)

## 2018-02-01 LAB — POCT URINALYSIS DIP (MANUAL ENTRY)
Bilirubin, UA: NEGATIVE
Blood, UA: NEGATIVE
Glucose, UA: >=1000 mg/dL — AB
Ketones, POC UA: NEGATIVE mg/dL
Leukocytes, UA: NEGATIVE
Nitrite, UA: NEGATIVE
Protein Ur, POC: NEGATIVE mg/dL
Spec Grav, UA: <=1.005 — AB
Urobilinogen, UA: 0.2 U/dL
pH, UA: 5

## 2018-02-01 LAB — POCT GLYCOSYLATED HEMOGLOBIN (HGB A1C): HEMOGLOBIN A1C: 9.1

## 2018-02-01 NOTE — Patient Instructions (Addendum)
IF you received an x-ray today, you will receive an invoice from [facility]. Please contact [facility] at [phone] with questions or concerns regarding your invoice.   IF you received labwork today, you will receive an invoice from [facility]. Please contact [facility] at [phone] with questions or concerns regarding your invoice.   Our billing staff will not be able to assist you with questions regarding bills from these companies.  You will be contacted with the lab results as soon as they are available. The fastest way to get your results is to activate your My Chart account. Instructions are located on the last page of this paperwork. If you have not heard from [place] regarding the results in 2 weeks, please contact this office.      Preventive Care 40-64 Years, Female Preventive care refers to lifestyle choices and visits with your health care provider that can promote health and wellness. What does preventive care include?  A yearly physical exam. This is also called an annual well check.  Dental exams once or twice a year.  Routine eye exams. Ask your health care provider how often you should have your eyes checked.  Personal lifestyle choices, including: ? Daily care of your teeth and gums. ? Regular physical activity. ? Eating a healthy diet. ? Avoiding tobacco and drug use. ? Limiting alcohol use. ? Practicing safe sex. ? Taking low-dose aspirin daily starting at age 34. ? Taking vitamin and mineral supplements as recommended by your health care provider. What happens during an annual well check? The services and screenings done by your health care provider during your annual well check will depend on your age, overall health, lifestyle risk factors, and family history of disease. Counseling Your health care provider may ask you questions about your:  Alcohol use.  Tobacco use.  Drug use.  Emotional well-being.  Home and relationship  well-being.  Sexual activity.  Eating habits.  Work and work Statistician.  Method of birth control.  Menstrual cycle.  Pregnancy history.  Screening You may have the following tests or measurements:  Height, weight, and BMI.  Blood pressure.  Lipid and cholesterol levels. These may be checked every 5 years, or more frequently if you are over 65 years old.  Skin check.  Lung cancer screening. You may have this screening every year starting at age 102 if you have a 30-pack-year history of smoking and currently smoke or have quit within the past 15 years.  Fecal occult blood test (FOBT) of the stool. You may have this test every year starting at age 33.  Flexible sigmoidoscopy or colonoscopy. You may have a sigmoidoscopy every 5 years or a colonoscopy every 10 years starting at age 23.  Hepatitis C blood test.  Hepatitis B blood test.  Sexually transmitted disease (STD) testing.  Diabetes screening. This is done by checking your blood sugar (glucose) after you have not eaten for a while (fasting). You may have this done every 1-3 years.  Mammogram. This may be done every 1-2 years. Talk to your health care provider about when you should start having regular mammograms. This may depend on whether you have a family history of breast cancer.  BRCA-related cancer screening. This may be done if you have a family history of breast, ovarian, tubal, or peritoneal cancers.  Pelvic exam and Pap test. This may be done every 3 years starting at age 3. Starting at age 55, this may be done every 5 years if  you have a Pap test in combination with an HPV test.  Bone density scan. This is done to screen for osteoporosis. You may have this scan if you are at high risk for osteoporosis.  Discuss your test results, treatment options, and if necessary, the need for more tests with your health care provider. Vaccines Your health care provider may recommend certain vaccines, such  as:  Influenza vaccine. This is recommended every year.  Tetanus, diphtheria, and acellular pertussis (Tdap, Td) vaccine. You may need a Td booster every 10 years.  Varicella vaccine. You may need this if you have not been vaccinated.  Zoster vaccine. You may need this after age 77.  Measles, mumps, and rubella (MMR) vaccine. You may need at least one dose of MMR if you were born in 1957 or later. You may also need a second dose.  Pneumococcal 13-valent conjugate (PCV13) vaccine. You may need this if you have certain conditions and were not previously vaccinated.  Pneumococcal polysaccharide (PPSV23) vaccine. You may need one or two doses if you smoke cigarettes or if you have certain conditions.  Meningococcal vaccine. You may need this if you have certain conditions.  Hepatitis A vaccine. You may need this if you have certain conditions or if you travel or work in places where you may be exposed to hepatitis A.  Hepatitis B vaccine. You may need this if you have certain conditions or if you travel or work in places where you may be exposed to hepatitis B.  Haemophilus influenzae type b (Hib) vaccine. You may need this if you have certain conditions.  Talk to your health care provider about which screenings and vaccines you need and how often you need them. This information is not intended to replace advice given to you by your health care provider. Make sure you discuss any questions you have with your health care provider. Document Released: 12/07/2015 Document Revised: 07/30/2016 Document Reviewed: 09/11/2015 Elsevier Interactive Patient Education  [employer].

## 2018-02-01 NOTE — Progress Notes (Signed)
Subjective:    Patient ID: Rachel Walls, female    DOB: 03/24/1966, 52 y.o.   MRN: [id number]  02/01/2018  Annual Exam    HPI This 52 y.o. female presents for Complete Physical Examination.  Last physical:  11-25-2016 Pap smear: 08-10-2015 WNL HPV negative, 11-25-2016 Mammogram:  01-20-2018 Colonoscopy:  11-24-2010 Bone density: none Eye exam:  Not yet; early; only Mr[facility] Dental exam:  Every six months.   Visual Acuity Screening   Right eye Left eye Both eyes  Without correction:     With correction: 20/20 20/20 20/20     BP Readings from Last 3 Encounters:  02/01/18 110/82  10/30/17 128/78  08/31/17 118/70   Wt Readings from Last 3 Encounters:  02/01/18 273 lb 6.4 oz (124 kg)  10/30/17 279 lb (126.6 kg)  08/31/17 281 lb (127.5 kg)   Immunization History  Administered Date(s) Administered  . Hepatitis A, Adult 09/28/2013, 05/29/2014  . Hepatitis B 03/16/2013  . Hepatitis B, adult 06/27/2013, 09/28/2013  . Influenza Split 08/06/2010  . Influenza,inj,Quad PF,6+ Mos 09/28/2013, 08/28/2014, 08/10/2015, 08/16/2016, 08/31/2017  . Pneumococcal Conjugate-13 11/01/2014  . Pneumococcal Polysaccharide-23 11/24/1998, 09/28/2013  . Tdap 01/18/2011   Health Maintenance  Topic Date Due  . OPHTHALMOLOGY EXAM  06/01/2018  . HEMOGLOBIN A1C  08/04/2018  . MAMMOGRAM  01/19/2019  . FOOT EXAM  02/02/2019  . COLONOSCOPY  11/24/2020  . TETANUS/TDAP  01/18/2021  . PAP SMEAR  02/01/2021  . PNEUMOCOCCAL POLYSACCHARIDE VACCINE  Completed  . INFLUENZA VACCINE  Completed  . HIV Screening  Completed   Acute Sinusitis: prescribed Cefdinir and Tessalon Perles on 01/27/18; diagnosed with acute sinusitis; completed Prednsione course; if no improvement after Prednisone, started Cefdinir. Sleep study during spring break.     Review of Systems  Constitutional: Negative for activity change, appetite change, chills, diaphoresis, fatigue, fever and unexpected weight change.  HENT: Positive for  congestion, postnasal drip, rhinorrhea and sinus pain. Negative for dental problem, drooling, ear discharge, ear pain, facial swelling, hearing loss, mouth sores, nosebleeds, sinus pressure, sneezing, sore throat, tinnitus, trouble swallowing and voice change.   Eyes: Negative for photophobia, pain, discharge, redness, itching and visual disturbance.  Respiratory: Negative for apnea, cough, choking, chest tightness, shortness of breath, wheezing and stridor.   Cardiovascular: Negative for chest pain, palpitations and leg swelling.  Gastrointestinal: Negative for abdominal distention, abdominal pain, anal bleeding, blood in stool, constipation, diarrhea, nausea, rectal pain and vomiting.  Endocrine: Negative for cold intolerance, heat intolerance, polydipsia, polyphagia and polyuria.  Genitourinary: Negative for decreased urine volume, difficulty urinating, dyspareunia, dysuria, enuresis, flank pain, frequency, genital sores, hematuria, menstrual problem, pelvic pain, urgency, vaginal bleeding, vaginal discharge and vaginal pain.       Nocturia x 2-3.  Urinary leakage STRESS.  Musculoskeletal: Negative for arthralgias, back pain, gait problem, joint swelling, myalgias, neck pain and neck stiffness.  Skin: Negative for color change, pallor, rash and wound.  Allergic/Immunologic: Negative for environmental allergies, food allergies and immunocompromised state.  Neurological: Negative for dizziness, tremors, seizures, syncope, facial asymmetry, speech difficulty, weakness, light-headedness, numbness and headaches.  Hematological: Negative for adenopathy. Does not bruise/bleed easily.  Psychiatric/Behavioral: Negative for agitation, behavioral problems, confusion, decreased concentration, dysphoric mood, hallucinations, self-injury, sleep disturbance and suicidal ideas. The patient is not nervous/anxious and is not hyperactive.        Bedtime 1000; wakes up 530.      Past Medical History:  Diagnosis  Date  . Allergic rhinitis, cause unspecified   .  Anemia   . Anxiety   . Arthritis   . Asthma   . Chronic kidney disease   . Diabetes mellitus    Type 2  . GERD (gastroesophageal reflux disease)   . Hypertension   . Hypertensive retinopathy   . Hypothyroidism   . Nonspecific elevation of levels of transaminase or lactic acid dehydrogenase (LDH)   . Obesity, unspecified   . Other and unspecified hyperlipidemia   . Psychic factors associated with disease    Psychogenic factors with other diseases  . Unspecified vitamin D deficiency   . Venous engorgement of retina   . Wegener's granulomatosis (HCC)    Past Surgical History:  Procedure Laterality Date  . abdominal ultrasound  05/17/2012   severe hepatomegaly at 22 cm; gallbladder normal.   [facility].  . Calcaneous bone spur surgery     x2  . CT abdomen  06/10/12   Hepatomegaly.  [facility].  Marland Kitchen ESOPHAGOGASTRODUODENOSCOPY  08/19/12   diffuse gastritis antrum; pathology negative for malignancy, H. Pylori; esophagus and duodenum normal.  . hida scan  08/27/12   normal; EF 62%.  [facility].  Marland Kitchen LUNG BIOPSY  1999  . Meniscus tear     L; x 3 tears.  [facility].  Marland Kitchen RENAL BIOPSY  1999  . 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 TONSILLECTOMY     Allergies  Allergen Reactions  . Penicillins Hives  . Sulfonamide Derivatives     Reaction: arthralgias.   Current Outpatient Medications on File Prior to Visit  Medication Sig Dispense Refill  . atorvastatin (LIPITOR) 10 MG tablet Take 1 tablet (10 mg total) by mouth daily. 90 tablet 3  . DULoxetine (CYMBALTA) 60 MG capsule Take 1 capsule (60 mg total) by mouth 2 (two) times daily. 180 capsule 3  . empagliflozin (JARDIANCE) 25 MG TABS tablet Take 25 mg by mouth daily. 90 tablet 3  . fluticasone (FLONASE) 50 MCG/ACT nasal spray Place 2 sprays into both nostrils daily. 16 g 11  . glucose blood (ONE TOUCH ULTRA TEST) test strip Use as instructed--check sugar 3 TIMES A DAY.  100 each 0  . Insulin Glargine (BASAGLAR KWIKPEN) 100 UNIT/ML SOPN Inject 0.64 mLs (64 Units total) into the skin daily at 10 pm. 30 mL 11  . Insulin Pen Needle (PEN NEEDLES) 32G X 4 MM MISC Inject 1 each into the skin daily. (DX: E11.9) 100 each 4  . lansoprazole (PREVACID) 30 MG capsule Take 1 capsule (30 mg total) by mouth daily. 90 capsule 3  . levothyroxine (SYNTHROID, LEVOTHROID) 100 MCG tablet Take 1 tablet (100 mcg total) by mouth daily. 90 tablet 3  . metFORMIN (GLUCOPHAGE) 500 MG tablet Take 1 tablet (500 mg total) by mouth 2 (two) times daily with a meal. With food. 180 tablet 1  . mycophenolate (CELLCEPT) 250 MG capsule Take 250 mg by mouth at bedtime. 2 tab in the morning & 1 tab at night.    . Vitamin D, Ergocalciferol, (DRISDOL) 50000 units CAPS capsule Take 1 capsule (50,000 Units total) by mouth every 7 (seven) days. 12 capsule 0  . albuterol (PROVENTIL HFA;VENTOLIN HFA) 108 (90 BASE) MCG/ACT inhaler Inhale 90 mcg into the lungs every 4 (four) hours as needed.     No current facility-administered medications on file prior to visit.    Social History   Socioeconomic History  . Marital status: Married    Spouse name: Renella Cunas  . Number of children: 0  .  Years of education: college  . Highest education level: Not on file  Occupational History  . Occupation: Magazine features editorTEACHER    Employer: [employer]    Comment: Full time  Social Needs  . Financial resource strain: Not on file  . Food insecurity:    Worry: Not on file    Inability: Not on file  . Transportation needs:    Medical: Not on file    Non-medical: Not on file  Tobacco Use  . Smoking status: Never Smoker  . Smokeless tobacco: Never Used  . Tobacco comment: Tobacco use-no  Substance and Sexual Activity  . Alcohol use: No  . Drug use: No  . Sexual activity: Yes    Partners: Male    Birth control/protection: Post-menopausal  Lifestyle  . Physical activity:    Days per week: Not on file    Minutes per session: Not  on file  . Stress: Not on file  Relationships  . Social connections:    Talks on phone: Not on file    Gets together: Not on file    Attends religious service: Not on file    Active member of club or organization: Not on file    Attends meetings of clubs or organizations: Not on file    Relationship status: Not on file  . Intimate partner violence:    Fear of current or ex partner: Not on file    Emotionally abused: Not on file    Physically abused: Not on file    Forced sexual activity: Not on file  Other Topics Concern  . Not on file  Social History Narrative   Marital status:  Married x 27 years;happily married; no abuse.      Children: none      Lives: with husband, 3 cats, 1 dog.      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      Tobacco: never      Alcohol: never      Drugs: none      Exercise: walking in 2019.      Seatbelt: 100%; no texting while driving.      Guns: none          Family History  Problem Relation Age of Onset  . Hyperlipidemia Mother        Hypercholesterolemia  . Arthritis Mother        OA  . Sarcoidosis Father   . Heart disease Maternal Grandfather   . Emphysema Maternal Grandfather        Cause of death  . Diabetes Maternal Grandfather   . Heart failure Paternal Grandfather        CHF  . Heart disease Paternal Grandfather   . Asthma Brother   . Alcohol abuse Brother   . Emphysema Maternal Grandmother   . Breast cancer Neg Hx        Objective:    BP 110/82 (BP Location: Left Arm, Patient Position: Sitting, Cuff Size: Large)   Pulse (!) 122   Temp 97.8 F (36.6 C) (Oral)   Resp 16   Ht 5' 4.57" (1.64 m)   Wt 273 lb 6.4 oz (124 kg)   LMP 06/24/2015 Comment: per patient periods just decided to stop  SpO2 98%   BMI 46.11 kg/m  Physical Exam  Constitutional: She is oriented to person, place, and time. She appears well-developed and well-nourished. No distress.  HENT:  Head:  Normocephalic and  atraumatic.  Right Ear: Hearing, tympanic membrane, external ear and ear canal normal.  Left Ear: Hearing, tympanic membrane, external ear and ear canal normal.  Nose: Nose normal.  Mouth/Throat: Oropharynx is clear and moist.  Eyes: Conjunctivae and EOM are normal. Pupils are equal, round, and reactive to light.  Neck: Normal range of motion and full passive range of motion without pain. Neck supple. No JVD present. Carotid bruit is not present. No thyromegaly present.  Cardiovascular: Normal rate, regular rhythm, normal heart sounds and intact distal pulses. Exam reveals no gallop and no friction rub.  No murmur heard. Pulmonary/Chest: Effort normal and breath sounds normal. No respiratory distress. She has no wheezes. She has no rales. Right breast exhibits no inverted nipple, no mass, no nipple discharge, no skin change and no tenderness. Left breast exhibits no inverted nipple, no mass, no nipple discharge, no skin change and no tenderness. Breasts are symmetrical.  Abdominal: Soft. Bowel sounds are normal. She exhibits no distension and no mass. There is no tenderness. There is no rebound and no guarding.  Genitourinary: Vagina normal and uterus normal. There is no rash, tenderness or lesion on the right labia. There is no rash, tenderness or lesion on the left labia. Right adnexum displays no mass, no tenderness and no fullness. Left adnexum displays no mass, no tenderness and no fullness.  Musculoskeletal:       Right shoulder: Normal.       Left shoulder: Normal.       Cervical back: Normal.  Lymphadenopathy:    She has no cervical adenopathy.  Neurological: She is alert and oriented to person, place, and time. She has normal reflexes. No cranial nerve deficit. She exhibits normal muscle tone. Coordination normal.  Skin: Skin is warm and dry. No rash noted. She is not diaphoretic. No erythema. No pallor.  Psychiatric: She has a normal mood and affect. Her behavior is  normal. Judgment and thought content normal.  Nursing note and vitals reviewed.  No results found. Depression screen [facility] 2/9 02/01/2018 10/30/2017 08/31/2017 07/08/2017 06/09/2017  Decreased Interest 0 0 0 0 0  Down, Depressed, Hopeless 0 0 0 0 0  PHQ - 2 Score 0 0 0 0 0   Fall Risk  02/01/2018 10/30/2017 08/31/2017 07/08/2017 06/09/2017  Falls in the past year? No No No No No  Number falls in past yr: - - - - -  Injury with Fall? - - - - -  Comment - - - - -        Assessment & Plan:   1. Routine physical examination   2. Essential hypertension, benign   3. Wegener's granulomatosis (HCC)   4. Type 2 diabetes mellitus with diabetic polyneuropathy, with long-term current use of insulin (HCC)   5. Hypothyroidism due to acquired atrophy of thyroid   6. Diabetic peripheral neuropathy (HCC)   7. Pure hypercholesterolemia   8. Vitamin D deficiency   9. Vitamin B deficiency   10. Anxiety and depression   11. Amenorrhea   12. Cervical cancer screening   13. Morbid obesity (HCC)     -anticipatory guidance provided --- exercise, weight loss, safe driving practices, aspirin 81mg  daily. -obtain age appropriate screening labs and labs for chronic disease management. -pap smear obtained. -recommend weight loss, exercise for 30-60 minutes five days per week; recommend 1200 kcal restriction per day with a minimum of 60 grams of protein per day.   Orders Placed This Encounter  Procedures  . CBC with  Differential/Platelet  . Comprehensive metabolic panel    Order Specific Question:   Has the patient fasted?    Answer:   No  . Lipid panel    Order Specific Question:   Has the patient fasted?    Answer:   No  . TSH  . FSH/LH  . Microalbumin / creatinine urine ratio  . POCT urinalysis dipstick  . POCT glucose (manual entry)  . POCT glycosylated hemoglobin (Hb A1C)  . EKG 12-Lead   Meds ordered this encounter  Medications  . gabapentin (NEURONTIN) 800 MG tablet    Sig: Take 1 tablet (800  mg total) by mouth 3 (three) times daily.    Dispense:  270 tablet    Refill:  1  . ramipril (ALTACE) 5 MG capsule    Sig: Take 1 capsule (5 mg total) by mouth daily.    Dispense:  90 capsule    Refill:  3    Return in about 3 months (around 05/04/2018) for follow-up chronic medical conditions.   Marisah Laker Paulita Fujita, M.D. Primary Care at [facility] previously [facility] [address] [place], [place]  [address] [phone] phone [phone] fax

## 2018-02-02 ENCOUNTER — Other Ambulatory Visit: Payer: Self-pay | Admitting: Family Medicine

## 2018-02-02 LAB — CBC WITH DIFFERENTIAL/PLATELET
BASOS: 0 %
Basophils Absolute: 0.1 10*3/uL (ref 0.0–0.2)
EOS (ABSOLUTE): 0.2 10*3/uL (ref 0.0–0.4)
EOS: 1 %
HEMATOCRIT: 39.6 % (ref 34.0–46.6)
HEMOGLOBIN: 12.3 g/dL (ref 11.1–15.9)
IMMATURE GRANS (ABS): 0.1 10*3/uL (ref 0.0–0.1)
IMMATURE GRANULOCYTES: 1 %
Lymphocytes Absolute: 3.9 10*3/uL — ABNORMAL HIGH (ref 0.7–3.1)
Lymphs: 24 %
MCH: 21.6 pg — ABNORMAL LOW (ref 26.6–33.0)
MCHC: 31.1 g/dL — ABNORMAL LOW (ref 31.5–35.7)
MCV: 70 fL — AB (ref 79–97)
MONOCYTES: 8 %
MONOS ABS: 1.4 10*3/uL — AB (ref 0.1–0.9)
NEUTROS PCT: 66 %
Neutrophils Absolute: 10.8 10*3/uL — ABNORMAL HIGH (ref 1.4–7.0)
Platelets: 499 10*3/uL — ABNORMAL HIGH (ref 150–379)
RBC: 5.7 x10E6/uL — ABNORMAL HIGH (ref 3.77–5.28)
RDW: 17.3 % — AB (ref 12.3–15.4)
WBC: 16.3 10*3/uL — ABNORMAL HIGH (ref 3.4–10.8)

## 2018-02-02 LAB — FSH/LH
FSH: 32.3 m[IU]/mL
LH: 31.5 m[IU]/mL

## 2018-02-02 LAB — COMPREHENSIVE METABOLIC PANEL
A/G RATIO: 1.5 (ref 1.2–2.2)
ALBUMIN: 4.4 g/dL (ref 3.5–5.5)
ALT: 12 IU/L (ref 0–32)
AST: 9 IU/L (ref 0–40)
Alkaline Phosphatase: 108 IU/L (ref 39–117)
BUN / CREAT RATIO: 26 — AB (ref 9–23)
BUN: 34 mg/dL — ABNORMAL HIGH (ref 6–24)
Bilirubin Total: <0.2 mg/dL (ref 0.0–1.2)
CALCIUM: 9.9 mg/dL (ref 8.7–10.2)
CO2: 26 mmol/L (ref 20–29)
Chloride: 96 mmol/L (ref 96–106)
Creatinine, Ser: 1.3 mg/dL — ABNORMAL HIGH (ref 0.57–1.00)
GFR calc Af Amer: 55 mL/min/{1.73_m2} — ABNORMAL LOW (ref >59)
GFR, EST NON AFRICAN AMERICAN: 47 mL/min/{1.73_m2} — AB (ref >59)
GLOBULIN, TOTAL: 3 g/dL (ref 1.5–4.5)
Glucose: 204 mg/dL — ABNORMAL HIGH (ref 65–99)
POTASSIUM: 4.6 mmol/L (ref 3.5–5.2)
SODIUM: 140 mmol/L (ref 134–144)
TOTAL PROTEIN: 7.4 g/dL (ref 6.0–8.5)

## 2018-02-02 LAB — LIPID PANEL
CHOL/HDL RATIO: 3 ratio (ref 0.0–4.4)
Cholesterol, Total: 197 mg/dL (ref 100–199)
HDL: 66 mg/dL (ref >39)
LDL Calculated: 85 mg/dL (ref 0–99)
Triglycerides: 232 mg/dL — ABNORMAL HIGH (ref 0–149)
VLDL Cholesterol Cal: 46 mg/dL — ABNORMAL HIGH (ref 5–40)

## 2018-02-02 LAB — MICROALBUMIN / CREATININE URINE RATIO
Creatinine, Urine: 43.9 mg/dL
MICROALB/CREAT RATIO: 9.8 mg/g{creat} (ref 0.0–30.0)
MICROALBUM., U, RANDOM: 4.3 ug/mL

## 2018-02-02 LAB — TSH: TSH: 2.03 u[IU]/mL (ref 0.450–4.500)

## 2018-02-02 NOTE — Telephone Encounter (Signed)
LOV: 02/01/18  Dr. Luz BrazenSmith  [facility]

## 2018-02-03 LAB — PAP IG W/ RFLX HPV ASCU: PAP SMEAR COMMENT: 0

## 2018-02-05 ENCOUNTER — Other Ambulatory Visit: Payer: Self-pay | Admitting: Family Medicine

## 2018-02-05 ENCOUNTER — Other Ambulatory Visit: Payer: Self-pay

## 2018-02-05 MED ORDER — CELECOXIB 200 MG PO CAPS: 200.0000 mg | ORAL_CAPSULE | Freq: Every day | ORAL | 0 refills | Status: DC

## 2018-02-05 NOTE — Telephone Encounter (Signed)
Sent Rx to pharmacy  

## 2018-02-05 NOTE — Telephone Encounter (Signed)
Rx refill request: see E mail for more information- not sure if this is long term:  Celebrex 200 mg  LOV: 02/01/18  PCP: Katrinka BlazingSmith  Pharmacy: verified

## 2018-02-21 MED ORDER — GABAPENTIN 800 MG PO TABS: 800.0000 mg | ORAL_TABLET | Freq: Three times a day (TID) | ORAL | 1 refills | Status: DC

## 2018-02-21 MED ORDER — RAMIPRIL 5 MG PO CAPS: 5.0000 mg | ORAL_CAPSULE | Freq: Every day | ORAL | 3 refills | Status: AC

## 2018-02-24 ENCOUNTER — Other Ambulatory Visit: Payer: Self-pay

## 2018-02-24 ENCOUNTER — Emergency Department: Payer: No Typology Code available for payment source

## 2018-02-24 ENCOUNTER — Encounter: Payer: Self-pay | Admitting: Emergency Medicine

## 2018-02-24 ENCOUNTER — Emergency Department
Admission: EM | Admit: 2018-02-24 | Discharge: 2018-02-24 | Disposition: A | Discharge status: Home / Self Care | Payer: No Typology Code available for payment source | Attending: Emergency Medicine | Admitting: Emergency Medicine

## 2018-02-24 DIAGNOSIS — Y999 Unspecified external cause status: Secondary | ICD-10-CM | POA: Insufficient documentation

## 2018-02-24 DIAGNOSIS — E039 Hypothyroidism, unspecified: Secondary | ICD-10-CM | POA: Diagnosis not present

## 2018-02-24 DIAGNOSIS — Z7984 Long term (current) use of oral hypoglycemic drugs: Secondary | ICD-10-CM | POA: Diagnosis not present

## 2018-02-24 DIAGNOSIS — I129 Hypertensive chronic kidney disease with stage 1 through stage 4 chronic kidney disease, or unspecified chronic kidney disease: Secondary | ICD-10-CM | POA: Diagnosis not present

## 2018-02-24 DIAGNOSIS — E1122 Type 2 diabetes mellitus with diabetic chronic kidney disease: Secondary | ICD-10-CM | POA: Insufficient documentation

## 2018-02-24 DIAGNOSIS — Y939 Activity, unspecified: Secondary | ICD-10-CM | POA: Insufficient documentation

## 2018-02-24 DIAGNOSIS — Y929 Unspecified place or not applicable: Secondary | ICD-10-CM | POA: Insufficient documentation

## 2018-02-24 DIAGNOSIS — M25571 Pain in right ankle and joints of right foot: Secondary | ICD-10-CM | POA: Insufficient documentation

## 2018-02-24 DIAGNOSIS — Z794 Long term (current) use of insulin: Secondary | ICD-10-CM | POA: Diagnosis not present

## 2018-02-24 DIAGNOSIS — N189 Chronic kidney disease, unspecified: Secondary | ICD-10-CM | POA: Diagnosis not present

## 2018-02-24 DIAGNOSIS — W108XXA Fall (on) (from) other stairs and steps, initial encounter: Secondary | ICD-10-CM | POA: Insufficient documentation

## 2018-02-24 DIAGNOSIS — J45909 Unspecified asthma, uncomplicated: Secondary | ICD-10-CM | POA: Insufficient documentation

## 2018-02-24 DIAGNOSIS — W19XXXA Unspecified fall, initial encounter: Secondary | ICD-10-CM

## 2018-02-24 DIAGNOSIS — M25561 Pain in right knee: Secondary | ICD-10-CM | POA: Diagnosis present

## 2018-02-24 MED ORDER — IBUPROFEN 800 MG PO TABS: 800.0000 mg | ORAL_TABLET | Freq: Three times a day (TID) | ORAL | 0 refills | Status: DC | PRN

## 2018-02-24 MED ORDER — OXYCODONE-ACETAMINOPHEN 5-325 MG PO TABS
1.0000 | ORAL_TABLET | Freq: Once | ORAL | Status: AC
  Administered 2018-02-24: 1 via ORAL
  Filled 2018-02-24: qty 1

## 2018-02-24 NOTE — ED Notes (Addendum)
Spoke with Rachel Walls at [employer] [phone] regarding WC due to profile saying only upon request. She stated that no UDS was needed.

## 2018-02-24 NOTE — ED Notes (Signed)
Patient stated that she doesn't need the crutches because she has crutches at home.

## 2018-02-24 NOTE — ED Notes (Signed)
Pt states she fell down three steps hitting right knee. Pt also states right ankle is hurting on the anterior side. Pt maintains full ROM of the rt knee and ankle

## 2018-02-24 NOTE — ED Triage Notes (Signed)
Arrives via [facility].  Slipped and fell down a couple of steps.  C/O right knee pain.  AAOx3. Skin warm and dry. NAD

## 2018-02-24 NOTE — ED Provider Notes (Signed)
[facility] Emergency Department Provider Note  ____________________________________________  Time seen: Approximately 12:02 PM  I have reviewed the triage vital signs and the nursing notes.   HISTORY  Chief Complaint Fall and Knee Pain    HPI Rachel Walls is a 52 y.o. female that presents to the emergency department for right knee pain after falling down 3 steps. She thought she was at the bottom step when she fell. She has not tried to walk since fall.  She is currently having pain over her knee and ankle.  She is able to move them but with pain.  She did not hit her head or lose consciousness. No additional injuries.    Past Medical History:  Diagnosis Date  . Allergic rhinitis, cause unspecified   . Anemia   . Anxiety   . Arthritis   . Asthma   . Chronic kidney disease   . Diabetes mellitus    Type 2  . GERD (gastroesophageal reflux disease)   . Hypertension   . Hypertensive retinopathy   . Hypothyroidism   . Nonspecific elevation of levels of transaminase or lactic acid dehydrogenase (LDH)   . Obesity, unspecified   . Other and unspecified hyperlipidemia   . Psychic factors associated with disease    Psychogenic factors with other diseases  . Unspecified vitamin D deficiency   . Venous engorgement of retina   . Wegener's granulomatosis [facility])     Patient Active Problem List   Diagnosis Date Noted  . Morbid obesity (HCC) 11/25/2016  . Diabetic peripheral neuropathy (HCC) 01/23/2013  . Anxiety and depression 01/23/2013  . Type 2 diabetes mellitus with diabetic polyneuropathy, with long-term current use of insulin (HCC) 09/13/2012  . Essential hypertension, benign 09/13/2012  . Pure hypercholesterolemia 09/13/2012  . Hypothyroidism 09/13/2012  . Hepatomegaly 09/13/2012  . Allergic rhinitis 09/13/2012  . Wegener's granulomatosis (HCC) 09/13/2012  . Vitamin D deficiency 09/13/2012  . Low iron 09/13/2012  . Vitamin B deficiency  09/13/2012    Past Surgical History:  Procedure Laterality Date  . abdominal ultrasound  05/17/2012   severe hepatomegaly at 22 cm; gallbladder normal.   [facility].  . Calcaneous bone spur surgery     x2  . CT abdomen  06/10/12   Hepatomegaly.  [facility].  Marland Kitchen ESOPHAGOGASTRODUODENOSCOPY  08/19/12   diffuse gastritis antrum; pathology negative for malignancy, H. Pylori; esophagus and duodenum normal.  . hida scan  08/27/12   normal; EF 62%.  [facility].  Marland Kitchen LUNG BIOPSY  1999  . Meniscus tear     L; x 3 tears.  [facility].  Marland Kitchen RENAL BIOPSY  1999  . 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 TONSILLECTOMY      Prior to Admission medications   Medication Sig Start Date End Date Taking? Authorizing Provider  albuterol (PROVENTIL HFA;VENTOLIN HFA) 108 (90 BASE) MCG/ACT inhaler Inhale 90 mcg into the lungs every 4 (four) hours as needed. 12/23/14 12/23/15  [provider]  atorvastatin (LIPITOR) 10 MG tablet Take 1 tablet (10 mg total) by mouth daily. 07/24/17   Ethelda Chick, MD  celecoxib (CELEBREX) 200 MG capsule Take 1 capsule (200 mg total) by mouth daily. 02/05/18   Ethelda Chick, MD  DULoxetine (CYMBALTA) 60 MG capsule Take 1 capsule (60 mg total) by mouth 2 (two) times daily. 10/30/17   Ethelda Chick, MD  empagliflozin (JARDIANCE) 25 MG TABS tablet Take 25 mg by mouth daily. 10/30/17  Ethelda Chick, MD  fluticasone [facility]) 50 MCG/ACT nasal spray Place 2 sprays into both nostrils daily. 09/16/15   Ethelda Chick, MD  gabapentin (NEURONTIN) 800 MG tablet Take 1 tablet (800 mg total) by mouth 3 (three) times daily. 02/21/18   Ethelda Chick, MD  glucose blood (ONE TOUCH ULTRA TEST) test strip Use as instructed--check sugar 3 TIMES A DAY. 01/29/18   Ethelda Chick, MD  ibuprofen (ADVIL,MOTRIN) 800 MG tablet Take 1 tablet (800 mg total) by mouth every 8 (eight) hours as needed. 02/24/18   Enid Derry, PA-C  Insulin Glargine (BASAGLAR KWIKPEN) 100 UNIT/ML SOPN  Inject 0.64 mLs (64 Units total) into the skin daily at 10 pm. 10/30/17   Ethelda Chick, MD  Insulin Pen Needle (PEN NEEDLES) 32G X 4 MM MISC Inject 1 each into the skin daily. (DX: E11.9) 10/30/17   Ethelda Chick, MD  lansoprazole (PREVACID) 30 MG capsule Take 1 capsule (30 mg total) by mouth daily. 10/30/17   Ethelda Chick, MD  levothyroxine (SYNTHROID, LEVOTHROID) 100 MCG tablet Take 1 tablet (100 mcg total) by mouth daily. 10/30/17   Ethelda Chick, MD  metFORMIN (GLUCOPHAGE) 500 MG tablet Take 1 tablet (500 mg total) by mouth 2 (two) times daily with a meal. With food. 10/30/17   Ethelda Chick, MD  mycophenolate (CELLCEPT) 250 MG capsule Take 250 mg by mouth at bedtime. 2 tab in the morning & 1 tab at night.    [provider]  ramipril (ALTACE) 5 MG capsule Take 1 capsule (5 mg total) by mouth daily. 02/21/18   Ethelda Chick, MD  Vitamin D, Ergocalciferol, (DRISDOL) 50000 units CAPS capsule Take 1 capsule (50,000 Units total) by mouth every 7 (seven) days. 01/05/18   Ethelda Chick, MD    Allergies Penicillins and Sulfonamide derivatives  Family History  Problem Relation Age of Onset  . Hyperlipidemia Mother        Hypercholesterolemia  . Arthritis Mother        OA  . Sarcoidosis Father   . Heart disease Maternal Grandfather   . Emphysema Maternal Grandfather        Cause of death  . Diabetes Maternal Grandfather   . Heart failure Paternal Grandfather        CHF  . Heart disease Paternal Grandfather   . Asthma Brother   . Alcohol abuse Brother   . Emphysema Maternal Grandmother   . Breast cancer Neg Hx     Social History Social History   Tobacco Use  . Smoking status: Never Smoker  . Smokeless tobacco: Never Used  . Tobacco comment: Tobacco use-no  Substance Use Topics  . Alcohol use: No  . Drug use: No     Review of Systems  Cardiovascular: No chest pain. Respiratory: No SOB. Gastrointestinal: No abdominal pain.  No nausea, no vomiting.   Musculoskeletal: Positive for knee and ankle pain.  Skin: Negative for rash, abrasions, lacerations, ecchymosis. Neurological: Negative for headaches, numbness or tingling   ____________________________________________   PHYSICAL EXAM:  VITAL SIGNS: ED Triage Vitals  Enc Vitals Group     BP 02/24/18 1143 (!) 122/93     Pulse Rate 02/24/18 1143 87     Resp 02/24/18 1143 16     Temp 02/24/18 1143 98.5 F (36.9 C)     Temp Source 02/24/18 1143 Oral     SpO2 02/24/18 1143 98 %     Weight 02/24/18 1143 280  lb (127 kg)     Height 02/24/18 1143 5\' 5"  (1.651 m)     Head Circumference --      Peak Flow --      Pain Score 02/24/18 1139 6     Pain Loc --      Pain Edu? --      Excl. in GC? --      Constitutional: Alert and oriented. Well appearing and in no acute distress. Eyes: Conjunctivae are normal. PERRL. EOMI. Head: Atraumatic. ENT:      Ears:      Nose: No congestion/rhinnorhea.      Mouth/Throat: Mucous membranes are moist.  Neck: No stridor.  Cardiovascular: Normal rate, regular rhythm.  Good peripheral circulation. Respiratory: Normal respiratory effort without tachypnea or retractions. Lungs CTAB. Good air entry to the bases with no decreased or absent breath sounds. Musculoskeletal: Full range of motion to all extremities. No gross deformities appreciated.  No visible swelling or erythema.  Full range of motion of knee and ankle but with pain.  Tenderness to palpation over dorsal ankle. Neurologic:  Normal speech and language. No gross focal neurologic deficits are appreciated.  Skin:  Skin is warm, dry and intact. No rash noted. Psychiatric: Mood and affect are normal. Speech and behavior are normal. Patient exhibits appropriate insight and judgement.   ____________________________________________   LABS (all labs ordered are listed, but only abnormal results are displayed)  Labs Reviewed - No data to  display ____________________________________________  EKG   ____________________________________________  RADIOLOGY Lexine Baton, personally viewed and evaluated these images (plain radiographs) as part of my medical decision making, as well as reviewing the written report by the radiologist.  Dg Ankle Complete Right  Result Date: 02/24/2018 CLINICAL DATA:  Fall, pain, initial encounter. EXAM: RIGHT ANKLE - COMPLETE 3+ VIEW COMPARISON:  None. FINDINGS: Soft tissue swelling is seen predominantly laterally. Ankle mortise is intact. No definite fracture. Mild degenerative changes along the anterior aspect of the distal tibia. IMPRESSION: Soft tissue swelling without acute fracture. Electronically Signed   By: Leanna Battles M.D.   On: 02/24/2018 12:18   Dg Knee Complete 4 Views Right  Result Date: 02/24/2018 CLINICAL DATA:  Slip and fall with right knee pain, initial encounter EXAM: RIGHT KNEE - COMPLETE 4+ VIEW COMPARISON:  None. FINDINGS: No evidence of fracture, dislocation, or joint effusion. No evidence of arthropathy or other focal bone abnormality. Soft tissues are unremarkable. IMPRESSION: No acute abnormality noted. Electronically Signed   By: Alcide Clever M.D.   On: 02/24/2018 12:17    ____________________________________________    PROCEDURES  Procedure(s) performed:    Procedures    Medications  oxyCODONE-acetaminophen (PERCOCET/ROXICET) 5-325 MG per tablet 1 tablet (1 tablet Oral Given 02/24/18 1228)     ____________________________________________   INITIAL IMPRESSION / ASSESSMENT AND PLAN / ED COURSE  Pertinent labs & imaging results that were available during my care of the patient were reviewed by me and considered in my medical decision making (see chart for details).  Review of the [place] CSRS was performed in accordance of the [facility] prior to dispensing any controlled drugs.     Patient presented to the emergency room for evaluation of knee and ankle pain  after fall this morning. Vital signs and exam are reassuring. Ankle and knee xray are negative are negative. Ankle was ace wrapped. Patient has crutches at home. Patient will be discharged home with prescriptions for ibuprofen. Patient is to follow up with PCP as directed.  Patient is given ED precautions to return to the ED for any worsening or new symptoms.     ____________________________________________  FINAL CLINICAL IMPRESSION(S) / ED DIAGNOSES  Final diagnoses:  Fall, initial encounter  Acute pain of right knee  Acute right ankle pain      NEW MEDICATIONS STARTED DURING THIS VISIT:  ED Discharge Orders        Ordered    ibuprofen (ADVIL,MOTRIN) 800 MG tablet  Every 8 hours PRN     02/24/18 1257          This chart was dictated using voice recognition software/Dragon. Despite best efforts to proofread, errors can occur which can change the meaning. Any change was purely unintentional.    Enid DerryWagner, Brennin Durfee, PA-C 02/24/18 1408    Emily FilbertWilliams, Jonathan E, MD 02/24/18 [phone]

## 2018-03-02 ENCOUNTER — Other Ambulatory Visit: Payer: Self-pay | Admitting: Family Medicine

## 2018-03-02 DIAGNOSIS — J302 Other seasonal allergic rhinitis: Secondary | ICD-10-CM

## 2018-03-03 NOTE — Telephone Encounter (Signed)
Flonase nasal spray refill request  LOV 02/01/18 with Dr. Katrinka BlazingSmith.    [address]  [facility] - [address]

## 2018-03-17 ENCOUNTER — Other Ambulatory Visit: Payer: Self-pay

## 2018-03-17 ENCOUNTER — Encounter: Payer: Self-pay | Admitting: Physician Assistant

## 2018-03-17 ENCOUNTER — Ambulatory Visit: Payer: BC Managed Care – PPO | Admitting: Physician Assistant

## 2018-03-17 VITALS — BP 120/74 | HR 91 | Temp 98.5°F | Resp 16 | Ht 65.0 in | Wt 281.8 lb

## 2018-03-17 DIAGNOSIS — M542 Cervicalgia: Secondary | ICD-10-CM

## 2018-03-17 DIAGNOSIS — S0501XA Injury of conjunctiva and corneal abrasion without foreign body, right eye, initial encounter: Secondary | ICD-10-CM | POA: Diagnosis not present

## 2018-03-17 MED ORDER — POLYMYXIN B-TRIMETHOPRIM 10000-0.1 UNIT/ML-% OP SOLN: 1.0000 [drp] | Freq: Four times a day (QID) | OPHTHALMIC | 0 refills | Status: DC

## 2018-03-17 NOTE — Progress Notes (Signed)
Rachel Walls  MRN: [id number] DOB: 10/08/1966  PCP: Ethelda ChickSmith, Kristi M, MD  Subjective:  Pt is a 52 year old female who presents to clinic for several complaints.   Neck pain x 2 days. Pain is of the front of her neck, right of center. Started 2 nights ago while she was sitting and working - was hard to swallow at that point - this has resolved. Pain is of right side is painful and tight. Yawning makes is worse, "tightness".  Denies shob, difficulty breathing, fever, chills, cough, sore throat, HA, left arm pain, chest pain, palpitations, bad taste in mouth.  No recent increased movement/activity.    She has taken Advil for this - "not as tight anymore".   Eye redness x 2 days. Right eye is red and painful in the outer corner. Is bothersome and aching. "feels scratchy". Denies itching, vision changes, HA, eye drainage, FB sensation. She does not wear contracts.   She is currently on z-pack for sinus infection.  Seasonal allergies- take flonase and claritin. Last dose flonase last week. No claritin yet this season.    has a past medical history of Allergic rhinitis, cause unspecified, Anemia, Anxiety, Arthritis, Asthma, Chronic kidney disease, Diabetes mellitus, GERD (gastroesophageal reflux disease), Hypertension, Hypertensive retinopathy, Hypothyroidism, Nonspecific elevation of levels of transaminase or lactic acid dehydrogenase (LDH), Obesity, unspecified, Other and unspecified hyperlipidemia, Psychic factors associated with disease, Unspecified vitamin D deficiency, Venous engorgement of retina, and Wegener's granulomatosis (HCC).  Review of Systems  Constitutional: Negative for chills, diaphoresis, fatigue and fever.  HENT: Negative for congestion, postnasal drip, rhinorrhea, sinus pressure, sinus pain, sneezing and sore throat.   Eyes: Positive for pain (discomfort) and redness. Negative for photophobia, discharge, itching and visual disturbance.  Respiratory: Negative for cough,  chest tightness and wheezing.   Cardiovascular: Negative for chest pain and palpitations.  Gastrointestinal: Negative for nausea and vomiting.  Musculoskeletal: Positive for neck pain. Negative for neck stiffness.  Skin: Negative.   Allergic/Immunologic: Positive for environmental allergies.  Neurological: Negative for dizziness and headaches.    Patient Active Problem List   Diagnosis Date Noted  . Morbid obesity (HCC) 11/25/2016  . Diabetic peripheral neuropathy (HCC) 01/23/2013  . Anxiety and depression 01/23/2013  . Type 2 diabetes mellitus with diabetic polyneuropathy, with long-term current use of insulin (HCC) 09/13/2012  . Essential hypertension, benign 09/13/2012  . Pure hypercholesterolemia 09/13/2012  . Hypothyroidism 09/13/2012  . Hepatomegaly 09/13/2012  . Allergic rhinitis 09/13/2012  . Wegener's granulomatosis (HCC) 09/13/2012  . Vitamin D deficiency 09/13/2012  . Low iron 09/13/2012  . Vitamin B deficiency 09/13/2012    Current Outpatient Medications on File Prior to Visit  Medication Sig Dispense Refill  . atorvastatin (LIPITOR) 10 MG tablet Take 1 tablet (10 mg total) by mouth daily. 90 tablet 3  . celecoxib (CELEBREX) 200 MG capsule Take 1 capsule (200 mg total) by mouth daily. 90 capsule 0  . DULoxetine (CYMBALTA) 60 MG capsule Take 1 capsule (60 mg total) by mouth 2 (two) times daily. 180 capsule 3  . empagliflozin (JARDIANCE) 25 MG TABS tablet Take 25 mg by mouth daily. 90 tablet 3  . fluticasone (FLONASE) 50 MCG/ACT nasal spray Place 2 sprays into both nostrils daily. 16 g 0  . gabapentin (NEURONTIN) 800 MG tablet Take 1 tablet (800 mg total) by mouth 3 (three) times daily. 270 tablet 1  . glucose blood (ONE TOUCH ULTRA TEST) test strip Use as instructed--check sugar 3 TIMES A DAY. 100  each 0  . ibuprofen (ADVIL,MOTRIN) 800 MG tablet Take 1 tablet (800 mg total) by mouth every 8 (eight) hours as needed. 30 tablet 0  . Insulin Glargine (BASAGLAR KWIKPEN) 100  UNIT/ML SOPN Inject 0.64 mLs (64 Units total) into the skin daily at 10 pm. 30 mL 11  . Insulin Pen Needle (PEN NEEDLES) 32G X 4 MM MISC Inject 1 each into the skin daily. (DX: E11.9) 100 each 4  . lansoprazole (PREVACID) 30 MG capsule Take 1 capsule (30 mg total) by mouth daily. 90 capsule 3  . levothyroxine (SYNTHROID, LEVOTHROID) 100 MCG tablet Take 1 tablet (100 mcg total) by mouth daily. 90 tablet 3  . metFORMIN (GLUCOPHAGE) 500 MG tablet Take 1 tablet (500 mg total) by mouth 2 (two) times daily with a meal. With food. 180 tablet 1  . mycophenolate (CELLCEPT) 250 MG capsule Take 250 mg by mouth at bedtime. 2 tab in the morning & 1 tab at night.    . ramipril (ALTACE) 5 MG capsule Take 1 capsule (5 mg total) by mouth daily. 90 capsule 3  . Vitamin D, Ergocalciferol, (DRISDOL) 50000 units CAPS capsule Take 1 capsule (50,000 Units total) by mouth every 7 (seven) days. 12 capsule 0  . albuterol (PROVENTIL HFA;VENTOLIN HFA) 108 (90 BASE) MCG/ACT inhaler Inhale 90 mcg into the lungs every 4 (four) hours as needed.     No current facility-administered medications on file prior to visit.     Allergies  Allergen Reactions  . Penicillins Hives  . Sulfonamide Derivatives     Reaction: arthralgias.     Objective:  BP 120/74   Pulse 91   Temp 98.5 F (36.9 C) (Oral)   Resp 16   Ht 5\' 5"  (1.651 m)   Wt 281 lb 12.8 oz (127.8 kg)   LMP 06/24/2015 Comment: per patient periods just decided to stop  SpO2 96%   BMI 46.89 kg/m   Physical Exam  Constitutional: She is oriented to person, place, and time. No distress.  HENT:  Right Ear: Tympanic membrane normal.  Left Ear: Tympanic membrane normal.  Mouth/Throat: Uvula is midline, oropharynx is clear and moist and mucous membranes are normal.  Neck:    Cardiovascular: Normal rate, regular rhythm and normal heart sounds.  Neurological: She is alert and oriented to person, place, and time.  Skin: Skin is warm and dry.  Psychiatric: Judgment  normal.  Vitals reviewed.   Fluorescein eye exam performed. Increased uptake lateral cornea.   Assessment and Plan :  1. Abrasion of right cornea, initial encounter - trimethoprim-polymyxin b (POLYTRIM) ophthalmic solution; Place 1 drop into the right eye every 6 (six) hours. For three to five days  Dispense: 10 mL; Refill: 0  2. Neck pain on right side - Pt presents with right-sided neck pain x 2 days. TTP with no associated swelling, fluctuance, warmth, erythema. Vitals are stable. No red flag symptoms. Airway is clear. Suspect MSK pain. Advised tylenol for pain. RTC in 3-5 days if worsening/not improving symptoms. Consider imaging. She understands and agrees with plan.    Marco Collie, PA-C  Primary Care at [facility] 03/17/2018 12:08 PM

## 2018-03-17 NOTE — Patient Instructions (Addendum)
Start taking Tylenol for the next 2-3 days for your neck pain. You can also try applying a warm compress to the area. Be sure you are sleeping in a comfortable position with a supportive pillow.  Come back if you are not improving in 5-7 days - come back sooner if your symptoms worsen.   Start using Polytrim eye drops: 1 drop, four times per day for three to five days.   Consider starting your allergy medication on a daily basis for the next month or so.   Polymyxin B; Trimethoprim eye drops, solution What is this medicine? POLYMYXIN B and TRIMETHOPRIM (pol i MIX in B and trye METH oh prim) eye drops treat certain eye infections caused by bacteria. This medicine may be used for other purposes; ask your health care provider or pharmacist if you have questions. COMMON BRAND NAME(S): Polytrim What should I tell my health care provider before I take this medicine? They need to know if you have any of these conditions: -wear contact lenses -an unusual or allergic reaction to polymyxin B, trimethoprim, other medicines, foods, dyes, or preservatives -pregnant or trying to get pregnant -breast-feeding How should I use this medicine? This medicine is used in the eye. Follow the directions on the prescription label. Wash your hands before and after use. Tilt your head back slightly. Pull your lower eyelid down gently to form a pouch. Do not touch the tip of the dropper to your eye, fingertips, or other surface. Squeeze the prescribed number of drops into the pouch. Close the eye gently to spread the drops. Use your medicine at regular intervals. Do not take your medicine more often than directed. Use all of your medicine as directed even if you think your are better. Do not skip doses or stop your medicine early. Talk to your pediatrician regarding the use of this medicine in children. While this drug may be prescribed for children and infants for selected conditions, precautions do apply. Overdosage: If  you think you have taken too much of this medicine contact a poison control center or emergency room at once. NOTE: This medicine is only for you. Do not share this medicine with others. What if I miss a dose? If you miss a dose, use it as soon as you can. If it is almost time for your next dose, use only that dose. Do not use double or extra doses. What may interact with this medicine? Interactions are not expected. Do not use any other eye products without advice of your doctor or health care professional. This list may not describe all possible interactions. Give your health care provider a list of all the medicines, herbs, non-prescription drugs, or dietary supplements you use. Also tell them if you smoke, drink alcohol, or use illegal drugs. Some items may interact with your medicine. What should I watch for while using this medicine? Check with your doctor or health care professional if your condition does not get better after 5 days, or if it gets worse. If you wear contact lenses, ask when you can use your lenses again. A burning or stinging reaction that does not go away may mean you are allergic to this product. Stop use and call your doctor or health care professional. To prevent the spread of infection, do not share eye products or other personal items with anyone else. What side effects may I notice from receiving this medicine? Side effects that you should report to your doctor or health care professional as soon  as possible: -burning, stinging, or swelling -change in vision or blurred vision that will not go away -eye pain -itching and redness -rash Side effects that usually do not require medical attention (report to your doctor or health care professional if they continue or are bothersome): -temporary blurred vision after applying -temporary watering or stinging This list may not describe all possible side effects. Call your doctor for medical advice about side effects. You  may report side effects to FDA at 1-800-FDA-1088. Where should I keep my medicine? Keep out of the reach of children. Store at room temperature 15 to 25 degrees C (59 to 77 degrees F). Protect from light. To prevent the spread of infection, it is best to throw away any unused eye drops after you finish the course of treatment. Throw away any unused medicine after the expiration date. NOTE: This sheet is a summary. It may not cover all possible information. If you have questions about this medicine, talk to your doctor, pharmacist, or health care provider.  2018 Elsevier/Gold Standard (2008-06-13 11:36:42)  IF you received an x-ray today, you will receive an invoice from [facility]. Please contact [facility] at [phone] with questions or concerns regarding your invoice.   IF you received labwork today, you will receive an invoice from [facility]. Please contact [facility] at [phone] with questions or concerns regarding your invoice.   Our billing staff will not be able to assist you with questions regarding bills from these companies.  You will be contacted with the lab results as soon as they are available. The fastest way to get your results is to activate your My Chart account. Instructions are located on the last page of this paperwork. If you have not heard from us regarding the results in 2 weeks, please contact this office.

## 2018-03-25 ENCOUNTER — Other Ambulatory Visit: Payer: Self-pay | Admitting: Family Medicine

## 2018-03-26 ENCOUNTER — Other Ambulatory Visit: Payer: Self-pay | Admitting: Family Medicine

## 2018-04-12 ENCOUNTER — Other Ambulatory Visit: Payer: Self-pay | Admitting: Family Medicine

## 2018-04-12 DIAGNOSIS — E1142 Type 2 diabetes mellitus with diabetic polyneuropathy: Secondary | ICD-10-CM

## 2018-04-19 ENCOUNTER — Encounter: Payer: Self-pay | Admitting: Family Medicine

## 2018-04-27 ENCOUNTER — Other Ambulatory Visit: Payer: Self-pay | Admitting: Family Medicine

## 2018-04-27 DIAGNOSIS — J302 Other seasonal allergic rhinitis: Secondary | ICD-10-CM

## 2018-05-05 ENCOUNTER — Ambulatory Visit: Payer: BC Managed Care – PPO | Admitting: Family Medicine

## 2018-05-05 ENCOUNTER — Other Ambulatory Visit: Payer: Self-pay

## 2018-05-05 ENCOUNTER — Encounter: Payer: Self-pay | Admitting: Family Medicine

## 2018-05-05 VITALS — BP 110/80 | HR 101 | Temp 98.0°F | Resp 16 | Ht 64.76 in | Wt 281.0 lb

## 2018-05-05 DIAGNOSIS — I10 Essential (primary) hypertension: Secondary | ICD-10-CM

## 2018-05-05 DIAGNOSIS — J302 Other seasonal allergic rhinitis: Secondary | ICD-10-CM

## 2018-05-05 DIAGNOSIS — E1142 Type 2 diabetes mellitus with diabetic polyneuropathy: Secondary | ICD-10-CM | POA: Diagnosis not present

## 2018-05-05 DIAGNOSIS — F419 Anxiety disorder, unspecified: Secondary | ICD-10-CM | POA: Diagnosis not present

## 2018-05-05 DIAGNOSIS — E7800 Pure hypercholesterolemia, unspecified: Secondary | ICD-10-CM

## 2018-05-05 DIAGNOSIS — F32A Depression, unspecified: Secondary | ICD-10-CM

## 2018-05-05 DIAGNOSIS — Z794 Long term (current) use of insulin: Secondary | ICD-10-CM | POA: Diagnosis not present

## 2018-05-05 DIAGNOSIS — E034 Atrophy of thyroid (acquired): Secondary | ICD-10-CM | POA: Diagnosis not present

## 2018-05-05 DIAGNOSIS — R000 Tachycardia, unspecified: Secondary | ICD-10-CM

## 2018-05-05 DIAGNOSIS — F329 Major depressive disorder, single episode, unspecified: Secondary | ICD-10-CM

## 2018-05-05 LAB — POCT GLYCOSYLATED HEMOGLOBIN (HGB A1C): HEMOGLOBIN A1C: 7.7 % — AB (ref 4.0–5.6)

## 2018-05-05 LAB — GLUCOSE, POCT (MANUAL RESULT ENTRY): POC Glucose: 302 mg/dl — AB (ref 70–99)

## 2018-05-05 MED ORDER — GLUCOSE BLOOD VI STRP: ORAL_STRIP | 6 refills | Status: AC

## 2018-05-05 MED ORDER — CELECOXIB 200 MG PO CAPS: 200.0000 mg | ORAL_CAPSULE | Freq: Every day | ORAL | 1 refills | Status: AC

## 2018-05-05 MED ORDER — FLUTICASONE PROPIONATE 50 MCG/ACT NA SUSP: 2.0000 | Freq: Every day | NASAL | 3 refills | Status: AC

## 2018-05-05 MED ORDER — METFORMIN HCL 500 MG PO TABS: 500.0000 mg | ORAL_TABLET | Freq: Two times a day (BID) | ORAL | 3 refills | Status: AC

## 2018-05-05 MED ORDER — GABAPENTIN 800 MG PO TABS: 800.0000 mg | ORAL_TABLET | Freq: Three times a day (TID) | ORAL | 1 refills | Status: AC

## 2018-05-05 NOTE — Patient Instructions (Signed)
     IF you received an x-ray today, you will receive an invoice from [facility]. Please contact [facility] at [phone] with questions or concerns regarding your invoice.   IF you received labwork today, you will receive an invoice from [facility]. Please contact [facility] at [phone] with questions or concerns regarding your invoice.   Our billing staff will not be able to assist you with questions regarding bills from these companies.  You will be contacted with the lab results as soon as they are available. The fastest way to get your results is to activate your My Chart account. Instructions are located on the last page of this paperwork. If you have not heard from us regarding the results in 2 weeks, please contact this office.     

## 2018-05-05 NOTE — Progress Notes (Signed)
Subjective:    Patient ID: Rachel Walls, female    DOB: 1966-04-13, 52 y.o.   MRN: [id number]  05/05/2018  Chronic Conditions (3 month follow-up)    HPI This 52 y.o. female presents for three month follow-up of DMII, hypertension, hypercholesterolemia. Dr. Terie Purser is concerned about resting heart rate.   Dr. Terie Purser also expressed concern about iron deficiency. Worried about esophagitis.  Taking Prevacid.   Iron is low; not anemic.    No n/v/abdominal pain, change in stool habits; no bloody stools or black stools.  Appetite is normal.  No menses in a few years.   Victoza caused nausea.  Hoarseness.  Cutting out ice cream.   BP Readings from Last 3 Encounters:  05/05/18 110/80  03/17/18 120/74  02/24/18 119/74   Wt Readings from Last 3 Encounters:  05/05/18 281 lb (127.5 kg)  03/17/18 281 lb 12.8 oz (127.8 kg)  02/24/18 280 lb (127 kg)   Immunization History  Administered Date(s) Administered  . Hepatitis A, Adult 09/28/2013, 05/29/2014  . Hepatitis B 03/16/2013  . Hepatitis B, adult 06/27/2013, 09/28/2013  . Influenza Split 08/06/2010  . Influenza,inj,Quad PF,6+ Mos 09/28/2013, 08/28/2014, 08/10/2015, 08/16/2016, 08/31/2017  . Pneumococcal Conjugate-13 11/01/2014  . Pneumococcal Polysaccharide-23 11/24/1998, 09/28/2013  . Tdap 01/18/2011    Review of Systems  Constitutional: Negative for activity change, appetite change, chills, diaphoresis, fatigue, fever and unexpected weight change.  HENT: Positive for voice change. Negative for congestion, dental problem, drooling, ear discharge, ear pain, facial swelling, hearing loss, mouth sores, nosebleeds, postnasal drip, rhinorrhea, sinus pressure, sneezing, sore throat, tinnitus and trouble swallowing.   Eyes: Negative for photophobia, pain, discharge, redness, itching and visual disturbance.  Respiratory: Negative for apnea, cough, choking, chest tightness, shortness of breath, wheezing and stridor.   Cardiovascular:  Negative for chest pain, palpitations and leg swelling.  Gastrointestinal: Negative for abdominal distention, abdominal pain, anal bleeding, blood in stool, constipation, diarrhea, nausea, rectal pain and vomiting.  Endocrine: Negative for cold intolerance, heat intolerance, polydipsia, polyphagia and polyuria.  Genitourinary: Negative for decreased urine volume, difficulty urinating, dyspareunia, dysuria, enuresis, flank pain, frequency, genital sores, hematuria, menstrual problem, pelvic pain, urgency, vaginal bleeding, vaginal discharge and vaginal pain.  Musculoskeletal: Negative for arthralgias, back pain, gait problem, joint swelling, myalgias, neck pain and neck stiffness.  Skin: Negative for color change, pallor, rash and wound.  Allergic/Immunologic: Negative for environmental allergies, food allergies and immunocompromised state.  Neurological: Negative for dizziness, tremors, seizures, syncope, facial asymmetry, speech difficulty, weakness, light-headedness, numbness and headaches.  Hematological: Negative for adenopathy. Does not bruise/bleed easily.  Psychiatric/Behavioral: Negative for agitation, behavioral problems, confusion, decreased concentration, dysphoric mood, hallucinations, self-injury, sleep disturbance and suicidal ideas. The patient is not nervous/anxious and is not hyperactive.     Past Medical History:  Diagnosis Date  . Allergic rhinitis, cause unspecified   . Anemia   . Anxiety   . Arthritis   . Asthma   . Chronic kidney disease   . Diabetes mellitus    Type 2  . GERD (gastroesophageal reflux disease)   . Hypertension   . Hypertensive retinopathy   . Hypothyroidism   . Nonspecific elevation of levels of transaminase or lactic acid dehydrogenase (LDH)   . Obesity, unspecified   . Other and unspecified hyperlipidemia   . Psychic factors associated with disease    Psychogenic factors with other diseases  . Unspecified vitamin D deficiency   . Venous  engorgement of retina   . Wegener's granulomatosis (HCC)  Past Surgical History:  Procedure Laterality Date  . abdominal ultrasound  05/17/2012   severe hepatomegaly at 22 cm; gallbladder normal.   [facility].  . Calcaneous bone spur surgery     x2  . CT abdomen  06/10/12   Hepatomegaly.  [facility].  Marland Kitchen. ESOPHAGOGASTRODUODENOSCOPY  08/19/12   diffuse gastritis antrum; pathology negative for malignancy, H. Pylori; esophagus and duodenum normal.  . hida scan  08/27/12   normal; EF 62%.  [facility].  Marland Kitchen. LUNG BIOPSY  1999  . Meniscus tear     L; x 3 tears.  [facility]  Marland Kitchen. RENAL BIOPSY  1999  . 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 TONSILLECTOMY     Allergies  Allergen Reactions  . Penicillins Hives  . Sulfonamide Derivatives     Reaction: arthralgias.   Current Outpatient Medications on File Prior to Visit  Medication Sig Dispense Refill  . atorvastatin (LIPITOR) 10 MG tablet Take 1 tablet (10 mg total) by mouth daily. 90 tablet 3  . DULoxetine (CYMBALTA) 60 MG capsule Take 1 capsule (60 mg total) by mouth 2 (two) times daily. 180 capsule 3  . empagliflozin (JARDIANCE) 25 MG TABS tablet Take 25 mg by mouth daily. 90 tablet 3  . Insulin Glargine (BASAGLAR KWIKPEN) 100 UNIT/ML SOPN Inject 0.64 mLs (64 Units total) into the skin daily at 10 pm. 30 mL 11  . Insulin Pen Needle (PEN NEEDLES) 32G X 4 MM MISC Inject 1 each into the skin daily. (DX: E11.9) 100 each 4  . lansoprazole (PREVACID) 30 MG capsule Take 1 capsule (30 mg total) by mouth daily. 90 capsule 3  . levofloxacin (LEVAQUIN) 750 MG tablet Take by mouth.    . levothyroxine (SYNTHROID, LEVOTHROID) 100 MCG tablet Take 1 tablet (100 mcg total) by mouth daily. 90 tablet 3  . loratadine (CLARITIN) 10 MG tablet Take by mouth.    . mycophenolate (CELLCEPT) 250 MG capsule Take 250 mg by mouth at bedtime. 2 tab in the morning & 1 tab at night.    . ramipril (ALTACE) 5 MG capsule Take 1 capsule (5 mg total) by mouth  daily. 90 capsule 3  . sitaGLIPtin (JANUVIA) 50 MG tablet 100 mg daily.    Marland Kitchen. trimethoprim-polymyxin b (POLYTRIM) ophthalmic solution Place 1 drop into the right eye every 6 (six) hours. For three to five days 10 mL 0  . albuterol (PROVENTIL HFA;VENTOLIN HFA) 108 (90 BASE) MCG/ACT inhaler Inhale 90 mcg into the lungs every 4 (four) hours as needed.     No current facility-administered medications on file prior to visit.    Social History   Socioeconomic History  . Marital status: Married    Spouse name: Renella CunasShawn Kirkman  . Number of children: 0  . Years of education: college  . Highest education level: Not on file  Occupational History  . Occupation: Magazine features editorTEACHER    Employer: [employer]    Comment: Full time  Social Needs  . Financial resource strain: Not on file  . Food insecurity:    Worry: Not on file    Inability: Not on file  . Transportation needs:    Medical: Not on file    Non-medical: Not on file  Tobacco Use  . Smoking status: Never Smoker  . Smokeless tobacco: Never Used  . Tobacco comment: Tobacco use-no  Substance and Sexual Activity  . Alcohol use: No  . Drug use: No  . Sexual activity: Yes  Partners: Male    Birth control/protection: Post-menopausal  Lifestyle  . Physical activity:    Days per week: Not on file    Minutes per session: Not on file  . Stress: Not on file  Relationships  . Social connections:    Talks on phone: Not on file    Gets together: Not on file    Attends religious service: Not on file    Active member of club or organization: Not on file    Attends meetings of clubs or organizations: Not on file    Relationship status: Not on file  . Intimate partner violence:    Fear of current or ex partner: Not on file    Emotionally abused: Not on file    Physically abused: Not on file    Forced sexual activity: Not on file  Other Topics Concern  . Not on file  Social History Narrative   Marital status:  Married x 27 years;happily married; no  abuse.      Children: none      Lives: with husband, 3 cats, 1 dog.      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      Tobacco: never      Alcohol: never      Drugs: none      Exercise: walking in 2019.      Seatbelt: 100%; no texting while driving.      Guns: none          Family History  Problem Relation Age of Onset  . Hyperlipidemia Mother        Hypercholesterolemia  . Arthritis Mother        OA  . Sarcoidosis Father   . Heart disease Maternal Grandfather   . Emphysema Maternal Grandfather        Cause of death  . Diabetes Maternal Grandfather   . Heart failure Paternal Grandfather        CHF  . Heart disease Paternal Grandfather   . Asthma Brother   . Alcohol abuse Brother   . Emphysema Maternal Grandmother   . Breast cancer Neg Hx        Objective:    BP 110/80   Pulse (!) 101   Temp 98 F (36.7 C) (Oral)   Resp 16   Ht 5' 4.76" (1.645 m)   Wt 281 lb (127.5 kg)   LMP 06/24/2015 Comment: per patient periods just decided to stop  SpO2 98%   BMI 47.10 kg/m  Physical Exam  Constitutional: She is oriented to person, place, and time. She appears well-developed and well-nourished. No distress.  HENT:  Head: Normocephalic and atraumatic.  Right Ear: External ear normal.  Left Ear: External ear normal.  Nose: Nose normal.  Mouth/Throat: Oropharynx is clear and moist.  Eyes: Pupils are equal, round, and reactive to light. Conjunctivae and EOM are normal.  Neck: Normal range of motion. Neck supple. Carotid bruit is not present. No thyromegaly present.  Cardiovascular: Normal rate, regular rhythm, normal heart sounds and intact distal pulses. Exam reveals no gallop and no friction rub.  No murmur heard. Pulmonary/Chest: Effort normal and breath sounds normal. She has no wheezes. She has no rales.  Abdominal: Soft. Bowel sounds are normal. She exhibits no distension and no mass. There is no tenderness. There  is no rebound and no guarding.  Lymphadenopathy:    She has no cervical adenopathy.  Neurological: She  is alert and oriented to person, place, and time. No cranial nerve deficit.  Skin: Skin is warm and dry. No rash noted. She is not diaphoretic. No erythema. No pallor.  Psychiatric: She has a normal mood and affect. Her behavior is normal.   No results found. Depression screen [facility] 2/9 05/05/2018 03/17/2018 02/01/2018 10/30/2017 08/31/2017  Decreased Interest 0 0 0 0 0  Down, Depressed, Hopeless 0 0 0 0 0  PHQ - 2 Score 0 0 0 0 0   Fall Risk  05/05/2018 03/17/2018 02/01/2018 10/30/2017 08/31/2017  Falls in the past year? No No No No No  Number falls in past yr: - - - - -  Injury with Fall? - - - - -  Comment - - - - -        Assessment & Plan:   1. Type 2 diabetes mellitus with diabetic polyneuropathy, with long-term current use of insulin (HCC)   2. Hypothyroidism due to acquired atrophy of thyroid   3. Essential hypertension, benign   4. Anxiety and depression   5. Pure hypercholesterolemia   6. Diabetic peripheral neuropathy (HCC)   7. Other seasonal allergic rhinitis   8. Tachycardia     Tachycardia: mild and recovers well with rest.  No recorded heart rates above 120.  Consistent with morbid obesity induced tachycardia and poorly controlled diabetes and deconditioning due to lack of exercise.  Discussed with patient that we can continue to monitor with goal of improved diabetes control, with weight loss, and with exercise.  Other option is to start rate controlling medication such as Metoprolol; pt desires not to add additional medication.  No evidence of anemia to be cause of tachycardia with stable hemoglobin of 11.3-12.3 in the past year.  DMII: uncontrolled; rechallenge with Metformin 500mg  bid with meals.    Hypothyroidism, hypertension, hypercholesterolemia: controlled; obtain labs; continue medications.  Orders Placed This Encounter  Procedures  . CBC with  Differential/Platelet  . Comprehensive metabolic panel    Order Specific Question:   Has the patient fasted?    Answer:   No  . Lipid panel    Order Specific Question:   Has the patient fasted?    Answer:   No  . POCT glycosylated hemoglobin (Hb A1C)  . POCT glucose (manual entry)   Meds ordered this encounter  Medications  . metFORMIN (GLUCOPHAGE) 500 MG tablet    Sig: Take 1 tablet (500 mg total) by mouth 2 (two) times daily with a meal. With food.    Dispense:  180 tablet    Refill:  3  . glucose blood (ONE TOUCH ULTRA TEST) test strip    Sig: Use as instructed--check sugar 3 TIMES A DAY.    Dispense:  100 each    Refill:  6  . gabapentin (NEURONTIN) 800 MG tablet    Sig: Take 1 tablet (800 mg total) by mouth 3 (three) times daily.    Dispense:  270 tablet    Refill:  1  . fluticasone (FLONASE) 50 MCG/ACT nasal spray    Sig: Place 2 sprays into both nostrils daily.    Dispense:  48 g    Refill:  3  . celecoxib (CELEBREX) 200 MG capsule    Sig: Take 1 capsule (200 mg total) by mouth daily.    Dispense:  90 capsule    Refill:  1    Return in about 3 months (around 08/05/2018) for follow-up chronic medical conditions [place].   Shunsuke Granzow Daphine Deutscher  Katrinka Blazing, M.D. Primary Care at [facility] previously [facility] [address] [place], [place]  [address] [phone] phone [phone] fax

## 2018-05-06 LAB — LIPID PANEL
CHOL/HDL RATIO: 3.2 ratio (ref 0.0–4.4)
Cholesterol, Total: 136 mg/dL (ref 100–199)
HDL: 43 mg/dL (ref >39)
LDL CALC: 49 mg/dL (ref 0–99)
Triglycerides: 222 mg/dL — ABNORMAL HIGH (ref 0–149)
VLDL Cholesterol Cal: 44 mg/dL — ABNORMAL HIGH (ref 5–40)

## 2018-05-06 LAB — COMPREHENSIVE METABOLIC PANEL
ALT: 18 IU/L (ref 0–32)
AST: 15 IU/L (ref 0–40)
Albumin/Globulin Ratio: 1.5 (ref 1.2–2.2)
Albumin: 4.1 g/dL (ref 3.5–5.5)
Alkaline Phosphatase: 91 IU/L (ref 39–117)
BUN/Creatinine Ratio: 23 (ref 9–23)
BUN: 22 mg/dL (ref 6–24)
Bilirubin Total: 0.3 mg/dL (ref 0.0–1.2)
CALCIUM: 9.3 mg/dL (ref 8.7–10.2)
CO2: 23 mmol/L (ref 20–29)
CREATININE: 0.96 mg/dL (ref 0.57–1.00)
Chloride: 103 mmol/L (ref 96–106)
GFR calc Af Amer: 79 mL/min/{1.73_m2} (ref >59)
GFR, EST NON AFRICAN AMERICAN: 68 mL/min/{1.73_m2} (ref >59)
GLOBULIN, TOTAL: 2.7 g/dL (ref 1.5–4.5)
Glucose: 264 mg/dL — ABNORMAL HIGH (ref 65–99)
Potassium: 4.3 mmol/L (ref 3.5–5.2)
SODIUM: 140 mmol/L (ref 134–144)
TOTAL PROTEIN: 6.8 g/dL (ref 6.0–8.5)

## 2018-05-06 LAB — CBC WITH DIFFERENTIAL/PLATELET
Basophils Absolute: 0.1 10*3/uL (ref 0.0–0.2)
Basos: 1 %
EOS (ABSOLUTE): 0.4 10*3/uL (ref 0.0–0.4)
EOS: 4 %
HEMATOCRIT: 36.2 % (ref 34.0–46.6)
HEMOGLOBIN: 11.3 g/dL (ref 11.1–15.9)
IMMATURE GRANS (ABS): 0 10*3/uL (ref 0.0–0.1)
Immature Granulocytes: 0 %
LYMPHS ABS: 2.2 10*3/uL (ref 0.7–3.1)
LYMPHS: 20 %
MCH: 22.7 pg — ABNORMAL LOW (ref 26.6–33.0)
MCHC: 31.2 g/dL — ABNORMAL LOW (ref 31.5–35.7)
MCV: 73 fL — ABNORMAL LOW (ref 79–97)
MONOCYTES: 8 %
Monocytes Absolute: 0.9 10*3/uL (ref 0.1–0.9)
Neutrophils Absolute: 7.3 10*3/uL — ABNORMAL HIGH (ref 1.4–7.0)
Neutrophils: 67 %
Platelets: 385 10*3/uL (ref 150–450)
RBC: 4.98 x10E6/uL (ref 3.77–5.28)
RDW: 17 % — ABNORMAL HIGH (ref 12.3–15.4)
WBC: 10.8 10*3/uL (ref 3.4–10.8)

## 2018-06-21 ENCOUNTER — Other Ambulatory Visit: Payer: Self-pay | Admitting: Family Medicine

## 2018-06-22 NOTE — Telephone Encounter (Signed)
Vitamin D refill Last Refill:01/05/18 # 12 Last OV: 03/17/18 PCP: Dr Katrinka BlazingSmith Pharmacy:[facility]

## 2019-04-27 ENCOUNTER — Other Ambulatory Visit: Payer: Self-pay | Admitting: Family Medicine

## 2019-04-27 DIAGNOSIS — Z1231 Encounter for screening mammogram for malignant neoplasm of breast: Secondary | ICD-10-CM

## 2019-12-05 ENCOUNTER — Ambulatory Visit
Admission: RE | Admit: 2019-12-05 | Discharge: 2019-12-05 | Disposition: A | Discharge status: Home / Self Care | Payer: BC Managed Care – PPO | Source: Ambulatory Visit | Attending: Family Medicine | Admitting: Family Medicine

## 2019-12-05 DIAGNOSIS — Z1231 Encounter for screening mammogram for malignant neoplasm of breast: Secondary | ICD-10-CM | POA: Diagnosis present

## 2020-01-22 ENCOUNTER — Ambulatory Visit: Payer: BC Managed Care – PPO | Attending: Internal Medicine

## 2020-01-22 DIAGNOSIS — Z23 Encounter for immunization: Secondary | ICD-10-CM | POA: Insufficient documentation

## 2020-01-22 NOTE — Progress Notes (Signed)
   Covid-19 Vaccination Clinic  Name:  LILLIANNA SABEL    MRN: [id number] DOB: [birth year]  01/22/2020  Ms. Wynder was observed post Covid-19 immunization for 15 minutes without incidence. She was provided with Vaccine Information Sheet and instruction to access the V-Safe system.   Ms. Nolley was instructed to call 911 with any severe reactions post vaccine: Marland Kitchen Difficulty breathing  . Swelling of your face and throat  . A fast heartbeat  . A bad rash all over your body  . Dizziness and weakness    Immunizations Administered    Name Date Dose VIS Date Route   Pfizer COVID-19 Vaccine 01/22/2020 12:01 PM 0.3 mL 11/04/2019 Intramuscular   Manufacturer: [employer], [employer]   Lot: [id number]   NDC: [address]-4

## 2020-02-14 ENCOUNTER — Ambulatory Visit: Payer: BC Managed Care – PPO | Attending: Internal Medicine

## 2020-02-14 DIAGNOSIS — Z23 Encounter for immunization: Secondary | ICD-10-CM

## 2020-02-14 NOTE — Progress Notes (Signed)
   Covid-19 Vaccination Clinic  Name:  Rachel Walls    MRN: [id number] DOB: [birth year]  02/14/2020  [name] was observed post Covid-19 immunization for 15 minutes without incident. She was provided with Vaccine Information Sheet and instruction to access the V-Safe system.   [name] was instructed to call 911 with any severe reactions post vaccine: Marland Kitchen Difficulty breathing  . Swelling of face and throat  . A fast heartbeat  . A bad rash all over body  . Dizziness and weakness   Immunizations Administered    Name Date Dose VIS Date Route   Pfizer COVID-19 Vaccine 02/14/2020  2:53 PM 0.3 mL 11/04/2019 Intramuscular   Manufacturer: [employer], [employer]   Lot: [id number]   NDC: [address]-4

## 2020-07-01 ENCOUNTER — Encounter: Payer: Self-pay | Admitting: Emergency Medicine

## 2020-07-01 ENCOUNTER — Emergency Department: Payer: BC Managed Care – PPO

## 2020-07-01 ENCOUNTER — Other Ambulatory Visit: Payer: Self-pay

## 2020-07-01 ENCOUNTER — Emergency Department
Admission: EM | Admit: 2020-07-01 | Discharge: 2020-07-02 | Disposition: A | Discharge status: Home / Self Care | Payer: BC Managed Care – PPO | Attending: Emergency Medicine | Admitting: Emergency Medicine

## 2020-07-01 DIAGNOSIS — Z7951 Long term (current) use of inhaled steroids: Secondary | ICD-10-CM | POA: Diagnosis not present

## 2020-07-01 DIAGNOSIS — E1140 Type 2 diabetes mellitus with diabetic neuropathy, unspecified: Secondary | ICD-10-CM | POA: Insufficient documentation

## 2020-07-01 DIAGNOSIS — Z794 Long term (current) use of insulin: Secondary | ICD-10-CM | POA: Diagnosis not present

## 2020-07-01 DIAGNOSIS — J45909 Unspecified asthma, uncomplicated: Secondary | ICD-10-CM | POA: Diagnosis not present

## 2020-07-01 DIAGNOSIS — I2510 Atherosclerotic heart disease of native coronary artery without angina pectoris: Secondary | ICD-10-CM | POA: Diagnosis not present

## 2020-07-01 DIAGNOSIS — Z20822 Contact with and (suspected) exposure to covid-19: Secondary | ICD-10-CM | POA: Diagnosis not present

## 2020-07-01 DIAGNOSIS — A09 Infectious gastroenteritis and colitis, unspecified: Secondary | ICD-10-CM | POA: Diagnosis not present

## 2020-07-01 DIAGNOSIS — I10 Essential (primary) hypertension: Secondary | ICD-10-CM | POA: Diagnosis not present

## 2020-07-01 DIAGNOSIS — E039 Hypothyroidism, unspecified: Secondary | ICD-10-CM | POA: Insufficient documentation

## 2020-07-01 DIAGNOSIS — R109 Unspecified abdominal pain: Secondary | ICD-10-CM | POA: Diagnosis present

## 2020-07-01 DIAGNOSIS — Z79899 Other long term (current) drug therapy: Secondary | ICD-10-CM | POA: Insufficient documentation

## 2020-07-01 LAB — COMPREHENSIVE METABOLIC PANEL
ALT: 20 U/L (ref 0–44)
AST: 18 U/L (ref 15–41)
Albumin: 4.1 g/dL (ref 3.5–5.0)
Alkaline Phosphatase: 68 U/L (ref 38–126)
Anion gap: 10 (ref 5–15)
BUN: 16 mg/dL (ref 6–20)
CO2: 23 mmol/L (ref 22–32)
Calcium: 9 mg/dL (ref 8.9–10.3)
Chloride: 102 mmol/L (ref 98–111)
Creatinine, Ser: 1.18 mg/dL — ABNORMAL HIGH (ref 0.44–1.00)
GFR calc Af Amer: >60 mL/min (ref >60)
GFR calc non Af Amer: 52 mL/min — ABNORMAL LOW (ref >60)
Glucose, Bld: 120 mg/dL — ABNORMAL HIGH (ref 70–99)
Potassium: 4.9 mmol/L (ref 3.5–5.1)
Sodium: 135 mmol/L (ref 135–145)
Total Bilirubin: 1.1 mg/dL (ref 0.3–1.2)
Total Protein: 7.8 g/dL (ref 6.5–8.1)

## 2020-07-01 LAB — URINALYSIS, COMPLETE (UACMP) WITH MICROSCOPIC
Bacteria, UA: NONE SEEN
Bilirubin Urine: NEGATIVE
Glucose, UA: >=500 mg/dL — AB
Ketones, ur: 20 mg/dL — AB
Leukocytes,Ua: NEGATIVE
Nitrite: NEGATIVE
Protein, ur: 30 mg/dL — AB
Specific Gravity, Urine: 1.027 (ref 1.005–1.030)
pH: 5 (ref 5.0–8.0)

## 2020-07-01 LAB — LACTIC ACID, PLASMA: Lactic Acid, Venous: 1.2 mmol/L (ref 0.5–1.9)

## 2020-07-01 LAB — CBC
HCT: 40.5 % (ref 36.0–46.0)
Hemoglobin: 12.3 g/dL (ref 12.0–15.0)
MCH: 21.8 pg — ABNORMAL LOW (ref 26.0–34.0)
MCHC: 30.4 g/dL (ref 30.0–36.0)
MCV: 71.7 fL — ABNORMAL LOW (ref 80.0–100.0)
Platelets: 391 10*3/uL (ref 150–400)
RBC: 5.65 MIL/uL — ABNORMAL HIGH (ref 3.87–5.11)
RDW: 18.6 % — ABNORMAL HIGH (ref 11.5–15.5)
WBC: 15.3 10*3/uL — ABNORMAL HIGH (ref 4.0–10.5)
nRBC: 0 % (ref 0.0–0.2)

## 2020-07-01 LAB — LIPASE, BLOOD: Lipase: 24 U/L (ref 11–51)

## 2020-07-01 LAB — TROPONIN I (HIGH SENSITIVITY)
Troponin I (High Sensitivity): 5 ng/L (ref <18)
Troponin I (High Sensitivity): 4 ng/L (ref <18)

## 2020-07-01 LAB — SARS CORONAVIRUS 2 BY RT PCR (HOSPITAL ORDER, PERFORMED IN ~~LOC~~ HOSPITAL LAB): SARS Coronavirus 2: NEGATIVE

## 2020-07-01 MED ORDER — IBUPROFEN 600 MG PO TABS
600.0000 mg | ORAL_TABLET | ORAL | Status: AC
  Administered 2020-07-01: 600 mg via ORAL
  Filled 2020-07-01: qty 1

## 2020-07-01 MED ORDER — IOHEXOL 9 MG/ML PO SOLN
500.0000 mL | Freq: Two times a day (BID) | ORAL | Status: DC | PRN
  Administered 2020-07-01 (×2): 500 mL via ORAL

## 2020-07-01 MED ORDER — CIPROFLOXACIN HCL 500 MG PO TABS
500.0000 mg | ORAL_TABLET | Freq: Two times a day (BID) | ORAL | 0 refills | Status: DC
Start: 2020-07-01 — End: 2023-02-12

## 2020-07-01 MED ORDER — ONDANSETRON 4 MG PO TBDP: 4.0000 mg | ORAL_TABLET | Freq: Four times a day (QID) | ORAL | 0 refills | Status: DC | PRN

## 2020-07-01 MED ORDER — METRONIDAZOLE 500 MG PO TABS
500.0000 mg | ORAL_TABLET | Freq: Once | ORAL | Status: AC
  Administered 2020-07-02: 500 mg via ORAL
  Filled 2020-07-01: qty 1

## 2020-07-01 MED ORDER — METRONIDAZOLE IN NACL 5-0.79 MG/ML-% IV SOLN: 500.0000 mg | Freq: Three times a day (TID) | INTRAVENOUS | Status: DC

## 2020-07-01 MED ORDER — SODIUM CHLORIDE 0.9 % IV BOLUS
1000.0000 mL | Freq: Once | INTRAVENOUS | Status: AC
  Administered 2020-07-01: 1000 mL via INTRAVENOUS

## 2020-07-01 MED ORDER — CIPROFLOXACIN HCL 500 MG PO TABS
500.0000 mg | ORAL_TABLET | Freq: Once | ORAL | Status: AC
  Administered 2020-07-02: 500 mg via ORAL
  Filled 2020-07-01: qty 1

## 2020-07-01 MED ORDER — ACETAMINOPHEN 500 MG PO TABS
1000.0000 mg | ORAL_TABLET | ORAL | Status: AC
  Administered 2020-07-01: 1000 mg via ORAL
  Filled 2020-07-01: qty 2

## 2020-07-01 MED ORDER — METRONIDAZOLE 500 MG PO TABS
500.0000 mg | ORAL_TABLET | Freq: Three times a day (TID) | ORAL | 0 refills | Status: DC
Start: 2020-07-01 — End: 2023-02-12

## 2020-07-01 MED ORDER — ACETAMINOPHEN 325 MG PO TABS
650.0000 mg | ORAL_TABLET | Freq: Once | ORAL | Status: AC
  Administered 2020-07-01: 650 mg via ORAL
  Filled 2020-07-01: qty 2

## 2020-07-01 MED ORDER — CIPROFLOXACIN IN D5W 400 MG/200ML IV SOLN: 400.0000 mg | Freq: Two times a day (BID) | INTRAVENOUS | Status: DC

## 2020-07-01 MED ORDER — IOHEXOL 350 MG/ML SOLN
100.0000 mL | Freq: Once | INTRAVENOUS | Status: AC | PRN
  Administered 2020-07-01: 100 mL via INTRAVENOUS

## 2020-07-01 MED ORDER — SODIUM CHLORIDE 0.9% FLUSH: 3.0000 mL | Freq: Once | INTRAVENOUS | Status: DC

## 2020-07-01 NOTE — Discharge Instructions (Signed)
You were seen in the emergency room for abdominal pain. It is important that you follow up closely with your primary care doctor in the next couple of days. ° ° °Please return to the emergency room right away if you are to develop a fever, severe nausea, your pain becomes severe or worsens, you are unable to keep food down, begin vomiting any dark or bloody fluid, you develop any dark or bloody stools, feel dehydrated, or other new concerns or symptoms arise. ° ° °

## 2020-07-01 NOTE — ED Notes (Addendum)
Lab, [place], called for culture add on

## 2020-07-01 NOTE — ED Notes (Signed)
Pt changing into gown and updated to the best of this RNs ability.

## 2020-07-01 NOTE — ED Provider Notes (Signed)
[facility] Emergency Department Provider Note  ____________________________________________   First MD Initiated Contact with Patient 07/01/20 1928     (approximate)  I have reviewed the triage vital signs and the nursing notes.   HISTORY  Chief Complaint Fever, Abdominal Pain, Headache, and Generalized Body Aches  HPI Rachel Walls is a 54 y.o. female with a history of Wegener's, diabetes chronic kidney disease  Since Friday patient reports she is been feeling fatigued, feeling an element of sinus congestion, also abdominal pain located mainly over her right upper abdomen. Slight sense of achiness over the right side of the chest and her lower right chest wall  Some slight nausea no vomiting. Does not feel nauseated now. Having some achiness as well as fever as high as 103. Took ibuprofen last night for the fever. No known Covid exposure. No known sick contacts.  No previous abdominal surgeries. Reports right now her pain seems to be located along the right upper abdomen. Has been through menopause. No lower abdominal pain.  Past Medical History:  Diagnosis Date   Allergic rhinitis, cause unspecified    Anemia    Anxiety    Arthritis    Asthma    Chronic kidney disease    Diabetes mellitus    Type 2   GERD (gastroesophageal reflux disease)    Hypertension    Hypertensive retinopathy    Hypothyroidism    Nonspecific elevation of levels of transaminase or lactic acid dehydrogenase (LDH)    Obesity, unspecified    Other and unspecified hyperlipidemia    Psychic factors associated with disease    Psychogenic factors with other diseases   Unspecified vitamin D deficiency    Venous engorgement of retina    Wegener's granulomatosis (HCC)     Patient Active Problem List   Diagnosis Date Noted   Morbid obesity (HCC) 11/25/2016   Diabetic peripheral neuropathy (HCC) 01/23/2013   Anxiety and depression 01/23/2013   Type 2  diabetes mellitus with diabetic polyneuropathy, with long-term current use of insulin (HCC) 09/13/2012   Essential hypertension, benign 09/13/2012   Pure hypercholesterolemia 09/13/2012   Hypothyroidism 09/13/2012   Hepatomegaly 09/13/2012   Allergic rhinitis 09/13/2012   Wegener's granulomatosis (HCC) 09/13/2012   Vitamin D deficiency 09/13/2012   Low iron 09/13/2012   Vitamin B deficiency 09/13/2012    Past Surgical History:  Procedure Laterality Date   abdominal ultrasound  05/17/2012   severe hepatomegaly at 22 cm; gallbladder normal.   [facility].   Calcaneous bone spur surgery     x2   CT abdomen  06/10/12   Hepatomegaly.  [facility].   ESOPHAGOGASTRODUODENOSCOPY  08/19/12   diffuse gastritis antrum; pathology negative for malignancy, H. Pylori; esophagus and duodenum normal.   hida scan  08/27/12   normal; EF 62%.  [facility].   LUNG BIOPSY  1999   Meniscus tear     L; x 3 tears.  [facility].   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 Authorizing Provider  albuterol (PROVENTIL HFA;VENTOLIN HFA) 108 (90 BASE) MCG/ACT inhaler Inhale 90 mcg into the lungs every 4 (four) hours as needed. 12/23/14 12/23/15  [provider]  atorvastatin (LIPITOR) 10 MG tablet Take 1 tablet (10 mg total) by mouth daily. 07/24/17   Ethelda Chick, MD  celecoxib (CELEBREX)  200 MG capsule Take 1 capsule (200 mg total) by mouth daily. 05/05/18   Ethelda Chick, MD  ciprofloxacin (CIPRO) 500 MG tablet Take 1 tablet (500 mg total) by mouth 2 (two) times daily. 07/01/20   Sharyn Creamer, MD  DULoxetine (CYMBALTA) 60 MG capsule Take 1 capsule (60 mg total) by mouth 2 (two) times daily. 10/30/17   Ethelda Chick, MD  empagliflozin (JARDIANCE) 25 MG TABS tablet Take 25 mg by mouth daily. 10/30/17   Ethelda Chick, MD  fluticasone Aleda Grana) 50 MCG/ACT nasal  spray Place 2 sprays into both nostrils daily. 05/05/18   Ethelda Chick, MD  gabapentin (NEURONTIN) 800 MG tablet Take 1 tablet (800 mg total) by mouth 3 (three) times daily. 05/05/18   Ethelda Chick, MD  glucose blood (ONE TOUCH ULTRA TEST) test strip Use as instructed--check sugar 3 TIMES A DAY. 05/05/18   Ethelda Chick, MD  Insulin Glargine (BASAGLAR KWIKPEN) 100 UNIT/ML SOPN Inject 0.64 mLs (64 Units total) into the skin daily at 10 pm. 10/30/17   Ethelda Chick, MD  Insulin Pen Needle (PEN NEEDLES) 32G X 4 MM MISC Inject 1 each into the skin daily. (DX: E11.9) 10/30/17   Ethelda Chick, MD  lansoprazole (PREVACID) 30 MG capsule Take 1 capsule (30 mg total) by mouth daily. 10/30/17   Ethelda Chick, MD  levofloxacin (LEVAQUIN) 750 MG tablet Take by mouth. 11/25/16   [provider]  levothyroxine (SYNTHROID, LEVOTHROID) 100 MCG tablet Take 1 tablet (100 mcg total) by mouth daily. 10/30/17   Ethelda Chick, MD  loratadine (CLARITIN) 10 MG tablet Take by mouth.    [provider]  metFORMIN (GLUCOPHAGE) 500 MG tablet Take 1 tablet (500 mg total) by mouth 2 (two) times daily with a meal. With food. 05/05/18   Ethelda Chick, MD  metroNIDAZOLE (FLAGYL) 500 MG tablet Take 1 tablet (500 mg total) by mouth 3 (three) times daily. 07/01/20   Sharyn Creamer, MD  mycophenolate (CELLCEPT) 250 MG capsule Take 250 mg by mouth at bedtime. 2 tab in the morning & 1 tab at night.    [provider]  ondansetron (ZOFRAN ODT) 4 MG disintegrating tablet Take 1 tablet (4 mg total) by mouth every 6 (six) hours as needed for nausea or vomiting. 07/01/20   Sharyn Creamer, MD  ramipril (ALTACE) 5 MG capsule Take 1 capsule (5 mg total) by mouth daily. 02/21/18   Ethelda Chick, MD  sitaGLIPtin (JANUVIA) 50 MG tablet 100 mg daily. 06/27/13   [provider]  trimethoprim-polymyxin b (POLYTRIM) ophthalmic solution Place 1 drop into the right eye every 6 (six) hours. For three to five days 03/17/18    McVey, Madelaine Bhat, PA-C  Vitamin D, Ergocalciferol, (DRISDOL) 50000 units CAPS capsule Take 1 capsule (50,000 Units total) by mouth every 7 (seven) days. 06/22/18   Ethelda Chick, MD    Allergies Penicillins and Sulfonamide derivatives  Family History  Problem Relation Age of Onset   Hyperlipidemia Mother        Hypercholesterolemia   Arthritis Mother        OA   Sarcoidosis Father    Heart disease Maternal Grandfather    Emphysema Maternal Grandfather        Cause of death   Diabetes Maternal Grandfather    Heart failure Paternal Grandfather        CHF   Heart disease Paternal Grandfather    Asthma Brother  Alcohol abuse Brother    Emphysema Maternal Grandmother    Breast cancer Neg Hx     Social History Social History   Tobacco Use   Smoking status: Never Smoker   Smokeless tobacco: Never Used   Tobacco comment: Tobacco use-no  Substance Use Topics   Alcohol use: No   Drug use: No    Review of Systems Constitutional: Fevers chills and fatigue Eyes: No visual changes. ENT: No sore throat. Some sinus congestion Cardiovascular: Denies chest pain but rather reports an achy feeling seems to be radiating from her right upper abdomen towards her right lower chest, no left-sided chest pain. Respiratory: Denies shortness of breath. Gastrointestinal: See HPI Genitourinary: Negative for dysuria. Musculoskeletal: Negative for back pain. Skin: Negative for rash. Neurological: Negative for headaches, areas of focal weakness or numbness.    ____________________________________________   PHYSICAL EXAM:  VITAL SIGNS: ED Triage Vitals  Enc Vitals Group     BP 07/01/20 1324 (!) 134/95     Pulse Rate 07/01/20 1324 (!) 125     Resp 07/01/20 1324 (!) 22     Temp 07/01/20 1324 (!) 100.6 F (38.1 C)     Temp Source 07/01/20 1324 Oral     SpO2 07/01/20 1324 96 %     Weight 07/01/20 1325 281 lb 1.4 oz (127.5 kg)     Height 07/01/20 1325 5\' 5"   (1.651 m)     Head Circumference --      Peak Flow --      Pain Score 07/01/20 1325 8     Pain Loc --      Pain Edu? --      Excl. in GC? --     Constitutional: Alert and oriented. Mildly ill-appearing, fatigued but in no acute distress. Eyes: Conjunctivae are normal. Head: Atraumatic. Nose: No congestion/rhinnorhea. Mouth/Throat: Mucous membranes are slightly dry. Neck: No stridor.  Cardiovascular: Minimally tachycardic rate, regular rhythm. Grossly normal heart sounds.  Good peripheral circulation. Respiratory: Normal respiratory effort.  No retractions. Lungs CTAB. Gastrointestinal: Soft and moderate tenderness focally in the right upper quadrant but not in obvious positive Murphy. No rebound or guarding in any other quadrant. No pain in remaining quadrants. No distention. Musculoskeletal: No lower extremity tenderness nor edema. Neurologic:  Normal speech and language. No gross focal neurologic deficits are appreciated.  Skin:  Skin is warm, dry and intact. No rash noted. Psychiatric: Mood and affect are normal. Speech and behavior are normal.  ____________________________________________   LABS (all labs ordered are listed, but only abnormal results are displayed)  Labs Reviewed  COMPREHENSIVE METABOLIC PANEL - Abnormal; Notable for the following components:      Result Value   Glucose, Bld 120 (*)    Creatinine, Ser 1.18 (*)    GFR calc non Af Amer 52 (*)    All other components within normal limits  CBC - Abnormal; Notable for the following components:   WBC 15.3 (*)    RBC 5.65 (*)    MCV 71.7 (*)    MCH 21.8 (*)    RDW 18.6 (*)    All other components within normal limits  URINALYSIS, COMPLETE (UACMP) WITH MICROSCOPIC - Abnormal; Notable for the following components:   Color, Urine YELLOW (*)    APPearance HAZY (*)    Glucose, UA >=500 (*)    Hgb urine dipstick SMALL (*)    Ketones, ur 20 (*)    Protein, ur 30 (*)    All other components  within normal limits    SARS CORONAVIRUS 2 BY RT PCR (HOSPITAL ORDER, PERFORMED IN [facility])  CULTURE, BLOOD (SINGLE)  URINE CULTURE  CULTURE, BLOOD (SINGLE)  LIPASE, BLOOD  LACTIC ACID, PLASMA  TROPONIN I (HIGH SENSITIVITY)  TROPONIN I (HIGH SENSITIVITY)   ____________________________________________  EKG  Reviewed interpreted 1339 heart rate 120 QRS 80 QTc 430 Sinus tachycardia ____________________________________________  RADIOLOGY  DG Chest 2 View  Result Date: 07/01/2020 CLINICAL DATA:  Chest pain EXAM: CHEST - 2 VIEW COMPARISON:  12/19/2014 chest radiograph. FINDINGS: Stable cardiomediastinal silhouette with normal heart size. No pneumothorax. No pleural effusion. No pulmonary edema. No acute consolidative airspace disease. Chronic bilateral perihilar reticular opacities and bronchiectasis, unchanged. IMPRESSION: 1. No acute cardiopulmonary disease. 2. Chronic bilateral perihilar reticular opacities and bronchiectasis. Electronically Signed   By: Delbert Phenix M.D.   On: 07/01/2020 14:27   CT Angio Chest PE W and/or Wo Contrast  Result Date: 07/01/2020 CLINICAL DATA:  Right-sided abdominal pain and fever EXAM: CT ANGIOGRAPHY CHEST WITH CONTRAST TECHNIQUE: Multidetector CT imaging of the chest was performed using the standard protocol during bolus administration of intravenous contrast. Multiplanar CT image reconstructions and MIPs were obtained to evaluate the vascular anatomy. CONTRAST:  OMNIPAQUE IOHEXOL 350 MG/ML SOLN COMPARISON:  Ultrasound same day, December 20, 2014 FINDINGS: Cardiovascular: There is a optimal opacification of the pulmonary arteries. There is no central,segmental, or subsegmental filling defects within the pulmonary arteries. The heart is normal in size. No pericardial effusion or thickening. No evidence right heart strain. There is normal three-vessel brachiocephalic anatomy without proximal stenosis. The thoracic aorta is normal in appearance. Mediastinum/Nodes:  No hilar, mediastinal, or axillary adenopathy. Thyroid gland, trachea, and esophagus demonstrate no significant findings. Lungs/Pleura: Again noted are areas of bronchiectasis with architectural distortion and cystic changes seen within both upper lobes, right middle lobe and right lung base. This is not significantly changed since the prior exam. No large airspace consolidation or pleural effusion. Upper Abdomen: No acute abnormalities present in the visualized portions of the upper abdomen. Musculoskeletal: No chest wall abnormality. No acute or significant osseous findings. Review of the MIP images confirms the above findings. Abdomen/pelvis: Hepatobiliary: There is diffuse low density seen throughout the liver parenchyma. No focal hepatic lesion is seen. No intra or extrahepatic biliary ductal dilatation. The main portal vein is patent. No evidence of calcified gallstones, gallbladder wall thickening or biliary dilatation. Pancreas: Unremarkable. No pancreatic ductal dilatation or surrounding inflammatory changes. Spleen: Normal in size without focal abnormality. Adrenals/Urinary Tract: Both adrenal glands appear normal. The kidneys and collecting system appear normal without evidence of urinary tract calculus or hydronephrosis. Bladder is unremarkable. Stomach/Bowel: The stomach and small bowel are normal in appearance. There is diffuse wall thickening involving the hepatic flexure and right colon extending to the cecum with surrounding mild fat stranding changes. There is scattered small lymph nodes seen within the right lower quadrant with mesenteric edema. There is surrounding fat stranding changes seen around the appendix, however it is nondilated. The remainder of the colon is unremarkable. No pericolonic loculated fluid collections or free air. Vascular/Lymphatic: There are no enlarged mesenteric, retroperitoneal, or pelvic lymph nodes. No significant vascular findings are present. Reproductive: The  uterus and adnexa are unremarkable. Other: No evidence of abdominal wall mass or hernia. Musculoskeletal: No acute or significant osseous findings. IMPRESSION: 1. No central, segmental, or subsegmental pulmonary embolism. 2. Stable chronic lung changes as on the prior exam dating back to 2016. This could be  due to chronic infectious or inflammatory process, or underlying COPD. 3. Hepatic steatosis 4. Findings consistent with colitis involving the hepatic flexure, right colon and cecum. No pericolonic loculated fluid collections or free air. 5. No definite evidence of appendicitis. Electronically Signed   By: Jonna Clark M.D.   On: 07/01/2020 23:25   CT ABDOMEN PELVIS W CONTRAST  Result Date: 07/01/2020 CLINICAL DATA:  Right-sided abdominal pain and fever EXAM: CT ANGIOGRAPHY CHEST WITH CONTRAST TECHNIQUE: Multidetector CT imaging of the chest was performed using the standard protocol during bolus administration of intravenous contrast. Multiplanar CT image reconstructions and MIPs were obtained to evaluate the vascular anatomy. CONTRAST:  OMNIPAQUE IOHEXOL 350 MG/ML SOLN COMPARISON:  Ultrasound same day, December 20, 2014 FINDINGS: Cardiovascular: There is a optimal opacification of the pulmonary arteries. There is no central,segmental, or subsegmental filling defects within the pulmonary arteries. The heart is normal in size. No pericardial effusion or thickening. No evidence right heart strain. There is normal three-vessel brachiocephalic anatomy without proximal stenosis. The thoracic aorta is normal in appearance. Mediastinum/Nodes: No hilar, mediastinal, or axillary adenopathy. Thyroid gland, trachea, and esophagus demonstrate no significant findings. Lungs/Pleura: Again noted are areas of bronchiectasis with architectural distortion and cystic changes seen within both upper lobes, right middle lobe and right lung base. This is not significantly changed since the prior exam. No large airspace  consolidation or pleural effusion. Upper Abdomen: No acute abnormalities present in the visualized portions of the upper abdomen. Musculoskeletal: No chest wall abnormality. No acute or significant osseous findings. Review of the MIP images confirms the above findings. Abdomen/pelvis: Hepatobiliary: There is diffuse low density seen throughout the liver parenchyma. No focal hepatic lesion is seen. No intra or extrahepatic biliary ductal dilatation. The main portal vein is patent. No evidence of calcified gallstones, gallbladder wall thickening or biliary dilatation. Pancreas: Unremarkable. No pancreatic ductal dilatation or surrounding inflammatory changes. Spleen: Normal in size without focal abnormality. Adrenals/Urinary Tract: Both adrenal glands appear normal. The kidneys and collecting system appear normal without evidence of urinary tract calculus or hydronephrosis. Bladder is unremarkable. Stomach/Bowel: The stomach and small bowel are normal in appearance. There is diffuse wall thickening involving the hepatic flexure and right colon extending to the cecum with surrounding mild fat stranding changes. There is scattered small lymph nodes seen within the right lower quadrant with mesenteric edema. There is surrounding fat stranding changes seen around the appendix, however it is nondilated. The remainder of the colon is unremarkable. No pericolonic loculated fluid collections or free air. Vascular/Lymphatic: There are no enlarged mesenteric, retroperitoneal, or pelvic lymph nodes. No significant vascular findings are present. Reproductive: The uterus and adnexa are unremarkable. Other: No evidence of abdominal wall mass or hernia. Musculoskeletal: No acute or significant osseous findings. IMPRESSION: 1. No central, segmental, or subsegmental pulmonary embolism. 2. Stable chronic lung changes as on the prior exam dating back to 2016. This could be due to chronic infectious or inflammatory process, or underlying  COPD. 3. Hepatic steatosis 4. Findings consistent with colitis involving the hepatic flexure, right colon and cecum. No pericolonic loculated fluid collections or free air. 5. No definite evidence of appendicitis. Electronically Signed   By: Jonna Clark M.D.   On: 07/01/2020 23:25   US ABDOMEN LIMITED RUQ  Result Date: 07/01/2020 CLINICAL DATA:  Right upper quadrant pain EXAM: ULTRASOUND ABDOMEN LIMITED RIGHT UPPER QUADRANT COMPARISON:  None. FINDINGS: Gallbladder: No gallstones or wall thickening visualized. No sonographic Murphy sign noted by sonographer. Common bile duct:  Diameter: Normal caliber, 4 mm Liver: Increased echotexture compatible with fatty infiltration. No focal abnormality or biliary ductal dilatation. Portal vein is patent on color Doppler imaging with normal direction of blood flow towards the liver. Other: None. IMPRESSION: Fatty infiltration of the liver. No acute findings. Electronically Signed   By: Charlett Nose M.D.   On: 07/01/2020 20:27    Imaging reviewed, notable for colitis involving the right colon and hepatic flexure/cecum.  No abscess denoted ____________________________________________   PROCEDURES  Procedure(s) performed: None  Procedures  Critical Care performed:   ____________________________________________   INITIAL IMPRESSION / ASSESSMENT AND PLAN / ED COURSE  Pertinent labs & imaging results that were available during my care of the patient were reviewed by me and considered in my medical decision making (see chart for details).   Differential diagnosis includes but is not limited to, abdominal perforation, aortic dissection, cholecystitis, appendicitis, diverticulitis, colitis, esophagitis/gastritis, kidney stone, pyelonephritis, urinary tract infection, aortic aneurysm. All are considered in decision and treatment plan. Based upon the patient's presentation and risk factors, we will proceed with u ltrasound, fluids hydration  antipyretic.   Patient reports a slight discomfort in her right lower chest but seems to be referred from the right upper quadrant.  Also reports infectious symptoms systemic symptoms occluding fevers chills  ----------------------------------------- 11:43 PM on 07/01/2020 -----------------------------------------  Imaging reviewed, appears consistent with colitis.  She feels much improved her heart rate is normalized, respiratory rate normal, fully alert in no distress. Non-toxic and she reports feeling much improved.  Discussed diagnosis of colitis with the patient, will treat with Cipro and Flagyl as appears to be infectious in nature.  Patient unable to provide stool sample in the ED.  Recommended follow-up with primary care as well as gastroenterology, patient and her husband both in agreement.  She feels well much improved comfortable with plan for careful return precautions.    Vitals:   07/01/20 2253 07/01/20 2353  BP:  110/64  Pulse: 89 91  Resp:  18  Temp:  99 F (37.2 C)  SpO2: 98% 97%        ____________________________________________   FINAL CLINICAL IMPRESSION(S) / ED DIAGNOSES  Final diagnoses:  Abdominal pain  Infectious colitis        Note:  This document was prepared using Dragon voice recognition software and may include unintentional dictation errors       Sharyn Creamer, MD 07/01/20 2358

## 2020-07-01 NOTE — ED Triage Notes (Signed)
Pt presents to ED via POV with c/o fever, generalized body aches, RUQ abdominal pain, R sided CP. Pt states symptoms started Friday night. Pt reports Tmax 102 at home. Pt reports her legs are also shaking at this time.

## 2020-07-01 NOTE — ED Provider Notes (Addendum)
Error - please see Dr. Lorenza Chick notes for patient evaluation and disposition. Discharged prior to my assumption of care.    Shaune Pollack, MD 07/01/20 2337

## 2020-07-01 NOTE — ED Notes (Signed)
Pt to CT

## 2020-07-02 LAB — URINE CULTURE

## 2020-07-02 NOTE — ED Notes (Signed)
Reviewed discharge instructions, follow-up care, and prescriptions with patient. Patient verbalized understanding of all information reviewed. Patient stable, with no distress noted at this time.    

## 2020-07-06 LAB — CULTURE, BLOOD (SINGLE)
Culture: NO GROWTH
Special Requests: ADEQUATE

## 2020-08-06 ENCOUNTER — Other Ambulatory Visit: Payer: BC Managed Care – PPO

## 2020-08-06 ENCOUNTER — Other Ambulatory Visit: Payer: Self-pay | Admitting: Critical Care Medicine

## 2020-08-06 DIAGNOSIS — Z20822 Contact with and (suspected) exposure to covid-19: Secondary | ICD-10-CM

## 2020-08-07 LAB — NOVEL CORONAVIRUS, NAA: SARS-CoV-2, NAA: NOT DETECTED

## 2020-08-07 LAB — SARS-COV-2, NAA 2 DAY TAT

## 2020-08-10 ENCOUNTER — Ambulatory Visit
Admission: EM | Admit: 2020-08-10 | Discharge: 2020-08-10 | Disposition: A | Discharge status: Home / Self Care | Payer: BC Managed Care – PPO | Attending: Family Medicine | Admitting: Family Medicine

## 2020-08-10 ENCOUNTER — Other Ambulatory Visit: Payer: Self-pay

## 2020-08-10 DIAGNOSIS — J0110 Acute frontal sinusitis, unspecified: Secondary | ICD-10-CM | POA: Diagnosis not present

## 2020-08-10 MED ORDER — DOXYCYCLINE HYCLATE 100 MG PO CAPS
100.0000 mg | ORAL_CAPSULE | Freq: Two times a day (BID) | ORAL | 0 refills | Status: DC
Start: 2020-08-10 — End: 2023-02-12

## 2020-08-10 NOTE — Discharge Instructions (Signed)
I have sent in doxycycline for you to take twice a day for a week  Follow up with this office or with primary care as needed

## 2020-08-10 NOTE — ED Provider Notes (Signed)
[facility] CARE CENTER   [id number] 08/10/20 Arrival Time: 1425  AO:ZHYQ THROAT  SUBJECTIVE: History from: patient.  Rachel Walls is a 54 y.o. female who presents with abrupt onset of nasal congestion, headache, fatigue for the last 6 days. Denies sick exposure to Covid, strep, flu or mono, or precipitating event. Has negative history of Covid. Has had Covid vaccines. Has attempted OTC cold and flu with no relief. Reports that she has taken 2 days worth of doxycycline that she had at home and states that she is feeling better. There are no aggravating symptoms. Denies previous symptoms in the past.     Denies fever, chills, ear pain, rhinorrhea, cough, SOB, wheezing, chest pain, nausea, rash, changes in bowel or bladder habits.     ROS: As per HPI.  All other pertinent ROS negative.     Past Medical History:  Diagnosis Date  . Allergic rhinitis, cause unspecified   . Anemia   . Anxiety   . Arthritis   . Asthma   . Chronic kidney disease   . Diabetes mellitus    Type 2  . GERD (gastroesophageal reflux disease)   . Hypertension   . Hypertensive retinopathy   . Hypothyroidism   . Nonspecific elevation of levels of transaminase or lactic acid dehydrogenase (LDH)   . Obesity, unspecified   . Other and unspecified hyperlipidemia   . Psychic factors associated with disease    Psychogenic factors with other diseases  . Unspecified vitamin D deficiency   . Venous engorgement of retina   . Wegener's granulomatosis (HCC)    Past Surgical History:  Procedure Laterality Date  . abdominal ultrasound  05/17/2012   severe hepatomegaly at 22 cm; gallbladder normal.   [facility].  . Calcaneous bone spur surgery     x2  . CT abdomen  06/10/12   Hepatomegaly.  [facility].  [facility] Kitchen ESOPHAGOGASTRODUODENOSCOPY  08/19/12   diffuse gastritis antrum; pathology negative for malignancy, H. Pylori; esophagus and duodenum normal.  . hida scan  08/27/12   normal; EF 62%.  [facility].  Marland Kitchen LUNG BIOPSY  1999  .  Meniscus tear     L; x 3 tears.  [facility].  Marland Kitchen RENAL BIOPSY  1999  . 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 TONSILLECTOMY     Allergies  Allergen Reactions  . Penicillins Hives  . Sulfonamide Derivatives     Reaction: arthralgias.   No current facility-administered medications on file prior to encounter.   Current Outpatient Medications on File Prior to Encounter  Medication Sig Dispense Refill  . albuterol (PROVENTIL HFA;VENTOLIN HFA) 108 (90 BASE) MCG/ACT inhaler Inhale 90 mcg into the lungs every 4 (four) hours as needed.    Marland Kitchen atorvastatin (LIPITOR) 10 MG tablet Take 1 tablet (10 mg total) by mouth daily. 90 tablet 3  . celecoxib (CELEBREX) 200 MG capsule Take 1 capsule (200 mg total) by mouth daily. 90 capsule 1  . ciprofloxacin (CIPRO) 500 MG tablet Take 1 tablet (500 mg total) by mouth 2 (two) times daily. 20 tablet 0  . DULoxetine (CYMBALTA) 60 MG capsule Take 1 capsule (60 mg total) by mouth 2 (two) times daily. 180 capsule 3  . empagliflozin (JARDIANCE) 25 MG TABS tablet Take 25 mg by mouth daily. 90 tablet 3  . fluticasone (FLONASE) 50 MCG/ACT nasal spray Place 2 sprays into both nostrils daily. 48 g 3  . gabapentin (NEURONTIN) 800 MG tablet Take 1 tablet (800 mg  total) by mouth 3 (three) times daily. 270 tablet 1  . glucose blood (ONE TOUCH ULTRA TEST) test strip Use as instructed--check sugar 3 TIMES A DAY. 100 each 6  . Insulin Glargine (BASAGLAR KWIKPEN) 100 UNIT/ML SOPN Inject 0.64 mLs (64 Units total) into the skin daily at 10 pm. 30 mL 11  . Insulin Pen Needle (PEN NEEDLES) 32G X 4 MM MISC Inject 1 each into the skin daily. (DX: E11.9) 100 each 4  . lansoprazole (PREVACID) 30 MG capsule Take 1 capsule (30 mg total) by mouth daily. 90 capsule 3  . levofloxacin (LEVAQUIN) 750 MG tablet Take by mouth.    . levothyroxine (SYNTHROID, LEVOTHROID) 100 MCG tablet Take 1 tablet (100 mcg total) by mouth daily. 90 tablet 3  . loratadine (CLARITIN) 10 MG  tablet Take by mouth.    . metFORMIN (GLUCOPHAGE) 500 MG tablet Take 1 tablet (500 mg total) by mouth 2 (two) times daily with a meal. With food. 180 tablet 3  . metroNIDAZOLE (FLAGYL) 500 MG tablet Take 1 tablet (500 mg total) by mouth 3 (three) times daily. 30 tablet 0  . mycophenolate (CELLCEPT) 250 MG capsule Take 250 mg by mouth at bedtime. 2 tab in the morning & 1 tab at night.    . ondansetron (ZOFRAN ODT) 4 MG disintegrating tablet Take 1 tablet (4 mg total) by mouth every 6 (six) hours as needed for nausea or vomiting. 20 tablet 0  . ramipril (ALTACE) 5 MG capsule Take 1 capsule (5 mg total) by mouth daily. 90 capsule 3  . sitaGLIPtin (JANUVIA) 50 MG tablet 100 mg daily.    Marland Kitchen. trimethoprim-polymyxin b (POLYTRIM) ophthalmic solution Place 1 drop into the right eye every 6 (six) hours. For three to five days 10 mL 0  . Vitamin D, Ergocalciferol, (DRISDOL) 50000 units CAPS capsule Take 1 capsule (50,000 Units total) by mouth every 7 (seven) days. 12 capsule 0   Social History   Socioeconomic History  . Marital status: Married    Spouse name: Renella CunasShawn Kirkman  . Number of children: 0  . Years of education: college  . Highest education level: Not on file  Occupational History  . Occupation: TEACHER    Employer: [employer]    Comment: Full time  Tobacco Use  . Smoking status: Never Smoker  . Smokeless tobacco: Never Used  . Tobacco comment: Tobacco use-no  Substance and Sexual Activity  . Alcohol use: No  . Drug use: No  . Sexual activity: Yes    Partners: Male    Birth control/protection: Post-menopausal  Other Topics Concern  . Not on file  Social History Narrative   Marital status:  Married x 27 years;happily married; no abuse.      Children: none      Lives: with husband, 3 cats, 1 dog.      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      Tobacco: never      Alcohol: never      Drugs: none      Exercise: walking in 2019.       Seatbelt: 100%; no texting while driving.      Guns: none          Social Determinants of Corporate investment bankerHealth   Financial Resource Strain:   . Difficulty of Paying Living Expenses: Not on file  Food Insecurity:   . Worried About Programme researcher, broadcasting/film/videounning Out of Food in the Last Year:  Not on file  . Ran Out of Food in the Last Year: Not on file  Transportation Needs:   . Lack of Transportation (Medical): Not on file  . Lack of Transportation (Non-Medical): Not on file  Physical Activity:   . Days of Exercise per Week: Not on file  . Minutes of Exercise per Session: Not on file  Stress:   . Feeling of Stress : Not on file  Social Connections:   . Frequency of Communication with Friends and Family: Not on file  . Frequency of Social Gatherings with Friends and Family: Not on file  . Attends Religious Services: Not on file  . Active Member of Clubs or Organizations: Not on file  . Attends Banker Meetings: Not on file  . Marital Status: Not on file  Intimate Partner Violence:   . Fear of Current or Ex-Partner: Not on file  . Emotionally Abused: Not on file  . Physically Abused: Not on file  . Sexually Abused: Not on file   Family History  Problem Relation Age of Onset  . Hyperlipidemia Mother        Hypercholesterolemia  . Arthritis Mother        OA  . Sarcoidosis Father   . Heart disease Maternal Grandfather   . Emphysema Maternal Grandfather        Cause of death  . Diabetes Maternal Grandfather   . Heart failure Paternal Grandfather        CHF  . Heart disease Paternal Grandfather   . Asthma Brother   . Alcohol abuse Brother   . Emphysema Maternal Grandmother   . Breast cancer Neg Hx     OBJECTIVE:  Vitals:   08/10/20 1505  BP: 117/83  Pulse: 100  Resp: 16  Temp: 99 F (37.2 C)  SpO2: 95%     General appearance: alert; appears fatigued, but nontoxic, speaking in full sentences and managing own secretions HEENT: NCAT; Ears: EACs clear, TMs pearly gray with visible cone  of light, without erythema; Eyes: PERRL, EOMI grossly; Nose: no obvious rhinorrhea; Throat: oropharynx clear, tonsils 1+ and mildly erythematous without white tonsillar exudates, uvula midline; Sinuses: tender to palpation Neck: supple without LAD Lungs: CTA bilaterally without adventitious breath sounds; cough absent Heart: regular rate and rhythm.  Radial pulses 2+ symmetrical bilaterally Skin: warm and dry Psychological: alert and cooperative; normal mood and affect  LABS: No results found for this or any previous visit (from the past 24 hour(s)).   ASSESSMENT & PLAN:  1. Acute non-recurrent frontal sinusitis     Meds ordered this encounter  Medications  . doxycycline (VIBRAMYCIN) 100 MG capsule    Sig: Take 1 capsule (100 mg total) by mouth 2 (two) times daily.    Dispense:  14 capsule    Refill:  0    Order Specific Question:   Supervising Provider    Answer:   Merrilee Jansky [id number]    Acute Sinusitis Push fluids and get rest Prescribed doxycycline Take as directed and to completion.  Drink warm or cool liquids, use throat lozenges, or popsicles to help alleviate symptoms Take OTC ibuprofen or tylenol as needed for pain May use Zyrtec D and flonase to help alleviate symptoms Follow up with PCP if symptoms persist Return or go to ER if you have any new or worsening symptoms such as fever, chills, nausea, vomiting, worsening sore throat, cough, abdominal pain, chest pain, changes in bowel or bladder habits.   Reviewed  expectations re: course of current medical issues. Questions answered. Outlined signs and symptoms indicating need for more acute intervention. Patient verbalized understanding. After Visit Summary given.          Moshe Cipro, NP 08/10/20 1523

## 2020-08-10 NOTE — ED Triage Notes (Signed)
Patient reports sinus pressure, cough, congestion, and sore throat x5 days. 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 Patient was COVID swabbed through [facility], which was negative.

## 2020-10-17 ENCOUNTER — Other Ambulatory Visit: Payer: Self-pay | Admitting: Family Medicine

## 2020-10-17 DIAGNOSIS — Z1231 Encounter for screening mammogram for malignant neoplasm of breast: Secondary | ICD-10-CM

## 2021-06-19 ENCOUNTER — Encounter: Payer: Self-pay | Admitting: *Deleted

## 2022-01-02 ENCOUNTER — Other Ambulatory Visit: Payer: Self-pay | Admitting: Family Medicine

## 2022-01-02 DIAGNOSIS — Z1231 Encounter for screening mammogram for malignant neoplasm of breast: Secondary | ICD-10-CM

## 2022-11-25 ENCOUNTER — Encounter: Payer: Self-pay | Admitting: Family Medicine

## 2023-02-12 ENCOUNTER — Other Ambulatory Visit
Admission: RE | Admit: 2023-02-12 | Discharge: 2023-02-12 | Disposition: A | Discharge status: Home / Self Care | Payer: BC Managed Care – PPO | Source: Ambulatory Visit | Attending: Student | Admitting: Student

## 2023-02-12 ENCOUNTER — Emergency Department
Admission: EM | Admit: 2023-02-12 | Discharge: 2023-02-12 | Disposition: A | Discharge status: Home / Self Care | Payer: BC Managed Care – PPO | Attending: Emergency Medicine | Admitting: Emergency Medicine

## 2023-02-12 ENCOUNTER — Other Ambulatory Visit: Payer: Self-pay

## 2023-02-12 ENCOUNTER — Emergency Department: Payer: BC Managed Care – PPO

## 2023-02-12 DIAGNOSIS — R1031 Right lower quadrant pain: Secondary | ICD-10-CM | POA: Diagnosis present

## 2023-02-12 DIAGNOSIS — E119 Type 2 diabetes mellitus without complications: Secondary | ICD-10-CM | POA: Insufficient documentation

## 2023-02-12 DIAGNOSIS — N390 Urinary tract infection, site not specified: Secondary | ICD-10-CM | POA: Diagnosis not present

## 2023-02-12 DIAGNOSIS — R079 Chest pain, unspecified: Secondary | ICD-10-CM | POA: Insufficient documentation

## 2023-02-12 DIAGNOSIS — E039 Hypothyroidism, unspecified: Secondary | ICD-10-CM | POA: Diagnosis not present

## 2023-02-12 DIAGNOSIS — I10 Essential (primary) hypertension: Secondary | ICD-10-CM | POA: Diagnosis not present

## 2023-02-12 DIAGNOSIS — D72829 Elevated white blood cell count, unspecified: Secondary | ICD-10-CM | POA: Insufficient documentation

## 2023-02-12 LAB — URINALYSIS, ROUTINE W REFLEX MICROSCOPIC
Bilirubin Urine: NEGATIVE
Glucose, UA: NEGATIVE mg/dL
Ketones, ur: NEGATIVE mg/dL
Nitrite: NEGATIVE
Protein, ur: NEGATIVE mg/dL
Specific Gravity, Urine: <1.005 — ABNORMAL LOW (ref 1.005–1.030)
pH: 5.5 (ref 5.0–8.0)

## 2023-02-12 LAB — COMPREHENSIVE METABOLIC PANEL
ALT: 18 U/L (ref 0–44)
AST: 13 U/L — ABNORMAL LOW (ref 15–41)
Albumin: 3.9 g/dL (ref 3.5–5.0)
Alkaline Phosphatase: 78 U/L (ref 38–126)
Anion gap: 13 (ref 5–15)
BUN: 25 mg/dL — ABNORMAL HIGH (ref 6–20)
CO2: 28 mmol/L (ref 22–32)
Calcium: 8.9 mg/dL (ref 8.9–10.3)
Chloride: 98 mmol/L (ref 98–111)
Creatinine, Ser: 0.87 mg/dL (ref 0.44–1.00)
GFR, Estimated: >60 mL/min (ref >60)
Glucose, Bld: 133 mg/dL — ABNORMAL HIGH (ref 70–99)
Potassium: 3.3 mmol/L — ABNORMAL LOW (ref 3.5–5.1)
Sodium: 139 mmol/L (ref 135–145)
Total Bilirubin: 0.7 mg/dL (ref 0.3–1.2)
Total Protein: 7.1 g/dL (ref 6.5–8.1)

## 2023-02-12 LAB — CBC
HCT: 46.6 % — ABNORMAL HIGH (ref 36.0–46.0)
Hemoglobin: 14.4 g/dL (ref 12.0–15.0)
MCH: 25.6 pg — ABNORMAL LOW (ref 26.0–34.0)
MCHC: 30.9 g/dL (ref 30.0–36.0)
MCV: 82.8 fL (ref 80.0–100.0)
Platelets: 401 10*3/uL — ABNORMAL HIGH (ref 150–400)
RBC: 5.63 MIL/uL — ABNORMAL HIGH (ref 3.87–5.11)
RDW: 14 % (ref 11.5–15.5)
WBC: 16.6 10*3/uL — ABNORMAL HIGH (ref 4.0–10.5)
nRBC: 0 % (ref 0.0–0.2)

## 2023-02-12 LAB — URINALYSIS, MICROSCOPIC (REFLEX)

## 2023-02-12 LAB — TROPONIN I (HIGH SENSITIVITY): Troponin I (High Sensitivity): 5 ng/L (ref <18)

## 2023-02-12 LAB — LIPASE, BLOOD: Lipase: 33 U/L (ref 11–51)

## 2023-02-12 MED ORDER — ACETAMINOPHEN 325 MG PO TABS
ORAL_TABLET | ORAL | Status: AC
  Filled 2023-02-12: qty 2

## 2023-02-12 MED ORDER — ACETAMINOPHEN 325 MG PO TABS
650.0000 mg | ORAL_TABLET | Freq: Once | ORAL | Status: AC
  Administered 2023-02-12: 650 mg via ORAL

## 2023-02-12 MED ORDER — CEFDINIR 300 MG PO CAPS: 300.0000 mg | ORAL_CAPSULE | Freq: Two times a day (BID) | ORAL | 0 refills | Status: AC

## 2023-02-12 MED ORDER — IOHEXOL 300 MG/ML  SOLN
100.0000 mL | Freq: Once | INTRAMUSCULAR | Status: AC | PRN
  Administered 2023-02-12: 100 mL via INTRAVENOUS

## 2023-02-12 NOTE — ED Provider Notes (Signed)
[facility] Provider Note    Event Date/Time   First MD Initiated Contact with Patient 02/12/23 1427     (approximate)   History   Abdominal Pain   HPI  Rachel Walls is a 57 y.o. female with history of diabetes, Wegener's granulomatosis, hypothyroidism, hypertension, presents emergency department with abdominal pain.  Patient states she has had right lower quadrant pain been on 2 antibiotics recently, doxycycline and possibly Levaquin.  She went to [place] [facility] and they sent her here for further assessment.  She denies fever or chills      Physical Exam   Triage Vital Signs: ED Triage Vitals  Enc Vitals Group     BP 02/12/23 1241 124/88     Pulse Rate 02/12/23 1241 95     Resp 02/12/23 1241 18     Temp 02/12/23 1241 98.2 F (36.8 C)     Temp Source 02/12/23 1241 Oral     SpO2 02/12/23 1241 100 %     Weight 02/12/23 1242 281 lb 1.4 oz (127.5 kg)     Height 02/12/23 1242 5\' 5"  (1.651 m)     Head Circumference --      Peak Flow --      Pain Score 02/12/23 1242 7     Pain Loc --      Pain Edu? --      Excl. in [place]? --     Most recent vital signs: Vitals:   02/12/23 1241  BP: 124/88  Pulse: 95  Resp: 18  Temp: 98.2 F (36.8 C)  SpO2: 100%     General: Awake, no distress.   CV:  Good peripheral perfusion. regular rate and  rhythm Resp:  Normal effort. Lungs cta Abd:  No distention.   Other:     ED Results / Procedures / Treatments   Labs (all labs ordered are listed, but only abnormal results are displayed) Labs Reviewed  COMPREHENSIVE METABOLIC PANEL - Abnormal; Notable for the following components:      Result Value   Potassium 3.3 (*)    Glucose, Bld 133 (*)    BUN 25 (*)    AST 13 (*)    All other components within normal limits  CBC - Abnormal; Notable for the following components:   WBC 16.6 (*)    RBC 5.63 (*)    HCT 46.6 (*)    MCH 25.6 (*)    Platelets 401 (*)    All other components within normal  limits  URINALYSIS, ROUTINE W REFLEX MICROSCOPIC - Abnormal; Notable for the following components:   Specific Gravity, Urine <1.005 (*)    Hgb urine dipstick TRACE (*)    Leukocytes,Ua TRACE (*)    All other components within normal limits  URINALYSIS, MICROSCOPIC (REFLEX) - Abnormal; Notable for the following components:   Bacteria, UA RARE (*)    All other components within normal limits  URINE CULTURE  LIPASE, BLOOD     EKG     RADIOLOGY CT abdomen pelvis with IV contrast Ultrasound right upper quadrant    PROCEDURES:   Procedures   MEDICATIONS ORDERED IN ED: Medications  acetaminophen (TYLENOL) 325 MG tablet (  Not Given 02/12/23 1754)  iohexol (OMNIPAQUE) 300 MG/ML solution 100 mL (100 mLs Intravenous Contrast Given 02/12/23 1521)  acetaminophen (TYLENOL) tablet 650 mg (650 mg Oral Given 02/12/23 1758)     IMPRESSION / MDM / ASSESSMENT AND PLAN / ED COURSE  I reviewed the  triage vital signs and the nursing notes.                              Differential diagnosis includes, but is not limited to, appendicitis, acute cholecystitis, kidney stone, acute pyelonephritis  Patient's presentation is most consistent with acute presentation with potential threat to life or bodily function.   CBC is indicative of infection with a WBC of 16.6 remainder of labs are reassuring  CT abdomen/pelvis IV contrast, I did review the radiologist report, I interpret this as being negative for acute appendicitis.  No pyelonephritis  Ultrasound right upper quadrant to assess for acute cholecystitis, ultrasound was independently reviewed and interpreted by me as being negative for any acute abnormality  UA from [facility] clinic showed a large amount of leuks, her UA is showing a trace, will start the patient on Omnicef and do a urine culture.  She is in agreement treatment plan at this time.  Did advise her to finish her steroid pack.  She is in agreement.  Discharged in stable  condition.  Strict instructions to return if worsening.     FINAL CLINICAL IMPRESSION(S) / ED DIAGNOSES   Final diagnoses:  Right lower quadrant abdominal pain  Urinary tract infection without hematuria, site unspecified     Rx / DC Orders   ED Discharge Orders          Ordered    cefdinir (OMNICEF) 300 MG capsule  2 times daily        02/12/23 1751             Note:  This document was prepared using Dragon voice recognition software and may include unintentional dictation errors.    Versie Starks, PA-C 02/12/23 1916    Carrie Mew, MD 02/12/23 [phone]

## 2023-02-12 NOTE — ED Triage Notes (Signed)
Pt here with right side abd pain. Pt states she is here for a CT of her abd. Pt states she had labs done at [facility]. Pt having nausea and also has some diarrhea.

## 2023-02-12 NOTE — ED Notes (Signed)
Pt urinalysis is available on CareEverywhere.

## 2023-02-12 NOTE — Discharge Instructions (Addendum)
Follow-up with your regular doctor if not improving in 2 days.  Return emergency department worsening.  Your CT and ultrasound are both negative for acute cholecystitis or acute appendicitis.  We do not see any infection.

## 2023-02-13 LAB — POC URINE PREG, ED: Preg Test, Ur: NEGATIVE

## 2023-02-15 LAB — URINE CULTURE: Culture: >=100000 — AB

## 2023-02-16 NOTE — Progress Notes (Signed)
ED Antimicrobial Stewardship Positive Culture Follow Up   Rachel Walls is an 57 y.o. female who presented to [facility] on 02/12/2023 with a chief complaint of  Chief Complaint  Patient presents with   Abdominal Pain    Recent Results (from the past 720 hour(s))  Urine Culture     Status: Abnormal   Collection Time: 02/12/23 12:44 PM   Specimen: Urine, Clean Catch  Result Value Ref Range Status   Specimen Description   Final    URINE, CLEAN CATCH Performed at [facility], [address], [place], [place] [address]    Special Requests   Final    NONE Performed at [facility], [address] [place], [place] [address]    Culture (A)  Final    >=100,000 COLONIES/mL ENTEROCOCCUS FAECALIS 90,000 COLONIES/mL DIPHTHEROIDS(CORYNEBACTERIUM SPECIES) Standardized susceptibility testing for this organism is not available. Performed at [facility], [place] [address] [place], [place] [address]    Report Status 02/15/2023 FINAL  Final   Organism ID, Bacteria ENTEROCOCCUS FAECALIS (A)  Final      Susceptibility   Enterococcus faecalis - MIC*    AMPICILLIN <=2 SENSITIVE Sensitive     NITROFURANTOIN <=16 SENSITIVE Sensitive     VANCOMYCIN 1 SENSITIVE Sensitive     * >=100,000 COLONIES/mL ENTEROCOCCUS FAECALIS    [x]  Treated with cefdinir, organism resistant to prescribed antimicrobial []  Patient discharged originally without antimicrobial agent and treatment is now indicated  New antibiotic prescription: Macrobid 100 mg po BID x 5 days  ED Provider: Dr. Jari Pigg  3/25: Script called into [facility].  Spoke with patient and answered questions.    Lorin Picket ,PharmD Clinical Pharmacist  02/16/2023, 11:09 AM

## 2023-04-28 ENCOUNTER — Other Ambulatory Visit: Payer: Self-pay | Admitting: Family Medicine

## 2023-04-28 DIAGNOSIS — R102 Pelvic and perineal pain: Secondary | ICD-10-CM

## 2023-06-25 ENCOUNTER — Other Ambulatory Visit: Payer: Self-pay | Admitting: Family Medicine

## 2023-06-25 DIAGNOSIS — R102 Pelvic and perineal pain: Secondary | ICD-10-CM

## 2023-06-30 ENCOUNTER — Other Ambulatory Visit: Payer: Self-pay | Admitting: Family Medicine

## 2023-06-30 DIAGNOSIS — Z1231 Encounter for screening mammogram for malignant neoplasm of breast: Secondary | ICD-10-CM

## 2023-07-01 ENCOUNTER — Ambulatory Visit
Admission: RE | Admit: 2023-07-01 | Discharge: 2023-07-01 | Disposition: A | Discharge status: Home / Self Care | Payer: BC Managed Care – PPO | Source: Ambulatory Visit | Attending: Family Medicine | Admitting: Family Medicine

## 2023-07-01 DIAGNOSIS — Z1231 Encounter for screening mammogram for malignant neoplasm of breast: Secondary | ICD-10-CM | POA: Diagnosis not present

## 2023-07-09 ENCOUNTER — Ambulatory Visit
Admission: RE | Admit: 2023-07-09 | Discharge: 2023-07-09 | Disposition: A | Discharge status: Home / Self Care | Payer: BC Managed Care – PPO | Source: Ambulatory Visit | Attending: Family Medicine | Admitting: Family Medicine

## 2023-07-09 DIAGNOSIS — R102 Pelvic and perineal pain: Secondary | ICD-10-CM | POA: Insufficient documentation

## 2023-12-03 ENCOUNTER — Ambulatory Visit: Admission: RE | Admit: 2023-12-03 | Payer: Self-pay | Source: Home / Self Care | Admitting: Gastroenterology

## 2023-12-03 SURGERY — COLONOSCOPY WITH PROPOFOL
Anesthesia: General

## 2024-08-31 ENCOUNTER — Other Ambulatory Visit: Payer: Self-pay | Admitting: Family Medicine

## 2024-08-31 DIAGNOSIS — Z1231 Encounter for screening mammogram for malignant neoplasm of breast: Secondary | ICD-10-CM

## 2024-09-14 ENCOUNTER — Ambulatory Visit
Admission: RE | Admit: 2024-09-14 | Discharge: 2024-09-14 | Disposition: A | Discharge status: Home / Self Care | Payer: Self-pay | Source: Ambulatory Visit | Attending: Family Medicine | Admitting: Family Medicine

## 2024-09-14 DIAGNOSIS — Z1231 Encounter for screening mammogram for malignant neoplasm of breast: Secondary | ICD-10-CM | POA: Diagnosis present
